# Patient Record
Sex: Male | Born: 1935 | Race: White | Hispanic: No | Marital: Married | Smoking: Never smoker
Health system: Southern US, Community
[De-identification: ages and names within clinical notes are randomized; demographics above are authoritative.]

## PROBLEM LIST (undated history)

## (undated) DIAGNOSIS — R001 Bradycardia, unspecified: Secondary | ICD-10-CM

## (undated) DIAGNOSIS — I2510 Atherosclerotic heart disease of native coronary artery without angina pectoris: Secondary | ICD-10-CM

## (undated) DIAGNOSIS — F329 Major depressive disorder, single episode, unspecified: Secondary | ICD-10-CM

## (undated) DIAGNOSIS — R7303 Prediabetes: Secondary | ICD-10-CM

## (undated) DIAGNOSIS — I739 Peripheral vascular disease, unspecified: Secondary | ICD-10-CM

## (undated) DIAGNOSIS — M1990 Unspecified osteoarthritis, unspecified site: Secondary | ICD-10-CM

## (undated) DIAGNOSIS — K219 Gastro-esophageal reflux disease without esophagitis: Secondary | ICD-10-CM

## (undated) DIAGNOSIS — N400 Enlarged prostate without lower urinary tract symptoms: Secondary | ICD-10-CM

## (undated) DIAGNOSIS — E785 Hyperlipidemia, unspecified: Secondary | ICD-10-CM

## (undated) DIAGNOSIS — I779 Disorder of arteries and arterioles, unspecified: Secondary | ICD-10-CM

## (undated) DIAGNOSIS — I209 Angina pectoris, unspecified: Secondary | ICD-10-CM

## (undated) DIAGNOSIS — F32A Depression, unspecified: Secondary | ICD-10-CM

## (undated) DIAGNOSIS — IMO0001 Reserved for inherently not codable concepts without codable children: Secondary | ICD-10-CM

## (undated) DIAGNOSIS — I10 Essential (primary) hypertension: Secondary | ICD-10-CM

## (undated) DIAGNOSIS — H269 Unspecified cataract: Secondary | ICD-10-CM

## (undated) DIAGNOSIS — F320 Major depressive disorder, single episode, mild: Secondary | ICD-10-CM

## (undated) HISTORY — PX: TONSILLECTOMY AND ADENOIDECTOMY: SUR1326

## (undated) HISTORY — DX: Disorder of arteries and arterioles, unspecified: I77.9

## (undated) HISTORY — DX: Benign prostatic hyperplasia without lower urinary tract symptoms: N40.0

## (undated) HISTORY — DX: Hyperlipidemia, unspecified: E78.5

## (undated) HISTORY — PX: BACK SURGERY: SHX140

## (undated) HISTORY — PX: EYE SURGERY: SHX253

## (undated) HISTORY — DX: Atherosclerotic heart disease of native coronary artery without angina pectoris: I25.10

## (undated) HISTORY — DX: Major depressive disorder, single episode, unspecified: F32.9

## (undated) HISTORY — PX: CAROTID STENT: SHX1301

## (undated) HISTORY — DX: Bradycardia, unspecified: R00.1

## (undated) HISTORY — DX: Peripheral vascular disease, unspecified: I73.9

## (undated) HISTORY — PX: CORONARY ANGIOPLASTY: SHX604

## (undated) HISTORY — PX: CORONARY STENT PLACEMENT: SHX1402

## (undated) HISTORY — DX: Major depressive disorder, single episode, mild: F32.0

## (undated) HISTORY — DX: Unspecified osteoarthritis, unspecified site: M19.90

## (undated) HISTORY — PX: SHOULDER ARTHROSCOPY: SHX128

---
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
## 1959-12-09 HISTORY — PX: PILONIDAL CYST EXCISION: SHX744

## 1971-12-09 HISTORY — PX: LUMBAR LAMINECTOMY: SHX95

## 1983-12-09 HISTORY — PX: INGUINAL HERNIA REPAIR: SUR1180

## 1987-12-09 HISTORY — PX: CERVICAL DISCECTOMY: SHX98

## 1993-12-08 HISTORY — PX: TRANSURETHRAL RESECTION OF PROSTATE: SHX73

## 1998-05-15 ENCOUNTER — Ambulatory Visit (HOSPITAL_COMMUNITY): Admission: RE | Admit: 1998-05-15 | Discharge: 1998-05-15 | Payer: Self-pay | Admitting: Gastroenterology

## 1998-05-17 ENCOUNTER — Ambulatory Visit (HOSPITAL_COMMUNITY): Admission: RE | Admit: 1998-05-17 | Discharge: 1998-05-17 | Payer: Self-pay | Admitting: Gastroenterology

## 1999-09-23 ENCOUNTER — Encounter: Admission: RE | Admit: 1999-09-23 | Discharge: 1999-09-23 | Payer: Self-pay | Admitting: Gastroenterology

## 1999-12-08 ENCOUNTER — Ambulatory Visit (HOSPITAL_COMMUNITY): Admission: RE | Admit: 1999-12-08 | Discharge: 1999-12-08 | Payer: Self-pay | Admitting: *Deleted

## 2000-12-08 HISTORY — PX: HERNIA REPAIR: SHX51

## 2000-12-08 HISTORY — PX: CHOLECYSTECTOMY: SHX55

## 2000-12-29 ENCOUNTER — Encounter: Payer: Self-pay | Admitting: Surgery

## 2000-12-29 ENCOUNTER — Encounter: Admission: RE | Admit: 2000-12-29 | Discharge: 2000-12-29 | Payer: Self-pay | Admitting: Surgery

## 2001-02-10 ENCOUNTER — Encounter (INDEPENDENT_AMBULATORY_CARE_PROVIDER_SITE_OTHER): Payer: Self-pay | Admitting: Specialist

## 2001-02-10 ENCOUNTER — Encounter: Payer: Self-pay | Admitting: Surgery

## 2001-02-10 ENCOUNTER — Observation Stay (HOSPITAL_COMMUNITY): Admission: RE | Admit: 2001-02-10 | Discharge: 2001-02-11 | Payer: Self-pay | Admitting: Surgery

## 2001-05-21 ENCOUNTER — Encounter (INDEPENDENT_AMBULATORY_CARE_PROVIDER_SITE_OTHER): Payer: Self-pay | Admitting: Specialist

## 2001-05-21 ENCOUNTER — Other Ambulatory Visit: Admission: RE | Admit: 2001-05-21 | Discharge: 2001-05-21 | Payer: Self-pay | Admitting: Urology

## 2002-08-26 ENCOUNTER — Inpatient Hospital Stay (HOSPITAL_COMMUNITY): Admission: EM | Admit: 2002-08-26 | Discharge: 2002-08-30 | Payer: Self-pay | Admitting: Cardiology

## 2002-08-27 ENCOUNTER — Encounter: Payer: Self-pay | Admitting: Cardiology

## 2002-09-12 ENCOUNTER — Encounter (HOSPITAL_COMMUNITY): Admission: RE | Admit: 2002-09-12 | Discharge: 2002-12-11 | Payer: Self-pay | Admitting: Cardiology

## 2005-09-30 ENCOUNTER — Inpatient Hospital Stay (HOSPITAL_COMMUNITY): Admission: AD | Admit: 2005-09-30 | Discharge: 2005-10-02 | Payer: Self-pay | Admitting: Cardiology

## 2005-10-01 ENCOUNTER — Encounter: Payer: Self-pay | Admitting: Cardiology

## 2006-07-06 ENCOUNTER — Emergency Department (HOSPITAL_COMMUNITY): Admission: EM | Admit: 2006-07-06 | Discharge: 2006-07-06 | Payer: Self-pay | Admitting: *Deleted

## 2007-02-12 ENCOUNTER — Encounter: Admission: RE | Admit: 2007-02-12 | Discharge: 2007-02-12 | Payer: Self-pay | Admitting: Neurosurgery

## 2007-08-25 ENCOUNTER — Encounter: Admission: RE | Admit: 2007-08-25 | Discharge: 2007-08-25 | Payer: Self-pay | Admitting: Cardiology

## 2007-12-16 ENCOUNTER — Encounter: Admission: RE | Admit: 2007-12-16 | Discharge: 2008-01-13 | Payer: Self-pay | Admitting: Neurology

## 2009-02-06 ENCOUNTER — Ambulatory Visit (HOSPITAL_COMMUNITY): Admission: RE | Admit: 2009-02-06 | Discharge: 2009-02-07 | Payer: Self-pay | Admitting: Cardiology

## 2009-02-06 HISTORY — PX: CARDIAC CATHETERIZATION: SHX172

## 2009-08-19 ENCOUNTER — Encounter: Admission: RE | Admit: 2009-08-19 | Discharge: 2009-08-19 | Payer: Self-pay | Admitting: Orthopedic Surgery

## 2010-10-25 ENCOUNTER — Ambulatory Visit: Payer: Self-pay | Admitting: Cardiology

## 2010-10-30 ENCOUNTER — Ambulatory Visit: Payer: Self-pay | Admitting: Cardiology

## 2010-11-12 ENCOUNTER — Telehealth (INDEPENDENT_AMBULATORY_CARE_PROVIDER_SITE_OTHER): Payer: Self-pay | Admitting: *Deleted

## 2010-11-13 ENCOUNTER — Ambulatory Visit: Payer: Self-pay

## 2010-11-13 ENCOUNTER — Encounter: Payer: Self-pay | Admitting: Cardiology

## 2010-11-13 ENCOUNTER — Encounter (HOSPITAL_COMMUNITY)
Admission: RE | Admit: 2010-11-13 | Discharge: 2011-01-07 | Payer: Self-pay | Source: Home / Self Care | Attending: Cardiology | Admitting: Cardiology

## 2010-11-13 ENCOUNTER — Encounter: Payer: Self-pay | Admitting: *Deleted

## 2011-01-07 NOTE — Assessment & Plan Note (Signed)
Summary: Cardiology Nuclear Testing  Nuclear Med Background Indications for Stress Test: Evaluation for Ischemia, Stent Patency   History: Echo, Heart Catheterization, Myocardial Perfusion Study, Stents  History Comments: '03 Stent-OM; '10 ZOX:[id number] stent, n/o CAD  Symptoms: Chest Pain, Chest Pain with Exertion    Nuclear Pre-Procedure Caffeine/Decaff Intake: None NPO After: 6:00 PM Lungs: clear IV 0.9% NS with Angio Cath: 20g     IV Site: L Antecubital IV Started by: Irean Hong, RN Chest Size (in) 48     Height (in): 76 Weight (lb): 246 BMI: 30.05 Tech Comments: Held toprol 24 hrs.  Nuclear Med Study 1 or 2 day study:  1 day     Stress Test Type:  Stress Reading MD:  [name], MD     Referring MD:  T.Brackbill Resting Radionuclide:  Technetium 85m Tetrofosmin     Resting Radionuclide Dose:  11.0 mCi  Stress Radionuclide:  Technetium 3m Tetrofosmin     Stress Radionuclide Dose:  33.0 mCi   Stress Protocol Exercise Time (min):  5:30 min     Max HR:  150 bpm     Predicted Max HR:  148 bpm  Max Systolic BP: 220 mm Hg     Percent Max HR:  101.35 %     METS: 7.0 Rate Pressure Product:  95621    Stress Test Technologist:  Milana Na, EMT-P     Nuclear Technologist:  Domenic Polite, CNMT  Rest Procedure  Myocardial perfusion imaging was performed at rest 45 minutes following the intravenous administration of Technetium 13m Tetrofosmin.  Stress Procedure  The patient exercised for 5:30. The patient stopped due to fatigue and chest pain.  There were no significant ST-T wave changes.  Technetium 77m Tetrofosmin was injected at peak exercise and myocardial perfusion imaging was performed after a brief delay.  QPS Raw Data Images:  Normal; no motion artifact; normal heart/lung ratio. Stress Images:  Normal homogeneous uptake in all areas of the myocardium. Rest Images:  Normal homogeneous uptake in all areas of the myocardium. Subtraction (SDS):   No evidence of ischemia. Transient Ischemic Dilatation:  1.03  (Normal <1.22)  Lung/Heart Ratio:  .31  (Normal <0.45)  Quantitative Gated Spect Images QGS EDV:  119 ml QGS ESV:  50 ml QGS EF:  58 % QGS cine images:  No wall motion abnormalities  Findings Normal nuclear study Clinically Abnormal (chest pain, ST abnormality, hypotension)      Overall Impression  Exercise Capacity: Fair exercise capacity. BP Response: Hypertensive blood pressure response. Clinical Symptoms: Mild chest pain/dyspnea. ECG Impression: No significant ST segment change suggestive of ischemia. Overall Impression: Normal stress nuclear study. Overall Impression Comments: Hypertensive response to exercise.  Appended Document: Cardiology Nuclear Testing COPY TO [name]

## 2011-01-07 NOTE — Progress Notes (Signed)
Summary: Nuclear pre procedure  Phone Note Outgoing Call Call back at Home Phone [phone]   Call placed by: Rea College, CMA,  November 12, 2010 3:41 PM Call placed to: Patient Summary of Call: Reviewed information on [employer] Information Sheet (see scanned document for further details).  Spoke with patient.      Nuclear Med Background Indications for Stress Test: Evaluation for Ischemia, Stent Patency   History: Echo, Heart Catheterization, Myocardial Perfusion Study, Stents  History Comments: '03 Stent-OM; '10 [id number] stent, n/o CAD  Symptoms: Chest Pain, Chest Pain with Exertion

## 2011-03-06 ENCOUNTER — Ambulatory Visit: Payer: Self-pay | Admitting: Cardiology

## 2011-03-13 ENCOUNTER — Other Ambulatory Visit: Payer: Self-pay | Admitting: *Deleted

## 2011-03-13 DIAGNOSIS — E7800 Pure hypercholesterolemia, unspecified: Secondary | ICD-10-CM

## 2011-03-13 DIAGNOSIS — Z79899 Other long term (current) drug therapy: Secondary | ICD-10-CM

## 2011-03-17 ENCOUNTER — Other Ambulatory Visit (INDEPENDENT_AMBULATORY_CARE_PROVIDER_SITE_OTHER): Payer: Medicare Other | Admitting: *Deleted

## 2011-03-17 DIAGNOSIS — E7800 Pure hypercholesterolemia, unspecified: Secondary | ICD-10-CM

## 2011-03-17 DIAGNOSIS — Z79899 Other long term (current) drug therapy: Secondary | ICD-10-CM

## 2011-03-17 LAB — BASIC METABOLIC PANEL
BUN: 19 mg/dL (ref 6–23)
CO2: 25 mEq/L (ref 19–32)
Chloride: 105 mEq/L (ref 96–112)
Creatinine, Ser: 0.8 mg/dL (ref 0.4–1.5)
GFR: 99.39 mL/min (ref >60.00)
Glucose, Bld: 101 mg/dL — ABNORMAL HIGH (ref 70–99)
Potassium: 4.1 mEq/L (ref 3.5–5.1)
Sodium: 135 mEq/L (ref 135–145)

## 2011-03-17 LAB — LIPID PANEL
Cholesterol: 118 mg/dL (ref 0–200)
HDL: 38.7 mg/dL — ABNORMAL LOW (ref >39.00)
LDL Cholesterol: 67 mg/dL (ref 0–99)
VLDL: 12.2 mg/dL (ref 0.0–40.0)

## 2011-03-17 LAB — HEPATIC FUNCTION PANEL
ALT: 17 U/L (ref 0–53)
AST: 18 U/L (ref 0–37)
Albumin: 3.7 g/dL (ref 3.5–5.2)
Alkaline Phosphatase: 52 U/L (ref 39–117)
Bilirubin, Direct: 0.1 mg/dL (ref 0.0–0.3)
Total Bilirubin: 0.7 mg/dL (ref 0.3–1.2)
Total Protein: 6.1 g/dL (ref 6.0–8.3)

## 2011-03-19 ENCOUNTER — Encounter: Payer: Self-pay | Admitting: *Deleted

## 2011-03-19 ENCOUNTER — Encounter: Payer: Self-pay | Admitting: Cardiology

## 2011-03-19 ENCOUNTER — Ambulatory Visit (INDEPENDENT_AMBULATORY_CARE_PROVIDER_SITE_OTHER): Payer: Medicare Other | Admitting: Cardiology

## 2011-03-19 DIAGNOSIS — R5383 Other fatigue: Secondary | ICD-10-CM

## 2011-03-19 DIAGNOSIS — E785 Hyperlipidemia, unspecified: Secondary | ICD-10-CM

## 2011-03-19 DIAGNOSIS — I259 Chronic ischemic heart disease, unspecified: Secondary | ICD-10-CM

## 2011-03-19 DIAGNOSIS — N400 Enlarged prostate without lower urinary tract symptoms: Secondary | ICD-10-CM | POA: Insufficient documentation

## 2011-03-19 DIAGNOSIS — R5381 Other malaise: Secondary | ICD-10-CM | POA: Insufficient documentation

## 2011-03-19 HISTORY — DX: Other malaise: R53.81

## 2011-03-19 HISTORY — DX: Benign prostatic hyperplasia without lower urinary tract symptoms: N40.0

## 2011-03-19 HISTORY — DX: Chronic ischemic heart disease, unspecified: I25.9

## 2011-03-19 NOTE — Assessment & Plan Note (Signed)
The patient remains on low dose Lipitor 10 mg daily.  His not having any myalgias or side effects from the Lipitor.  Blood work today is pending

## 2011-03-19 NOTE — Assessment & Plan Note (Signed)
The patient has known coronary artery disease.  He underwent stenting of his first obtuse marginal vessel in September 2003.  In March 2010 he had an abnormal stress Cardiolite and a repeat cardiac catheter showed long-term patency of the prior stent and no new blockage.  Left ventricular function was normal.  The patient states he's having no recurrent chest pain.  He's not having any increased dyspnea or any palpitations dizziness or syncope.

## 2011-03-19 NOTE — Assessment & Plan Note (Signed)
The patient complains of being tired all the time.  He thinks he feels worse since we switched him from metoprolol succinate to metoprolol tartrate.  He has also been under more stress dealing with family issues including helping settle the estate of his mother who died at age 73 over the winter.  In regard to his metoprolol we will have him start to take it at bedtime rather than in the morning and see if this makes a difference.  If not we may need to switch back to metoprolol succinate.  He will also work harder on weight loss and aerobic exercise

## 2011-03-19 NOTE — Progress Notes (Signed)
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  The patient does have known ischemic heart disease and he had stenting of his first obtuse marginal vessel in September 2003.  He had a repeat cardiac catheterization in March 2010 after an abnormal stress Cardiolite but the cardiac catheterization showed long-term patency of the prior stent and nonobstructive atherosclerotic disease elsewhere and he had normal left ventricular function.  The patient has had no recent chest discomfort his last nuclear stress test was 11/13/10 and showed fair exercise capacity, hypertensive blood pressure response, no ischemia and ejection fraction normal at 58%.  Current Outpatient Prescriptions  Medication Sig Dispense Refill  . aspirin 81 MG tablet Take 81 mg by mouth daily.        [name] atorvastatin (LIPITOR) 10 MG tablet Take 10 mg by mouth daily.        . clopidogrel (PLAVIX) 75 MG tablet Take 75 mg by mouth daily. takes 3-4 times/week       . diazepam (VALIUM) 5 MG tablet Take 5 mg by mouth every 12 (twelve) hours as needed.        . finasteride (PROSCAR) 5 MG tablet Take 5 mg by mouth daily.        . metoprolol succinate (TOPROL-XL) 25 MG 24 hr tablet Take 25 mg by mouth daily. Taking toprol tartrate 25mg .  One half daily      . omeprazole (PRILOSEC) 20 MG capsule Take 20 mg by mouth daily.        . ramipril (ALTACE) 2.5 MG capsule Take 2.5 mg by mouth daily.        [name] zolpidem (AMBIEN) 5 MG tablet Take 5 mg by mouth at bedtime as needed.          Allergies  Allergen Reactions  . Methocarbamol     REACTION: Reaction not known  . Orphenadrine Citrate     REACTION: Reaction not known    Patient Active Problem List  Diagnoses  . Ischemic heart disease  . Dyslipidemia  . BPH (benign prostatic hyperplasia)  . Malaise and fatigue    History  Smoking status  . Never Smoker   Smokeless tobacco  . Never Used    History  Alcohol Use  . Yes    No family history on  file.  Review of Systems: The patient denies any heat or cold intolerance.  No weight gain or weight loss.  The patient denies headaches or blurry vision.  There is no cough or sputum production.  The patient denies dizziness.  There is no hematuria or hematochezia.  The patient denies any muscle aches or arthritis.  The patient denies any rash.  The patient denies frequent falling or instability.  There is no history of depression or anxiety.  All other systems were reviewed and are negative.   Physical Exam: Filed Vitals:   03/19/11 0918  BP: 110/70  Pulse: 64  Weight is 256, up 9 pounds.  The general appearance is that of large well-developed well-nourished gentleman in no distress.Pupils equal and reactive.   Extraocular Movements are full.  There is no scleral icterus.  The mouth and pharynx are normal.  The neck is supple.  The carotids reveal no bruits.  The jugular venous pressure is normal.  The thyroid is not enlarged.  There is no lymphadenopathy.The chest is clear to percussion and auscultation. There are no rales or rhonchi. Expansion of the chest is symmetrical.The precordium is quiet.  The  first heart sound is normal.  The second heart sound is physiologically split.  There is no murmur gallop rub or click.  There is no abnormal lift or heave.The abdomen is soft and nontender. Bowel sounds are normal. The liver and spleen are not enlarged. There Are no abdominal masses. There are no bruits.The pedal pulses are good.  There is no phlebitis or edema.  There is no cyanosis or clubbing.Strength is normal and symmetrical in all extremities.  There is no lateralizing weakness.  There are no sensory deficits.    Assessment / Plan: Continue same medication except switch the timing of his metoprolol tartrate 2 bedtime.  Recheck in 6 months for followup office visit lipid panel and chemistries.

## 2011-04-22 NOTE — H&P (Signed)
Jared Clark, Jared Clark NO.:  [id number]   MEDICAL RECORD NO.:  [id number]           PATIENT TYPE:   LOCATION:                                 FACILITY:   PHYSICIAN:  [name], [name]       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  He is status post  stenting of the first obtuse marginal vessel in September 2003 with a  2.5 x 13-mm Cypher stent.  He has done very well since then.  Recently,  he has been experiencing symptoms of chest pain localized to the left  mid axillary line.  Interestingly, his symptoms were predominantly at  rest, but he has noticed that as well with some walking.  He has had no  shortness of breath, diaphoresis, nausea, or vomiting.  To further  evaluate his symptoms, he underwent a stress Cardiolite study on  February 02, 2009.  He exercised on the Bruce protocol to 6 minutes with  an adequate heart rate and blood pressure response.  He had no chest  pain and there was no definite ischemic ECG changes.  Subsequent  Cardiolite images, however, demonstrated reversible inferior wall defect  felt to be consistent with ischemia and had a normal ejection fraction  of 57%.  Given these findings, it was recommended he undergo cardiac  catheterization and he is admitted for this process.   PAST MEDICAL HISTORY:  1. Coronary artery disease as noted above.  2. He has a history of hyperlipidemia.  3. He is status post T and A, lumbar laminectomy, and removal of      pilonidal cyst in 1961.  4. Previous hernia repair.   He is allergic to NORFLEX and ROBAXIN.   CURRENT MEDICATIONS:  1. Proscar 5 mg at bedtime.  2. Aspirin 81 mg per day.  3. Plavix 75 mg daily.  4. Lipitor 10 mg daily.  5. Prilosec over-the-counter daily.  6. Toprol-XL 12.5 mg daily.  7.  Ramipril 2.5 mg daily.  8. Ambien p.r.n.   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  He  is married.  He has one son.  He denies tobacco or alcohol use.   FAMILY HISTORY:  His father died at age 38 with lung cancer.  There is a  family history of stroke.  There is no family history of coronary artery  disease.   REVIEW OF SYSTEMS:  He denies any claudication.  He has had no  palpitations, edema, orthopnea, or PND.  He has had problems with  vertigo in the past, but none recently.  He has had prior history of  gynecomastia.  All other systems are reviewed and are negative.   PHYSICAL EXAMINATION:  GENERAL:  The patient is a pleasant white male in  no apparent distress.  VITAL SIGNS:  Weight is 259, blood pressure is 152/88, pulse is 60  and  regular, and respirations were normal.  HEENT:  He is normocephalic and atraumatic.  His pupils are equal,  round, and reactive.  Sclerae clear.  Oropharynx is clear.  NECK:  Without JVD, adenopathy, thyromegaly, or bruits.  LUNGS:  Clear to auscultation and percussion.  CARDIAC:  Regular rate and rhythm without gallop, murmur, rub, or click.  ABDOMEN:  Soft and nontender without hepatosplenomegaly.  Bowel sounds  are positive.  EXTREMITIES:  Femoral and pedal pulses are 2+ and symmetric.  He has no  edema or cyanosis.  SKIN:  Warm and dry.  NEUROLOGIC:  He is alert and oriented x4.  Cranial nerves II through XII  are intact.  He has no motor deficits.   LABORATORY DATA:  His ECG at rest shows normal sinus rhythm with a  normal ECG.  His chest x-ray shows no active disease.   IMPRESSION:  1. Chest pain with stress Cardiolite study suggesting inferior wall      ischemia.  2. Remote stenting of the first obtuse marginal vessel with a drug-      eluting stent.  3. Hyperlipidemia.  4. Benign prostatic hypertrophy.  5. Hypertension.   PLAN:  Proceed with diagnostic cardiac catheterization with potential   intervention if indicated.           ______________________________  [name], [name]     PMJ/MEDQ  D:  02/05/2009  T:  02/06/2009  Job:  [id number]   cc:   [name], [name]  [name], [name]

## 2011-04-22 NOTE — Cardiovascular Report (Signed)
Jared Clark, Jared Clark NO.:  [id number]   MEDICAL RECORD NO.:  [id number]          PATIENT TYPE:  OIB   LOCATION:  3714                         FACILITY:  [facility]   PHYSICIAN:  [name], [name]  DATE OF BIRTH:  03-26-1936   DATE OF PROCEDURE:  02/06/2009  DATE OF DISCHARGE:                            CARDIAC CATHETERIZATION   INDICATIONS FOR PROCEDURE:  The patient is a 75 year old white male with  history of coronary artery disease status post stenting of the first  obtuse marginal vessel in September 2003.  He has had recent atypical  chest pain.  His stress Cardiolite study was suggestive for inferior  wall ischemia.   PROCEDURE:  Left heart catheterization, coronary and left ventricular  angiography.   EQUIPMENT USED:  A 6-French 4 cm right and left Judkins catheter, 6-  French pigtail catheter, and 6-French arterial sheath.   MEDICATIONS:  Versed 2 mg IV and local anesthesia 1% Xylocaine.   CONTRAST:  Omnipaque 115 mL.   HEMODYNAMIC DATA:  The aortic pressure was 118/60 with a mean of 84  mmHg, left ventricular pressure was 117 with an EDP of 13 mmHg.   ANGIOGRAPHIC DATA:  The left coronary artery arises and distributes  normally.  The left main coronary artery is normal.   Left anterior descending artery has minor irregularities less than 10%.  There are two diagonal vessels which arise high on the LAD prior to the  first septal perforator.  The first of these diagonal branches has  approximately 30% irregularities.   Left circumflex coronary is a codominant vessel.  The first marginal  vessel of the previous stent site is widely patent.  There is no  significant disease noted in this vessel.  The remainder of the  circumflex has only minor irregularities less than 10%.   The right coronary artery is a codominant vessel supplying only a PDA.  There are sequential 20% narrowings in the midvessel.  In the distal  vessel, there is a focal  eccentric 40-50% stenosis.   Left ventricular angiography was performed in the RAO view.  This  demonstrates normal left ventricular size and contractility with normal  systolic function.  Ejection fraction is estimated at 60%.  There was no  mitral regurgitation or prolapse.   Abdominal aortography was performed.  This demonstrates normal aortic  caliber with no evidence of aneurysmal dilatation.  The iliac arteries  appear normal.  The renal arteries appear normal.   IMPRESSION:  1. Nonobstructive atherosclerotic coronary artery disease.  2. Continued long-term patency of the prior stent in the first obtuse      marginal vessel.  3. Normal left ventricular function.   PLAN:  Would recommend continued medical therapy.           ______________________________  [name], [name]     PMJ/MEDQ  D:  02/06/2009  T:  02/07/2009  Job:  [id number]   cc:   [name], [name]  [name], [name]

## 2011-04-25 NOTE — Procedures (Signed)
EEG NUMBER:  [id number]   HISTORY OF PRESENT ILLNESS:  This is a 75 year old patient with episode of  loss of consciousness.  The patient is being evaluated for possible  seizures.  This is a routine EEG.  No skull defects are noted.   MEDICATIONS:  1.  Ambien.  2.  Tylenol.  3.  Folic acid.  4.  Toprol.  5.  Diazepam.   EEG CLASSIFICATION:  Normal awake and drowsy.   DESCRIPTION OF RECORDING WITH BACKGROUND RHYTHM:  This recording consisted  of a fairly well modulated medium amplitude alpha rhythm of 8-10 Hertz that  is reactive to eye opening and closure.  As the record progresses, the  patient seems to drift in and out of the drowsy state throughout the  recording, never clearly entering stage II sleep at any time.  During  periods of drowsiness, there is a 7 Hertz theta frequency slowing seen.  Photic stimulation is performed resulting in a bilateral and symmetrical  flow driving response.  Hyperventilation was not performed.  At no time  during the recording did there appear to be evidence of spike or spike wave  discharges or evidence of focal slowing.  EKG monitor sows no evidence of  cardiac rhythm abnormalities with heart rate of 78.   IMPRESSION:  This is a normal EEG recording in the awake and drowsy state.  No evidence of ictal or inner ictal discharges were seen.      Marlan Palau, [name]  Electronically Signed     ONG:EXBM  D:  10/01/2005 11:22:52  T:  10/01/2005 84:13:24  Job #:  [id number]

## 2011-04-25 NOTE — Discharge Summary (Signed)
NAME:  Jared Clark, Jared Clark                        ACCOUNT NO.:  [id number]   MEDICAL RECORD NO.:  [id number]                   PATIENT TYPE:  INP   LOCATION:  6525                                 FACILITY:  [facility]   PHYSICIAN:  [name], MD                DATE OF BIRTH:  [birth year]   DATE OF ADMISSION:  08/26/2002  DATE OF DISCHARGE:  08/30/2002                                 DISCHARGE SUMMARY   FINAL DIAGNOSES:  1. Chest pain.  2. Coronary atherosclerosis of native coronary vessel.  3. Essential hypertension.   OPERATION:  Cardiac catheterization with single vessel percutaneous  transluminal coronary angioplasty and insertion of a drug eluding coronary  artery stent by Dr. Georga Hacking on 08/29/02.   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 Lacretia Nicks.  Viann Fish because of chest pain.  The patient has a history of  hypertension, obesity, and hiatal hernia reflux, and benign prostatic  hypertrophy.  He has had the onset of left chest pain during the summer.  He  had a 2-D Cardiolite earlier this week which showed no definite reversible  ischemia, but did have an unexplainably low ejection fraction of 25% with a  question of a fixed inferior wall defect.  He did have chest pain during the  peak of exercise on the treadmill associated with the stress Cardiolite,  although there were no electrocardiographic changes to correspond with the  chest pain.  Since the stress test, the patient has continued to have  intermittent left chest pain as well as fatigue.  The pain lasts anywhere  from 10 to 30 seconds at a time, and occasionally has occurred with  exertion.  He had further pain this evening and called and spoke with Dr.  Donnie Aho who advised him to come in to be admitted for possible unstable  angina.   PHYSICAL EXAMINATION:  VITAL SIGNS:  Blood pressure 145/80.  GENERAL:  This was an obese Caucasian gentleman in no acute distress.  LUNGS:  Clear.  HEART:  No S3, no bruits.  ABDOMEN:  Soft, nontender.  Pulses were good.   LABORATORY DATA:  His electrocardiogram showed no acute changes.   HOSPITAL COURSE:  The patient was admitted, placed on telemetry, placed on  Lovenox, aspirin, and beta blockers.  The patient had serial enzymes drawn  to rule out a myocardial infarction.  By the following day, he showed  evidence of a change in his electrocardiogram with the EKG on 08/27/02  showing Q-waves in 2, 3, and aVF, and a loss of Q-waves in 1 and aVL.  This  appeared to be a variable degree of interventricular conduction disturbance.  The patient remained stable over the weekend, and it was felt that he should  undergo diagnostic cardiac catheterization.  This was accomplished on  08/29/02 by Dr. Georga Hacking.  This showed an ejection fraction of 50 to  55% and no wall motion abnormalities.  He was found to have calcium with  mild irregularities in the left anterior descending, a 40% diagonal lesion,  a 95% first obtuse marginal of the circumflex lesion which was codominant,  and a 30 to 40% mid right coronary artery lesion.  It was felt that the  culprit lesion was the obtuse marginal #1, and this was successfully  predilated, and then a drug eluding stent was placed.  The stenosis was  reduced from 95% to 0.  The patient tolerated the procedure well.  Later  that evening, however, he did complain of an uncomfortable feeling in his  left chest, requested a nitroglycerin which was given, and after  approximately 20 minutes the patient noted relief of pain.  By 08/30/02, the  patient was stable enough to be discharged home.  Lipitor was added to his  regimen because of his pulmonary artery disease.   LABORATORY DATA:  Hemoglobin 14, hematocrit 40.9.  Clotting studies are  normal.  CMET was normal.  BUN 19, blood sugar 105.  CK-MB's were negative   x3.  Troponin-I's were negative x2.   The patient was discharged home on 08/30/02.   DISCHARGE MEDICATIONS:  1. Lopressor 50 mg 1/2 tablet b.i.d.  2. Plavix 75 mg q.d.  3. Ecotrin 81 mg q.d.  4. Lipitor 10 mg q.d. in the p.m.  5. Prilosec over-the-counter one q.d.  6. Foltx 2.5 mg one q.d.  7. Proscar 5 mg q.d.  8. Nitrostat 150 mg sublingually p.r.n.   ACTIVITY:  The patient is to walk as tolerated.  He is to avoid squatting or  straining for the next several days to protect the groin from a hematoma.   DIET:  He will be on a low cholesterol diet.   FOLLOWUP:  He will return in two weeks for an office visit, and he will call  for an appointment.   CONDITION ON DISCHARGE:  Improved.                                                [name], MD    TB/MEDQ  D:  09/14/2002  T:  09/18/2002  Job:  [id number]

## 2011-04-25 NOTE — Cardiovascular Report (Signed)
NAME:  Jared Clark, Jared Clark                        ACCOUNT NO.:  [id number]   MEDICAL RECORD NO.:  [id number]                   PATIENT TYPE:  INP   LOCATION:  6525                                 FACILITY:  [facility]   PHYSICIAN:  W. Ashley Royalty., [name]         DATE OF BIRTH:  June 09, 1936   DATE OF PROCEDURE:  08/29/2002  DATE OF DISCHARGE:                              CARDIAC CATHETERIZATION   HISTORY:  The patient is a 75 year old male with hypertension who has a  diagnosis of possible unstable angina.   DESCRIPTION OF PROCEDURE:  The patient was brought to the cardiac  catheterization laboratory and was prepped and draped in the usual manner.  After Xylocaine anesthesia, a 6 French sheath was placed in the right  femoral artery percutaneously.  Angiograms were done using 6 French  catheters and a 25 cc ventriculogram was performed.  A distal aortic  injection was performed. Arrangements were made to do angioplasty. The  angioplasty equipment used was a 6 [place] guiding catheter, HTF-J guide wire  which crossed the lesion easily.  Angiomax was given with double bolus in an  infusion. IV nitroglycerin was begun.  The guide wire crossed the lesion  easily and the lesion was pre-dilated with a 2.5 x 10 mm Raptorail balloon.  Following this, a 13 mm x 2.5 mm Cypher drug-eluting stent was deployed to  14 atmospheres.  The lesion was then post-dilated with the same 2.5 x 10 mm  Raptorail. Intracoronary nitroglycerin was given at the conclusion of the  procedure and post-dilatation angiograms were performed. He tolerated the  procedure well.   HEMODYNAMIC DATA:  Aorta post-contrast 105/70, LV post-contrast 105/5.   ANGIOGRAPHIC DATA:   LEFT VENTRICULOGRAM:  Left ventriculogram performed in the 30-degree RAO  projection.  The aortic valve was normal. The mitral valve was normal. The  left ventricle appears normal in size.  The estimated ejection fraction is  50-55%.  Distal aortic  injection shows both renal arteries to be patent.  There was minimal atherosclerotic disease.   CORONARY ARTERIES:  Arise and distribute normally.  The system is  codominant.  There is calcification noted in both coronary arteries of a  mild to moderate degree.   Left main coronary artery:  The left main coronary artery appears normal.   Left anterior descending:  Left anterior descending is patent.  There is  mild to moderate proximal irregularity noted. The diagonal branch has 30-40%  stenosis in its proximal portion.   The circumflex coronary artery:  Large codominant vessel, supplies a first  marginal artery and then a series of three posterolateral branches. The  first marginal artery has an eccentric 95% stenosis involving the proximal  portion of the vessel.   The right coronary artery:  Codominant vessel ending in a posterior  descending artery.  There is a 30-40% mid to distal stenosis which is  slightly eccentric. Post-dilatation angiograms of the circumflex coronary  artery revealed 0% residual stenosis and preservation of all side branches.   IMPRESSION:  1. Successful stenting of the circumflex marginal artery with stenosis going     from 95% to 0%.  2.     Mild to moderate atherosclerotic irregularity involving the left anterior     descending and the right coronary artery.  3. Normal left ventricular function, normal left ventricular end-diastolic     pressure.                                                    Darden Palmer., [name]    WST/MEDQ  D:  08/29/2002  T:  08/30/2002  Job:  [id number]   cc:   Thomas A. Patty Sermons, [name]

## 2011-04-25 NOTE — H&P (Signed)
NAME:  Jared Clark, Jared Clark                        ACCOUNT NO.:  [id number]   MEDICAL RECORD NO.:  [id number]                   PATIENT TYPE:  INP   LOCATION:  2002                                 FACILITY:  [facility]   PHYSICIAN:  Darden Palmer., [name]         DATE OF BIRTH:  1936-04-23   DATE OF ADMISSION:  08/26/2002  DATE OF DISCHARGE:                                HISTORY & PHYSICAL   REASON FOR ADMISSION:  Chest discomfort.   HISTORY OF PRESENT ILLNESS:  The patient is a 75 year old male admitted for  evaluation of chest pain and to rule out a myocardial infarction.  The  patient has a history of obesity, also has hypertension.  He had some  atypical chest pain over the years and also had a headache several years ago  at which time his diagnosis of hypertension was noted.  He had significant  fatigue on Ziac. He was later switched to Hyzaar.  He had an episode in  09/2001 when he, while driving to visit his elderly mother in [place],  drank a fair bit of caffeinated beverages and had some palpitations and was  found to have frequent PACs on Holter monitoring.  He, this summer, had an  episode of severe precordial chest pain radiating up into his right upper  arm and lasted around five minutes and awakened him from sleep.  He  developed nagging left-sided chest discomfort in August which happened when  he was playing in a golf tournament.  It began to occur more frequently,  began to occur at rest and was described as left-sided pressure.  It would  tend to last for variable periods of time and occasionally be associated  with activity such as climbing stairs but not always consistently.  He was  treated by Dr. Kinnie Scales with Nexium for what was presumed to be reflux which  caused somewhat of a cough previously.  He saw [name] on 08/15/2002 and  had a two-day Cardiolite which was just completed today.  His exercise  tolerance was only fair.  He wound up having an  ejection fraction of only  25%.  On review of the images, he appeared to have an inferior wall defect  which may have been attenuation or could have been a previous infarction.   [name] talked with him this afternoon, and he recommended the patient  take Plavix and have a catheterization arranged for later next week.  The  patient called this evening with questions about whether or not he should  take Plavix after reading the package inserts and also commented that he was  continuing to have some chest discomfort.  He would have it with some stress  or anxiety as well as with some walking. It also would occur at rest which  would come and go spontaneously.  Because he has continued to have chest  discomfort with the abnormal Cardiolite scan, he is admitted  at this time  for further evaluation of chest pain to rule out MI.   PAST MEDICAL HISTORY:  Benign prostatic hypertrophy.  He has had previous  prostate biopsies.  He has a history of PACs, prior history of hypertension.  He has mild hyperlipidemia with a slightly low HDL cholesterol.  He has a  history of hiatal hernia and also has some osteoarthritis.   PAST SURGICAL HISTORY:  1. Tonsillectomy.  2. Lumbar laminectomy in 1970s.  3. Pilonidal cyst in 1961.  4. Hernia repair.  5. Cholecystectomy.  6. Bilateral shoulder arthroscopy.  7. Repair of umbilical hernia.   ALLERGIES:  NORFLEX and ROBAXIN.    CURRENT MEDICATIONS:  1. Proscar 5 mg q.h.s.  2. Baby aspirin daily.  3. Hyzaar 50/12.5 daily.  4. Nexium40 mg daily.  5. Aleve q.h.s.  6. Folic acid 1 mg daily for an elevated homocystine level.   FAMILY HISTORY:  His elderly mother is still living, had a light heart  attack in her 46s.  Father is deceased.  He has two twin brother who are in  fair health.  He has one son who works as a Sport and exercise psychologist.  His family  history is positive for hypertension and diabetes.   SOCIAL HISTORY:  He is married, had one son who  works in the Health visitor.  He is a nonsmoker, drinks alcohol only socially.  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 He had retired approximately three years ago but has recently  come out of retirement and is doing locum tennens type work.   REVIEW OF SYSTEMS:  He has been obese for many years. He has really been  unable to lose any weight.  He gets only a mild amount of exercise.  Does  have occasional symptoms of indigestion.  He does have some terminal  dribbling and has had a previous TURP.  Other than as noted above, the  remainder of the Review of Systems is unremarkable.   PHYSICAL EXAMINATION:  GENERAL:  He is an obese, pleasant male who is  talkative and in no acute distress.  VITAL SIGNS:  Blood pressure currently 145/80, pulse 70.  SKIN:  Warm and dry.  HEENT:  EOMI.  PERRLA.  Conjunctivae and sclerae clear.  Pharynx negative.  NECK:  Supple without masses, JVD, thyromegaly, or bruits.  LUNGS:  Clear to auscultation and percussion.  CARDIAC:  Normal S1 and S2.  No S3.  ABDOMEN: Soft, nontender.  No bruits.  EXTREMITIES:  Femoral pulses 2+, peripheral pulses 2+. No edema noted.   LABORATORY DATA:  A 12-lead electrocardiogram shows horizontal axis but no  acute abnormality.   Laboratory data is all pending at the time of dictation.   IMPRESSION:  1. Chest discomfort with features suggestive of unstable angina pectoris. He     had an abnormal Cardiolite scan both clinically and showing a reduced     ejection fraction of 25%.  2. Hypertension heart disease.  3. Elevated homocystine level previously.  4. History of hiatal hernia with reflux.    RECOMMENDATIONS:  Admit to telemetry unit.  Begin Lovenox, beta blockers,  and aspirin.  Do followup EKG and serial enzymes. We will try to stabilize  him over the weekend, and he may need to have a cardiac catheterization done  this admission.  Darden Palmer.,  [name]   WST/MEDQ  D:  08/27/2002  T:  08/27/2002  Job:  [id number]   cc:   Thomas A. Patty Sermons, [name]

## 2011-04-25 NOTE — Discharge Summary (Signed)
Jared Clark, Jared Clark              ACCOUNT NO.:  [id number]   MEDICAL RECORD NO.:  [id number]          PATIENT TYPE:  INP   LOCATION:  2004                         FACILITY:  [facility]   PHYSICIAN:  Jared Clark, [name] DATE OF BIRTH:  November 29, 1936   DATE OF ADMISSION:  09/30/2005  DATE OF DISCHARGE:  10/02/2005                                 DISCHARGE SUMMARY   DISCHARGE DIAGNOSES:  1.  Syncope, uncertain etiology, probably vasovagal.  2.  Laceration of chin secondary to syncope.  3.  Coronary artery disease with prior coronary artery stent.  4.  Hypertensive cardiovascular disease.  5.  Hypercholesterolemia.  6.  Asymptomatic right internal carotid artery stenosis, 60% to 80%.   OPERATIONS PERFORMED:  1.  CT scan of the head.  2.  Two-dimensional echocardiogram.  3.  Carotid Dopplers.   HISTORY:  Jared Clark is a 74 year old Caucasian gentleman with a history of  known coronary artery disease, hypertension and hyperlipidemia.  He  presented to [facility] on September 30, 2005 after having  episode of syncope at home.  He had been standing in the kitchen for a while  talking with a friend, who then departed.  Shortly after the friend's  departure, the patient began to feel weak, nauseated and diaphoretic.  He  went to the bathroom and thought he was going to have a bowel movement and  thought he might vomit.  He felt lightheaded.  He went back to the living  room to try to lie down on the sofa and had syncope while trying to get to  the sofa.  Unfortunately, he fell against the hearth of the fireplace and  suffered a laceration of his chin.  He also has an abrasion of his right  leg.  He did not have any chest discomfort.  There was no loss of bowel or  bladder function to suggest a seizure.  He was brought to the emergency  room, where his blood pressure was 112/70.   His past history reveals that he had a CYPHER stent to the obtuse marginal  in September 2003.   His last Cardiolite was March 2006 and he had an  ejection fraction of 57% and no ischemia.   PREVIOUS HISTORY:  Included hypertension, hypercholesterolemia, obesity,  BPH, premature atrial contractions, hiatal hernia and osteoarthritis.   PAST SURGERIES:  Includes tonsillectomy, lumbar laminectomy, pilonidal cyst,  hernia repair, cholecystectomy, bilateral shoulder arthroscopy.   HOME MEDICATIONS:  1.  Proscar 5 mg daily.  2.  Aspirin 81 mg daily.  3.  Plavix 75 mg daily.  4.  Lipitor 10 mg daily.  5.  Prilosec 20 mg daily.  6.  Folic acid 1 mg daily.  7.  Toprol-XL 25 mg daily.   PHYSICAL EXAMINATION:  VITAL SIGNS:  Physical exam on admission showed a  blood pressure 123/70, pulse is 80 and normal sinus rhythm.  LUNGS:  Clear.  HEART:  Reveals no gallop.  The carotids revealed no audible bruit.  EXTREMITIES:  Reveal abrasion of the right leg.   LABORATORY WORK:  Initial lab work includes hemoglobin  16, hematocrit 48,  white count 8000; electrolytes normal; BUN 19, creatinine 1.0, blood sugar  133.  CK-MB and troponin-I were negative.   His electrocardiogram shows no ischemic changes.   HOSPITAL COURSE:  The patient was admitted to Telemetry.  He was observed on  Telemetry for 48 hours.  The patient had no arrhythmias on telemetry  observation.  The patient ambulated in the hall without difficulty prior to  discharge.  We checked orthostatic blood pressure checks which did not show  any significant orthostatic drop.  A CT scan of the head was negative.  An  echocardiogram showed asymmetric septal hypertrophy and mild LVH, but no  evidence of dynamic outflow obstruction and he does have diastolic  dysfunction with impaired relaxation.  Carotid Dopplers showed moderate  focal calcific plaque at the origin of the bifurcation of the right internal  carotid artery with 60% to 80% internal carotid artery stenosis at the lower  end of the scale and the left side was clear.   Vertebral artery flow was  antegrade bilaterally.  The patient complained of some pain at the base of  the right thumb and we got an x-ray that which did show a mild ventral  subluxation of the first proximal phalanx relative to the first metacarpal,  but there was no evidence of fracture.   An electroencephalogram was done 1 day prior to discharge and the results  are pending at the time of dictation.   By October 02, 2005, the patient was felt stable enough to be discharged  home.  We will have him come into the office this afternoon for placement of  an event monitor to look further for transient arrhythmia as a cause of his  syncope.   DISCHARGE MEDICATIONS:  1.  Proscar 5 mg one daily.  2.  Baby aspirin 81 mg daily.  3.  Plavix 75 mg daily.  4.  Lipitor 10 mg daily.  5.  Prilosec 20 mg daily.  6.  Folic acid 1 mg daily.  7.  Toprol-XL 25 mg daily.  8.  Valium 5 mg daily p.r.n. for muscle spasm.   WOUND CARE:  He will have the sutures removed from his chin in about a week  at [facility] Emergency [facility].   FOLLOWUP:  He will see Jared Clark in about 1 week.  He will return to the  office this afternoon for placement of an event monitor.   DIET:  He will be on a low-cholesterol diet.   ACTIVITY:  He may start driving in 5 days.  He will gradually increase his  regular activity.   CONDITION ON DISCHARGE:  Improved.           ______________________________  Jared Clark, [name]     TB/MEDQ  D:  10/02/2005  T:  10/02/2005  Job:  [id number]

## 2011-04-25 NOTE — Op Note (Signed)
Jared Clark. [facility]  Patient:    Jared Clark, Jared Clark                     MRN: [id number] Proc. Date: 02/10/01 Adm. Date:  [id number] Attending:  Andre Lefort CC:         Thomas A. Patty Sermons, [name]   Operative Report  DATE OF BIRTH:  June 23, 1936  CCS# [id number]  PREOPERATIVE DIAGNOSIS:  Chronic cholecystitis and cholelithiasis, umbilical hernia.  POSTOPERATIVE DIAGNOSIS:  Chronic cholecystitis, cholelithiasis, umbilical hernia, very small early right inguinal hernia.  OPERATION PERFORMED:  Laparoscopic cholecystectomy with intraoperative cholangiogram and repair of umbilical hernia.  SURGEON:  Sandria Bales. Ezzard Standing, [name]  ASSISTANT:  Zigmund Daniel, [name]  ANESTHESIA:  General endotracheal.  ESTIMATED BLOOD LOSS:  Minimal.  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  He comes for attempted laparoscopic cholecystectomy and umbilical hernia repair.  DESCRIPTION OF PROCEDURE:  The patient was placed in a supine position and given a general endotracheal anesthetic.  He had an orogastric tube in place and was given 1 gm of Ancef at the initiation of the procedure.  He had PAS stockings in place and the abdomen was shaved, prepped with Betadine solution and sterilely draped.  An infraumbilical incision was made with sharp dissection carried down to the abdominal cavity.  A 0 degree laparoscope was inserted through a 12 mm Hasson trocar.  The Hasson trocar was secured with a 0 Vicryl suture.  Abdominal exploration revealed the right and left lobe of the liver were unremarkable. Anterior wall of the stomach was unremarkable.  The patient thought that he felt a mass in his right abdominal wall, but at least laparoscopically, there is no evidence of intra-abdominal mass.  There is no evidence of abdominal wall defect on the  right side or the upper abdomen.  Going to the pelvis, the patient does have a very early small right inguinal hernia.  Again, this is fairly tiny.  I see nothing on the left side.  He has no other mass or lesions.  Three additional trocars were placed.  A 10 mm Ethicon trocar in the subxiphoid location, a 5 mm Ethicon trocar in the right midsubcostal location and a 5 mm trocar on the right lateral subcostal location.  The gallbladder was then grasped.  There were noted to be adhesions more to the liver than the gallbladder up in the right upper quadrant.  These were all taken down with hook Bovie coagulation.  The gallbladder was then grasped and rotated cephalad.  The cystic duct was then identified.  The patient had a very tiny cystic duct which was no more than about 3 or 4 mm.  I did shoot an intraoperative cholangiogram.  The intraoperative cholangiogram was obtained using a cutoff taut catheter which was inserted through a 14 gauge Jelco into the side of the cystic duct and secured with an endoclip.  Intraoperative cholangiogram showed a small cystic duct which was long, crossed into a small common bile duct and emptied without evidence of obstruction, mass or dilatation.  This was felt to be a normal intraoperative cholangiogram.  The cystic duct was then triply endoclipped and divided.  The cystic artery was then identified, triply endoclipped and divided.  The gallbladder was then bluntly and sharply dissected from the gallbladder bed using primarily hook Bovie coagulation. There  was one additional bleeder which required a clip.  The remainder of the gallbladder actually removed without a lot of difficulty.  Prior to complete division of the gallbladder from the gallbladder bed, I revisualized the gallbladder in the triangle of Calot.  There was no bleeding and no bile leak. The abdomen was then irrigated with about 1.5L of saline.  The gallbladder was then placed in an  Endocatch bag and delivered through the umbilicus.  I then expanded his umbilical incision to make a kind of smiley face incision and cut down to identify his  umbilical hernia.  He had about a 1.5 cm defect in his fascia.  The skin was freed from the defect and freshened up.  I then closed the umbilical hernia using four interrupted #1 Novofil sutures in interrupted fashion.  I then tacked the skin back down, placed 3-0 Vicryl subcuticular sutures and closed the skin at each site with a 5-0 Vicryl suture, painted with tincture of benzoin, Steri-Strips and sterilely dressed.  The patient tolerated the procedure well.  The wounds were sterilely dressed. He was transferred to the recovery room in good condition.  The sponge and needle counts were correct at the end of the case.  The gallbladder was sent off to pathology. DD:  02/10/01 TD:  02/10/01 Job: [id number]

## 2011-04-25 NOTE — H&P (Signed)
NAMENICOLES, SEDLACEK              ACCOUNT NO.:  [id number]   MEDICAL RECORD NO.:  [id number]          PATIENT TYPE:  INP   LOCATION:  2004                         FACILITY:  [facility]   PHYSICIAN:  Colleen Can. Deborah Chalk, M.D.DATE OF BIRTH:  1935/12/31   DATE OF ADMISSION:  09/30/2005  DATE OF DISCHARGE:                                HISTORY & PHYSICAL   CHIEF COMPLAINT:  Passed out.   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  He has basically been doing well, but has in the past  noted occasional elevations of his heart rate.  He presents to the emergency  department today after an episode of syncope.  He notes that he was standing  in the kitchen at approximately 10 a.m. this morning when he felt bad.  He  felt like he needed to have a bowel movement, so he went to the bathroom.  There, he noted his heart rate to be about 100.  He did feel nauseated and  very dizzy.  He was able to stand up and then go into the family room, where  he tried to lie down on the sofa; however, instead he passed out.  He does  remember hearing a loud noise.  He had no loss of bowel or bladder function.  He had no tongue biting.  This was not witnessed.  His wife was in the house  but did not hear or see the incident.  The duration was felt to have been  possibly just a few seconds to 1 minute.  He notes that when he came to his  pulse was quite thready.  He was sweaty and clammy.  His wife noted that he  was pale when she found him.  She called the office and was referred to the  emergency room for further evaluation.   PAST MEDICAL HISTORY:  1.  Known ischemic heart disease.  He has had previous catheterization with      angioplasty and stent placement in September of 2003.  At that time, he      was noted to have a normal left main, mild to moderate proximal      irregularities of  the LAD, 30% to 40% stenosis in the proximal diagonal,      95% marginal stenosis, and a 30% to 40% distal stenosis in the right      coronary.  He subsequently underwent stenting with a 2.5 x 13 mm Cypher      drug-eluting stent.  He had a Cardiolite study in March of 2006 which      showed no evidence of ischemia with an ejection fraction of 57%.  2.  Hypertension.  3.  Hyperlipidemia.  4.  Obesity.  5.  BPH.  6.  History of PAC's.  7.  Hiatal hernia.  8.  Osteoarthritis.   SURGICAL HISTORY:  1.  Tonsillectomy.  2.  Lumbar laminectomy.  3.  Pilonidal cyst.  4.  Hernia repair.  5.  Cholecystectomy.  6.  Bilateral shoulder arthroscopies.   ALLERGIES:  1.  NORFLEX.  2.  ROBAXIN.   CURRENT MEDICATIONS:  1.  Proscar 5 mg a day.  2.  Baby aspirin daily.  3.  Plavix 75 mg a day.  4.  Lipitor 10 mg a day.  5.  Prilosec 20 mg a day.  6.  Folic acid daily.  7.  Toprol-XL 25 mg daily.  8.  Valium on a p.r.n. basis.  9.  Hydrochlorothiazide on a p.r.n. basis.   FAMILY HISTORY:  His mother had a light heart attack in her early 64s.  His  family history is positive for hypertension and diabetes.   SOCIAL HISTORY:  He is married.  He has no smoking.  He does have social  alcohol use.  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   REVIEW OF SYSTEMS:  Basically as noted above, and is otherwise unremarkable.   PHYSICAL EXAMINATION:  GENERAL:  He is currently in no acute distress.  VITAL SIGNS:  Blood pressure is 123/70, heart rate is in the 80s,  respirations 18.  He is afebrile.  SKIN:  Warm and dry.  Color is unremarkable.  He has multiple abrasions of  the right leg, as well as one under the left lip which required suturing.  LUNGS:  Clear.  HEART:  Regular rhythm.  ABDOMEN:  Soft, nontender, with positive bowel sounds.  EXTREMITIES:  Without edema.  NEUROLOGIC:  No gross focal deficits.   PERTINENT LABORATORIES:   His CK and troponin markers were negative x1.  EKG  showed no acute changes.  CBC was normal.  Chemistry were normal, except for  a glucose of 133.   OVERALL IMPRESSION:  1.  Syncope with resultant lacerations/drop attack.  This spell was      progressive in nature.  2.  Known ischemic heart disease.  He has had previous stent placement in      2003.  3.  Hypertension.  4.  History of PAC's.  5.  Hyperlipidemia.   PLAN:  He will be admitted to telemetry at [facility].  Labs will  be checked.  He will need to have CT scanning of the head, 2-D  echocardiogram, and carotid Dopplers.  His home medicines will be continued.      Sharlee Blew, N.P.      Colleen Can. Deborah Chalk, [name]  Electronically Signed    LC/MEDQ  D:  10/01/2005  T:  10/01/2005  Job:  [id number]   cc:   [name], [name]  Fax: [phone]

## 2011-08-22 ENCOUNTER — Encounter: Payer: Self-pay | Admitting: Cardiology

## 2011-08-22 ENCOUNTER — Ambulatory Visit (INDEPENDENT_AMBULATORY_CARE_PROVIDER_SITE_OTHER): Payer: Medicare Other | Admitting: Cardiology

## 2011-08-22 VITALS — BP 152/86 | HR 64 | Ht 76.5 in | Wt 261.0 lb

## 2011-08-22 DIAGNOSIS — I2510 Atherosclerotic heart disease of native coronary artery without angina pectoris: Secondary | ICD-10-CM

## 2011-08-22 DIAGNOSIS — I119 Hypertensive heart disease without heart failure: Secondary | ICD-10-CM

## 2011-08-22 DIAGNOSIS — R51 Headache: Secondary | ICD-10-CM

## 2011-08-22 DIAGNOSIS — R519 Headache, unspecified: Secondary | ICD-10-CM

## 2011-08-22 DIAGNOSIS — I259 Chronic ischemic heart disease, unspecified: Secondary | ICD-10-CM

## 2011-08-22 DIAGNOSIS — E785 Hyperlipidemia, unspecified: Secondary | ICD-10-CM

## 2011-08-22 HISTORY — DX: Hypertensive heart disease without heart failure: I11.9

## 2011-08-22 NOTE — Assessment & Plan Note (Signed)
The patient has not had any recurrent chest pain to suggest angina pectoris he does have known ischemic heart disease and had stenting of his first obtuse marginal vessel in September 2003.  He had a repeat cardiac catheterization in March 2010 after an abnormal stress Cardiolite but the catheter showed long-term patency of the prior stent.  His last nuclear stress test was 11/13/10 and showed fair exercise capacity, hypertensive blood pressure response and no evidence of ischemia and his ejection fraction was 58%.

## 2011-08-22 NOTE — Progress Notes (Signed)
Jared Clark Date of Birth:  October 15, 1936 [facility] / [facility] [address]   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  He has been concerned about his blood pressure for the past several days.  He was told at his orthopedist office it was running high.  At about the same time he began noticing some bifrontal dull headache.  He came in today to be checked about his blood pressure and to be sure that we didn't need to adjust his medication.  Since last visit he's not been as careful with his diet and his weight is up 5 pounds  Current Outpatient Prescriptions  Medication Sig Dispense Refill  . aspirin 81 MG tablet Take 81 mg by mouth daily.        [name] atorvastatin (LIPITOR) 10 MG tablet Take 10 mg by mouth daily.        . clopidogrel (PLAVIX) 75 MG tablet Take 75 mg by mouth daily. takes 3-4 times/week       . diazepam (VALIUM) 5 MG tablet Take 5 mg by mouth every 12 (twelve) hours as needed.        . finasteride (PROSCAR) 5 MG tablet Take 5 mg by mouth every other day.       . metoprolol succinate (TOPROL-XL) 25 MG 24 hr tablet Take 25 mg by mouth daily. Taking toprol tartrate 25mg .  One half daily      . omeprazole (PRILOSEC) 20 MG capsule Take 20 mg by mouth as needed.       . ramipril (ALTACE) 2.5 MG capsule Take 2.5 mg by mouth daily.        [name] zolpidem (AMBIEN) 5 MG tablet Take 5 mg by mouth at bedtime as needed.          Allergies  Allergen Reactions  . Methocarbamol     REACTION: Reaction not known  . Orphenadrine Citrate     REACTION: Reaction not known    Patient Active Problem List  Diagnoses  . Ischemic heart disease  . Dyslipidemia  . BPH (benign prostatic hyperplasia)  . Malaise and fatigue    History  Smoking status  . Never Smoker   Smokeless tobacco  . Never Used    History  Alcohol Use  . Yes    No family  history on file.  Review of Systems: The patient denies any heat or cold intolerance.  No weight gain or weight loss.  The patient denies headaches or blurry vision.  There is no cough or sputum production.  The patient denies dizziness.  There is no hematuria or hematochezia.  The patient denies any muscle aches or arthritis.  The patient denies any rash.  The patient denies frequent falling or instability.  There is no history of depression or anxiety.  All other systems were reviewed and are negative.   Physical Exam: Filed Vitals:   08/22/11 1011  BP: 152/86  Pulse: 64   The general appearance reveals a large gentleman in no acute distress.Pupils equal and reactive.   Extraocular Movements are full.  There is no scleral icterus.  The mouth and pharynx are normal.  The neck is supple.  The carotids reveal no bruits.  The jugular venous pressure is normal.  The thyroid is not enlarged.  There is no  lymphadenopathy.  The chest is clear to percussion and auscultation. There are no rales or rhonchi. Expansion of the chest is symmetrical.  The precordium is quiet.  The first heart sound is normal.  The second heart sound is physiologically split.  There is no murmur gallop rub or click.  There is no abnormal lift or heave.  The abdomen is soft and nontender. Bowel sounds are normal. The liver and spleen are not enlarged. There Are no abdominal masses. There are no bruits.  The pedal pulses are good.  There is no phlebitis or edema.  There is no cyanosis or clubbing.  Strength is normal and symmetrical in all extremities.  There is no lateralizing weakness.  There are no sensory deficits.     Assessment / Plan: Recheck at regular visit and he'll get fasting lab work ahead of time

## 2011-08-22 NOTE — Assessment & Plan Note (Signed)
The patient's blood pressure today is running high and this could be the cause of his headaches.  We will increase his metoprolol tartrate 2 a formal 25 mg tablet daily.  Also avoid excessive salt.  We will recheck him at his regular appointment in October.  He needs to work harder on weight loss to help blood pressure also

## 2011-08-22 NOTE — Assessment & Plan Note (Addendum)
Patient has a history of dyslipidemia.He is on Lipitor 10 mg daily.  He is not having any side effects from the Lipitor.  He is due to have his blood work done in early October prior to his regularly scheduled office visit and

## 2011-09-01 ENCOUNTER — Ambulatory Visit: Payer: PRIVATE HEALTH INSURANCE | Admitting: Cardiology

## 2011-09-15 ENCOUNTER — Ambulatory Visit (INDEPENDENT_AMBULATORY_CARE_PROVIDER_SITE_OTHER): Payer: Medicare Other | Admitting: *Deleted

## 2011-09-15 DIAGNOSIS — I119 Hypertensive heart disease without heart failure: Secondary | ICD-10-CM

## 2011-09-15 DIAGNOSIS — E785 Hyperlipidemia, unspecified: Secondary | ICD-10-CM

## 2011-09-15 LAB — BASIC METABOLIC PANEL
BUN: 18 mg/dL (ref 6–23)
CO2: 24 mEq/L (ref 19–32)
Calcium: 8.9 mg/dL (ref 8.4–10.5)
Chloride: 107 mEq/L (ref 96–112)
Creatinine, Ser: 0.9 mg/dL (ref 0.4–1.5)
Glucose, Bld: 110 mg/dL — ABNORMAL HIGH (ref 70–99)
Sodium: 139 mEq/L (ref 135–145)

## 2011-09-15 LAB — LIPID PANEL
Cholesterol: 137 mg/dL (ref 0–200)
HDL: 47.3 mg/dL (ref >39.00)
LDL Cholesterol: 74 mg/dL (ref 0–99)
Total CHOL/HDL Ratio: 3
Triglycerides: 77 mg/dL (ref 0.0–149.0)
VLDL: 15.4 mg/dL (ref 0.0–40.0)

## 2011-09-17 ENCOUNTER — Ambulatory Visit (INDEPENDENT_AMBULATORY_CARE_PROVIDER_SITE_OTHER): Payer: Medicare Other | Admitting: Cardiology

## 2011-09-17 ENCOUNTER — Encounter: Payer: Self-pay | Admitting: Cardiology

## 2011-09-17 VITALS — BP 147/79 | HR 59 | Ht 77.0 in | Wt 260.0 lb

## 2011-09-17 DIAGNOSIS — I119 Hypertensive heart disease without heart failure: Secondary | ICD-10-CM

## 2011-09-17 DIAGNOSIS — E7800 Pure hypercholesterolemia, unspecified: Secondary | ICD-10-CM

## 2011-09-17 DIAGNOSIS — E785 Hyperlipidemia, unspecified: Secondary | ICD-10-CM

## 2011-09-17 DIAGNOSIS — I259 Chronic ischemic heart disease, unspecified: Secondary | ICD-10-CM

## 2011-09-17 NOTE — Progress Notes (Signed)
Jared Clark Date of Birth:  1936/06/29 [facility] / [facility] [address]   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  He has a history of known ischemic heart disease.  In September of 2003.  He underwent stenting of his first obtuse marginal vessel.  He had a repeat cardiac catheterization in March 2010 after an abnormal stress Cardiolite.  The cardiac catheterization showed long term patency of the prior stent and nonobstructive atherosclerotic disease elsewhere and he had normal left ventricular function.  His last nuclear stress test was 11/13/10 and showed no ischemia fair exercise capacity hypertensive blood pressure response and his ejection fraction was 58%.  Current Outpatient Prescriptions  Medication Sig Dispense Refill  . aspirin 81 MG tablet Take 81 mg by mouth daily.        [name] atorvastatin (LIPITOR) 10 MG tablet Take 10 mg by mouth daily.        . clopidogrel (PLAVIX) 75 MG tablet Take 75 mg by mouth daily. takes 3-4 times/week       . diazepam (VALIUM) 5 MG tablet Take 5 mg by mouth every 12 (twelve) hours as needed.        . finasteride (PROSCAR) 5 MG tablet Take 5 mg by mouth daily.       . metoprolol succinate (TOPROL-XL) 25 MG 24 hr tablet Take 25 mg by mouth daily. Taking toprol tartrate 25mg .  One half daily      . ramipril (ALTACE) 2.5 MG capsule Take 2.5 mg by mouth daily.        . valACYclovir (VALTREX) 1000 MG tablet Take 1,000 mg by mouth as needed.        . zolpidem (AMBIEN) 5 MG tablet Take 5 mg by mouth at bedtime as needed.          Allergies  Allergen Reactions  . Methocarbamol     REACTION: Reaction not known  . Orphenadrine Citrate     REACTION: Reaction not known    Patient Active Problem List  Diagnoses  . Ischemic heart disease  . Dyslipidemia  . BPH (benign prostatic hyperplasia)  .  Malaise and fatigue  . Benign hypertensive heart disease without heart failure    History  Smoking status  . Never Smoker   Smokeless tobacco  . Never Used    History  Alcohol Use  . Yes    Family History  Problem Relation Age of Onset  . Cancer Father     Lung Cancer    Review of Systems: The patient denies any heat or cold intolerance.  No weight gain or weight loss.  The patient denies headaches or blurry vision.  There is no cough or sputum production.  The patient denies dizziness.  There is no hematuria or hematochezia.  The patient denies any muscle aches or arthritis.  The patient denies any rash.  The patient denies frequent falling or instability.  There is no history of depression or anxiety.  All other systems were reviewed and are negative.   Physical Exam: Filed Vitals:   09/17/11 1437  BP: 147/79  Pulse: 59   General appearance reveals a well-developed, well-nourished, large gentleman, in no distress.  Pupils equal and reactive.   Extraocular Movements are full.  There is no scleral icterus.  The mouth and pharynx  are normal.  The neck is supple.  The carotids reveal no bruits.  The jugular venous pressure is normal.  The thyroid is not enlarged.  There is no lymphadenopathy.  The chest is clear to percussion and auscultation. There are no rales or rhonchi. Expansion of the chest is symmetrical.  The precordium is quiet.  The first heart sound is normal.  The second heart sound is physiologically split.  There is no murmur gallop rub or click.  There is no abnormal lift or heave.  The abdomen is soft and nontender. Bowel sounds are normal. The liver and spleen are not enlarged. There Are no abdominal masses. There are no bruits.  The pedal pulses are good.  There is no phlebitis or edema.  There is no cyanosis or clubbing. The skin is warm and dry.  There is no rash. I rechecked his blood pressure at the end of the visit and it was down to  136/80   Assessment / Plan:  Continue same medication.  Will work harder on diet and weight loss.  Recheck in 6 months for followup office visit and fasting lab work.  He is also working on getting a primary care provider for his noncardiac issues

## 2011-09-17 NOTE — Assessment & Plan Note (Signed)
The patient has a history of high blood pressure.  This is related in part to his exogenous obesity.  On his last office visit we increased his beta blocker to a fall 25 mg metoprolol succinate each day.  He checks his blood pressure at home and it has been satisfactory

## 2011-09-17 NOTE — Assessment & Plan Note (Signed)
The patient has a history of dyslipidemia.  We reviewed his recent lab work, which is satisfactory.  The patient is to continue same medication.  He is not having any side effects from his Lipitor.

## 2011-09-17 NOTE — Assessment & Plan Note (Signed)
The patient has not been experiencing any exertional chest pain or angina pectoris recurrence

## 2011-09-17 NOTE — Patient Instructions (Signed)
continue same dose of medications  Your physician wants you to follow-up in: 6 months You will receive a reminder letter in the mail two months in advance. If you don't receive a letter, please call our office to schedule the follow-up appointment.  

## 2011-10-07 ENCOUNTER — Telehealth: Payer: Self-pay | Admitting: Cardiology

## 2011-10-07 NOTE — Telephone Encounter (Signed)
Pt Signed ROI,Pt Picked Up Records Today  10/07/11/km

## 2011-10-10 ENCOUNTER — Telehealth: Payer: Self-pay | Admitting: Cardiology

## 2011-10-10 NOTE — Telephone Encounter (Signed)
Faxed over as requested.

## 2011-10-10 NOTE — Telephone Encounter (Signed)
His last stress test was in December 2011 and was normal.  He is okay for orthopedic surgery.

## 2011-10-10 NOTE — Telephone Encounter (Signed)
Ok? Chart on your cart

## 2011-10-10 NOTE — Telephone Encounter (Signed)
Pt to have knee orthroscopy, needs surgical clearence sent to [place] orth[facility] dr Charlyne Quale fax [phone]

## 2011-10-20 ENCOUNTER — Ambulatory Visit (INDEPENDENT_AMBULATORY_CARE_PROVIDER_SITE_OTHER): Payer: Medicare Other | Admitting: *Deleted

## 2011-10-20 DIAGNOSIS — I6529 Occlusion and stenosis of unspecified carotid artery: Secondary | ICD-10-CM

## 2011-10-29 NOTE — Procedures (Unsigned)
CAROTID DUPLEX EXAM  INDICATION:  Carotid disease  HISTORY: Diabetes:  No Cardiac:  Yes Hypertension:  Yes Smoking:  No Previous Surgery:  No CV History:  Asymptomatic Amaurosis Fugax No, Paresthesias No, Hemiparesis No                                      RIGHT             LEFT Brachial systolic pressure:         133               138 Brachial Doppler waveforms:         Normal            Normal Vertebral direction of flow:        Antegrade         Antegrade DUPLEX VELOCITIES (cm/sec) CCA peak systolic                   83                70 ECA peak systolic                   64                79 ICA peak systolic                   211               57 ICA end diastolic                   67                19 PLAQUE MORPHOLOGY:                  Calcific          Heterogeneous PLAQUE AMOUNT:                      Moderate          Minimal PLAQUE LOCATION:                    ICA, bifurcation  Bifurcation  IMPRESSION: 1. Right internal carotid artery velocity suggests 60%-79% stenosis     (low end of range). 2. Left internal carotid artery velocity suggests 1%-39% stenosis. 3. Antegrade vertebral arteries bilaterally.  ADDITIONAL REFERRING PHYSICIAN:  Dr. Timothy Lasso  ___________________________________________ Di Kindle. Edilia Bo, [name]  EM/MEDQ  D:  10/20/2011  T:  10/20/2011  Job:  [id number]

## 2011-11-03 ENCOUNTER — Encounter (HOSPITAL_BASED_OUTPATIENT_CLINIC_OR_DEPARTMENT_OTHER): Payer: Self-pay | Admitting: *Deleted

## 2011-11-03 NOTE — Progress Notes (Addendum)
NPO AFTER MN. PT TO ARRIVE AT 0900. NEEDS ISTAT. LAST OFFICE NOTE, EKG, STRESS TEST, AND ECHO TO BE FAXED FROM DR BRACKBILL. WILL TAKE PRILOSEC, LIPITOR, AND TOPREL AM OF SURG. W/ SIP OF WATER.  EKG NOT CURRENT FROM OFFICE , WILL NEED ON ARRIVAL. LAST NOTE IN EPIC 09-17-11 AND REQUESTED STRESS TEST

## 2011-11-04 ENCOUNTER — Encounter (HOSPITAL_BASED_OUTPATIENT_CLINIC_OR_DEPARTMENT_OTHER): Payer: Self-pay | Admitting: *Deleted

## 2011-11-04 NOTE — H&P (Signed)
CC- Jared Clark is a 75 y.o. male who presents with right knee pain.  HPI- . Knee Pain: Patient presents with a knee injury involving the  right knee. Onset of the symptoms was several months ago. Inciting event: twisting injury while walking. Current symptoms include giving out, pain located medially, stiffness and swelling. Pain is aggravated by going up and down stairs, kneeling, squatting and walking.  Patient has had no prior knee problems. Evaluation to date: MRI: abnormal medial meniscal tear. Treatment to date: avoidance of offending activity, corticosteroid injection which was somewhat effective, ice and OTC analgesics which are not very effective.  Past Medical History  Diagnosis Date  . Sinus bradycardia   . Dyslipidemia     History of dyslipidemia  . Mild depression   . BPH (benign prostatic hypertrophy)     S/P TURP 1995  . Hypertension   . Reflux OCCASIONAL    TAKES PRILOSEC  . Osteoarthritis     RIGHT ANKLE, BILATERAL WRIST, BACK  . Borderline diabetes     DIET CONTROLLED  . Cataract of right eye   . Coronary artery disease CARDIOLOGIST- DR BRACKBILL    LAST VISIT 6 WKS AGO--  LAST VISIT 09-17-11 NOTE IN EPIC , REQUESTED  STRESS TEST(11-13-10)    Past Surgical History  Procedure Date  . Lumbar laminectomy 1973    L4-S1  . Shoulder arthroscopy     Bilateral  . Coronary angioplasty 2003- FIRST OBTUSE MARGIANL VESSEL     X1 CYPHER STENT  . Cardiac catheterization 02-06-09    NO INTERVENTION  . Tonsillectomy and adenoidectomy CHILD  . Cholecystectomy 2002    W/ UMBILICAL HERNIA REPAIR  . Hernia repair 2002    Umbilical  . Inguinal hernia repair 1985  . Transurethral resection of prostate 1995  . Cervical discectomy 1989    C4 - 5  . Pilonidal cyst excision 1961    Prior to Admission medications   Medication Sig Start Date End Date Taking? Authorizing Provider  naproxen (NAPROSYN) 250 MG tablet Take 250 mg by mouth 2 (two) times daily with a meal.      Yes Historical Provider, MD  aspirin 81 MG tablet Take 81 mg by mouth daily.     Historical Provider, MD  atorvastatin (LIPITOR) 10 MG tablet Take 10 mg by mouth every morning.     Historical Provider, MD  clopidogrel (PLAVIX) 75 MG tablet Take 75 mg by mouth daily. takes 3-4 times/week    Historical Provider, MD  diazepam (VALIUM) 5 MG tablet Take 5 mg by mouth every 12 (twelve) hours as needed.     Historical Provider, MD  finasteride (PROSCAR) 5 MG tablet Take 5 mg by mouth daily.     Historical Provider, MD  metoprolol succinate (TOPROL-XL) 25 MG 24 hr tablet Take 25 mg by mouth every morning.     Historical Provider, MD  ramipril (ALTACE) 2.5 MG capsule Take 2.5 mg by mouth daily.     Historical Provider, MD  valACYclovir (VALTREX) 1000 MG tablet Take 1,000 mg by mouth as needed.     Historical Provider, MD  zolpidem (AMBIEN) 5 MG tablet Take 5 mg by mouth at bedtime as needed.     Historical Provider, MD   KNEE EXAM- RIGHT antalgic gait, soft tissue tenderness over medial joint line, reduced range of motion, positive McMurray sign, negative Lachman sign, normal ipsilateral hip exam  Physical Examination: General appearance - alert, well appearing, and in no distress Mental status -  alert, oriented to person, place, and time Chest - clear to auscultation, no wheezes, rales or rhonchi, symmetric air entry Cardiac- RRR; no murmurs Abd- Soft non tender   Asessment/Plan--- Right knee medial meniscal tear- - Plan right knee arthroscopy with meniscal debridement. Procedure risks and potential comps discussed with patient who elects to proceed. Goals are decreased pain and increased function with a high likelihood of achieving both

## 2011-11-05 ENCOUNTER — Encounter (HOSPITAL_BASED_OUTPATIENT_CLINIC_OR_DEPARTMENT_OTHER): Payer: Self-pay | Admitting: Anesthesiology

## 2011-11-05 ENCOUNTER — Encounter (HOSPITAL_BASED_OUTPATIENT_CLINIC_OR_DEPARTMENT_OTHER)
Admission: RE | Disposition: A | Discharge status: Home / Self Care | Payer: Self-pay | Source: Ambulatory Visit | Attending: Orthopedic Surgery

## 2011-11-05 ENCOUNTER — Ambulatory Visit (HOSPITAL_BASED_OUTPATIENT_CLINIC_OR_DEPARTMENT_OTHER): Payer: Medicare Other | Admitting: Anesthesiology

## 2011-11-05 ENCOUNTER — Ambulatory Visit (HOSPITAL_BASED_OUTPATIENT_CLINIC_OR_DEPARTMENT_OTHER)
Admission: RE | Admit: 2011-11-05 | Discharge: 2011-11-05 | Disposition: A | Discharge status: Home / Self Care | Payer: Medicare Other | Source: Ambulatory Visit | Attending: Orthopedic Surgery | Admitting: Orthopedic Surgery

## 2011-11-05 ENCOUNTER — Encounter (HOSPITAL_BASED_OUTPATIENT_CLINIC_OR_DEPARTMENT_OTHER): Payer: Self-pay | Admitting: Orthopedic Surgery

## 2011-11-05 ENCOUNTER — Encounter (HOSPITAL_BASED_OUTPATIENT_CLINIC_OR_DEPARTMENT_OTHER): Payer: Self-pay | Admitting: *Deleted

## 2011-11-05 DIAGNOSIS — Y9301 Activity, walking, marching and hiking: Secondary | ICD-10-CM | POA: Insufficient documentation

## 2011-11-05 DIAGNOSIS — I2510 Atherosclerotic heart disease of native coronary artery without angina pectoris: Secondary | ICD-10-CM | POA: Insufficient documentation

## 2011-11-05 DIAGNOSIS — I10 Essential (primary) hypertension: Secondary | ICD-10-CM | POA: Insufficient documentation

## 2011-11-05 DIAGNOSIS — Z79899 Other long term (current) drug therapy: Secondary | ICD-10-CM | POA: Insufficient documentation

## 2011-11-05 DIAGNOSIS — M2390 Unspecified internal derangement of unspecified knee: Secondary | ICD-10-CM | POA: Insufficient documentation

## 2011-11-05 DIAGNOSIS — IMO0002 Reserved for concepts with insufficient information to code with codable children: Secondary | ICD-10-CM | POA: Insufficient documentation

## 2011-11-05 DIAGNOSIS — K219 Gastro-esophageal reflux disease without esophagitis: Secondary | ICD-10-CM | POA: Insufficient documentation

## 2011-11-05 DIAGNOSIS — E785 Hyperlipidemia, unspecified: Secondary | ICD-10-CM | POA: Insufficient documentation

## 2011-11-05 DIAGNOSIS — M159 Polyosteoarthritis, unspecified: Secondary | ICD-10-CM | POA: Insufficient documentation

## 2011-11-05 DIAGNOSIS — S83249A Other tear of medial meniscus, current injury, unspecified knee, initial encounter: Secondary | ICD-10-CM

## 2011-11-05 DIAGNOSIS — X500XXA Overexertion from strenuous movement or load, initial encounter: Secondary | ICD-10-CM | POA: Insufficient documentation

## 2011-11-05 HISTORY — DX: Prediabetes: R73.03

## 2011-11-05 HISTORY — DX: Essential (primary) hypertension: I10

## 2011-11-05 HISTORY — DX: Unspecified cataract: H26.9

## 2011-11-05 HISTORY — PX: KNEE ARTHROSCOPY: SHX127

## 2011-11-05 HISTORY — DX: Reserved for inherently not codable concepts without codable children: IMO0001

## 2011-11-05 HISTORY — DX: Gastro-esophageal reflux disease without esophagitis: K21.9

## 2011-11-05 LAB — POCT I-STAT 4, (NA,K, GLUC, HGB,HCT): HCT: 43 % (ref 39.0–52.0)

## 2011-11-05 SURGERY — ARTHROSCOPY, KNEE
Anesthesia: General | Site: Knee | Laterality: Right | Wound class: Clean

## 2011-11-05 MED ORDER — DEXAMETHASONE SODIUM PHOSPHATE 4 MG/ML IJ SOLN
INTRAMUSCULAR | Status: DC | PRN
  Administered 2011-11-05: 8 mg via INTRAVENOUS

## 2011-11-05 MED ORDER — BUPIVACAINE-EPINEPHRINE 0.25% -1:200000 IJ SOLN
INTRAMUSCULAR | Status: DC | PRN
  Administered 2011-11-05: 20 mL

## 2011-11-05 MED ORDER — PROPOFOL 10 MG/ML IV EMUL
INTRAVENOUS | Status: DC | PRN
  Administered 2011-11-05: 50 mg via INTRAVENOUS
  Administered 2011-11-05: 200 mg via INTRAVENOUS

## 2011-11-05 MED ORDER — CEFAZOLIN SODIUM-DEXTROSE 2-3 GM-% IV SOLR
2.0000 g | INTRAVENOUS | Status: AC
  Administered 2011-11-05: 2 g via INTRAVENOUS

## 2011-11-05 MED ORDER — LACTATED RINGERS IV SOLN
INTRAVENOUS | Status: DC
  Administered 2011-11-05 (×2): via INTRAVENOUS
  Administered 2011-11-05: 100 mL/h via INTRAVENOUS

## 2011-11-05 MED ORDER — OXYCODONE-ACETAMINOPHEN 5-325 MG PO TABS: 1.0000 | ORAL_TABLET | ORAL | Status: AC | PRN

## 2011-11-05 MED ORDER — SODIUM CHLORIDE 0.9 % IR SOLN
Status: DC | PRN
  Administered 2011-11-05: 3

## 2011-11-05 MED ORDER — FENTANYL CITRATE 0.05 MG/ML IJ SOLN
INTRAMUSCULAR | Status: DC | PRN
  Administered 2011-11-05: 25 ug via INTRAVENOUS
  Administered 2011-11-05: 50 ug via INTRAVENOUS
  Administered 2011-11-05 (×2): 25 ug via INTRAVENOUS
  Administered 2011-11-05: 50 ug via INTRAVENOUS
  Administered 2011-11-05: 25 ug via INTRAVENOUS

## 2011-11-05 MED ORDER — MIDAZOLAM HCL 5 MG/5ML IJ SOLN
INTRAMUSCULAR | Status: DC | PRN
  Administered 2011-11-05: 1 mg via INTRAVENOUS

## 2011-11-05 MED ORDER — ONDANSETRON HCL 4 MG/2ML IJ SOLN
INTRAMUSCULAR | Status: DC | PRN
  Administered 2011-11-05: 4 mg via INTRAVENOUS

## 2011-11-05 MED ORDER — CHLORHEXIDINE GLUCONATE 4 % EX LIQD: 60.0000 mL | Freq: Once | CUTANEOUS | Status: DC

## 2011-11-05 MED ORDER — LIDOCAINE HCL (CARDIAC) 20 MG/ML IV SOLN
INTRAVENOUS | Status: DC | PRN
  Administered 2011-11-05: 50 mg via INTRAVENOUS

## 2011-11-05 SURGICAL SUPPLY — 28 items
BANDAGE ELASTIC 6 VELCRO ST LF (GAUZE/BANDAGES/DRESSINGS) ×2 IMPLANT
BLADE 4.2CUDA (BLADE) ×2 IMPLANT
CANISTER SUCT LVC 12 LTR MEDI- (MISCELLANEOUS) ×2 IMPLANT
CLOTH BEACON ORANGE TIMEOUT ST (SAFETY) ×2 IMPLANT
DRAPE ARTHROSCOPY W/POUCH 114 (DRAPES) ×2 IMPLANT
DRSG EMULSION OIL 3X3 NADH (GAUZE/BANDAGES/DRESSINGS) ×2 IMPLANT
DURAPREP 26ML APPLICATOR (WOUND CARE) ×2 IMPLANT
GLOVE BIO SURGEON STRL SZ8 (GLOVE) ×2 IMPLANT
GLOVE BIOGEL PI IND STRL 6.5 (GLOVE) ×1 IMPLANT
GLOVE BIOGEL PI INDICATOR 6.5 (GLOVE) ×1
GLOVE ECLIPSE 6.5 STRL STRAW (GLOVE) ×2 IMPLANT
GLOVE INDICATOR 8.0 STRL GRN (GLOVE) ×2 IMPLANT
GOWN PREVENTION PLUS LG XLONG (DISPOSABLE) ×4 IMPLANT
IV NS IRRIG 3000ML ARTHROMATIC (IV SOLUTION) ×6 IMPLANT
KNEE WRAP E Z 3 GEL PACK (MISCELLANEOUS) ×2 IMPLANT
PACK ARTHROSCOPY DSU (CUSTOM PROCEDURE TRAY) ×2 IMPLANT
PACK BASIN DAY SURGERY FS (CUSTOM PROCEDURE TRAY) ×2 IMPLANT
PADDING CAST ABS 4INX4YD NS (CAST SUPPLIES) ×1
PADDING CAST ABS COTTON 4X4 ST (CAST SUPPLIES) ×1 IMPLANT
PADDING CAST COTTON 6X4 STRL (CAST SUPPLIES) ×2 IMPLANT
PADDING WEBRIL 6 STERILE (GAUZE/BANDAGES/DRESSINGS) ×2 IMPLANT
SET ARTHROSCOPY TUBING (MISCELLANEOUS) ×1
SET ARTHROSCOPY TUBING LN (MISCELLANEOUS) ×1 IMPLANT
SPONGE GAUZE 4X4 12PLY (GAUZE/BANDAGES/DRESSINGS) ×2 IMPLANT
SUT ETHILON 4 0 PS 2 18 (SUTURE) ×2 IMPLANT
TOWEL OR 17X24 6PK STRL BLUE (TOWEL DISPOSABLE) ×2 IMPLANT
WAND 90 DEG TURBOVAC W/CORD (SURGICAL WAND) ×2 IMPLANT
WATER STERILE IRR 500ML POUR (IV SOLUTION) ×2 IMPLANT

## 2011-11-05 NOTE — Anesthesia Preprocedure Evaluation (Addendum)
Anesthesia Evaluation  Patient identified by MRN, date of birth, ID band Patient awake    Reviewed: Allergy & Precautions, H&P , NPO status , Patient's Chart, lab work & pertinent test results  Airway Mallampati: II TM Distance: >3 FB Neck ROM: Full    Dental No notable dental hx.    Pulmonary neg pulmonary ROS,  clear to auscultation  Pulmonary exam normal       Cardiovascular hypertension, + CAD and neg cardio ROS Regular Normal    Neuro/Psych PSYCHIATRIC DISORDERS Negative Neurological ROS  Negative Psych ROS   GI/Hepatic negative GI ROS, Neg liver ROS, GERD-  ,  Endo/Other  Negative Endocrine ROS  Renal/GU negative Renal ROS  Genitourinary negative   Musculoskeletal negative musculoskeletal ROS (+)   Abdominal   Peds negative pediatric ROS (+)  Hematology negative hematology ROS (+)   Anesthesia Other Findings Some left jaw pain, but opens well  Reproductive/Obstetrics negative OB ROS                           Anesthesia Physical Anesthesia Plan  ASA: III  Anesthesia Plan: General   Post-op Pain Management:    Induction: Intravenous  Airway Management Planned: LMA  Additional Equipment:   Intra-op Plan:   Post-operative Plan: Extubation in OR  Informed Consent: I have reviewed the patients History and Physical, chart, labs and discussed the procedure including the risks, benefits and alternatives for the proposed anesthesia with the patient or authorized representative who has indicated his/her understanding and acceptance.   Dental advisory given  Plan Discussed with: CRNA  Anesthesia Plan Comments: (Discussed gen vs. Mac/block. Plan general. No recent chest pain. Negative nuclear stress test 12/11)       Anesthesia Quick Evaluation

## 2011-11-05 NOTE — Transfer of Care (Signed)
Immediate Anesthesia Transfer of Care Note  Patient: Jared Clark  Procedure(s) Performed:  ARTHROSCOPY KNEE - RIGHT ARTHROSCOPY KNEE WITH DEBRIDEMENT, Chondroplasty  Patient Location: Patient transported to PACU with oxygen via face mask at 6 Liters / Min  Anesthesia Type: General  Level of Consciousness: awake and alert   Airway & Oxygen Therapy: Patient Spontanous Breathing and Patient connected to face mask oxygen Post-op Assessment: Report given to PACU RN and Post -op Vital signs reviewed and stable  Post vital signs: Reviewed and stable  Complications: No apparent anesthesia complications

## 2011-11-05 NOTE — Interval H&P Note (Signed)
History and Physical Interval Note:  11/05/2011 11:19 AM  Jared Clark  has presented today for surgery, with the diagnosis of RIGHT KNEE MEDIAL MENISCAL TEAR  The various methods of treatment have been discussed with the patient and family. After consideration of risks, benefits and other options for treatment, the patient has consented to  Procedure(s): ARTHROSCOPY KNEE as a surgical intervention .  The patients' history has been reviewed, patient examined, no change in status, stable for surgery.  I have reviewed the patients' chart and labs.  Questions were answered to the patient's satisfaction.     Loanne Drilling

## 2011-11-05 NOTE — Anesthesia Postprocedure Evaluation (Deleted)
  Anesthesia Post-op Note  Patient: Jared Clark  Procedure(s) Performed:  ARTHROSCOPY KNEE - RIGHT ARTHROSCOPY KNEE WITH DEBRIDEMENT, Chondroplasty  Patient Location: PACU  Anesthesia Type: General  Level of Consciousness: awake and alert   Airway and Oxygen Therapy: Patient Spontanous Breathing  Post-op Pain: mild  Post-op Assessment: Post-op Vital signs reviewed, Patient's Cardiovascular Status Stable, Respiratory Function Stable, Patent Airway and No signs of Nausea or vomiting  Post-op Vital Signs: stable  Complications: No apparent anesthesia complications  

## 2011-11-05 NOTE — Op Note (Signed)
Preoperative diagnosis-  Right knee medial meniscal tear  Postoperative diagnosis Right- knee medial meniscal tear   Plus Right medial chondral defect  Procedure- Right knee arthroscopy with medial  Meniscal debridement and chondroplasty  Surgeon- Gus Rankin. Zhavia Cunanan, MD  Anesthesia-General  EBL-  minimal Complications- None  Condition- PACU - hemodynamically stable.  Brief clinical note- -Jared Clark is a 75 y.o.  male with a several month history of right knee pain and mechanical symptoms. Exam and history suggested medial meniscal tear confirmed by MRI. He presents now for arthroscopy and debridement  Procedure in detail -       After successful administration of General anesthetic, a tourmiquet is placed high on the Right  thigh and the Right lower extremity is prepped and draped in the usual sterile fashion. Time out is performed by the surgical team. Standard superomedial and inferolateral portal sites are marked and incisions made with an 11 blade. The inflow cannula is passed through the superomedial portal and camera through the inferolateral portal and inflow is initiated. Arthroscopic visualization proceeds.      The undersurface of the patella and trochlea are visualized and there is evidence of bone on bone changes in the patellofemoral joint. The medial and lateral gutters are visualized and there are  no loose bodies. Flexion and valgus force is applied to the knee and the medial compartment is entered. A spinal needle is passed into the joint through the site marked for the inferomedial portal. A small incision is made and the dilator passed into the joint. The findings for the medial compartment are unstable tear in the posterior horn of the medial meniscus and unstable chondral defect on the medial femoral condyle approximately 2 x 1 cm in size.The tear is debrided to a stable base with baskets and a shaver and sealed off with the Arthrocare. The shaver is used to debride  the unstable cartilage to a stable cartilaginous base with stable edges. It is probed and found to be stable.    The intercondylar notch is visualized and the ACL appears normal. The lateral compartment is entered and the findings are no evidence of tears or chondral defects.     The joint is again inspected and there are no other tears, defects or loose bodies identified. The arthroscopic equipment is then removed from the inferior portals which are closed with interrupted 4-0 nylon. 20 ml of .25% Marcaine with epinephrine are injected through the inflow cannula and the cannula is then removed and the portal closed with nylon. The incisions are cleaned and dried and a bulky sterile dressing is applied. The patient is then awakened and transported to recovery in stable condition.   11/05/2011, 12:03 PM

## 2011-11-05 NOTE — Anesthesia Procedure Notes (Signed)
Procedure Name: LMA Insertion Date/Time: 11/05/2011 11:30 AM Performed by: Lorrin Jackson Pre-anesthesia Checklist: Patient identified, Emergency Drugs available, Suction available and Patient being monitored Patient Re-evaluated:Patient Re-evaluated prior to inductionOxygen Delivery Method: Circle System Utilized Preoxygenation: Pre-oxygenation with 100% oxygen Intubation Type: IV induction Ventilation: Mask ventilation without difficulty LMA: LMA with gastric port inserted LMA Size: 4.0 Number of attempts: 1 Placement Confirmation: positive ETCO2 Tube secured with: Tape Dental Injury: Teeth and Oropharynx as per pre-operative assessment

## 2011-11-05 NOTE — Progress Notes (Signed)
No EKG necessary per Dr Council Mechanic

## 2011-11-05 NOTE — Anesthesia Postprocedure Evaluation (Signed)
  Anesthesia Post-op Note  Patient: Jared Clark  Procedure(s) Performed:  ARTHROSCOPY KNEE - RIGHT ARTHROSCOPY KNEE WITH DEBRIDEMENT, Chondroplasty  Patient Location: PACU  Anesthesia Type: General  Level of Consciousness: awake and alert   Airway and Oxygen Therapy: Patient Spontanous Breathing  Post-op Pain: mild  Post-op Assessment: Post-op Vital signs reviewed, Patient's Cardiovascular Status Stable, Respiratory Function Stable, Patent Airway and No signs of Nausea or vomiting  Post-op Vital Signs: stable  Complications: No apparent anesthesia complications

## 2011-11-10 ENCOUNTER — Encounter (HOSPITAL_BASED_OUTPATIENT_CLINIC_OR_DEPARTMENT_OTHER): Payer: Self-pay | Admitting: Orthopedic Surgery

## 2012-01-09 DIAGNOSIS — M23305 Other meniscus derangements, unspecified medial meniscus, unspecified knee: Secondary | ICD-10-CM | POA: Diagnosis not present

## 2012-01-28 DIAGNOSIS — H5210 Myopia, unspecified eye: Secondary | ICD-10-CM | POA: Diagnosis not present

## 2012-01-28 DIAGNOSIS — H2510 Age-related nuclear cataract, unspecified eye: Secondary | ICD-10-CM | POA: Diagnosis not present

## 2012-02-02 ENCOUNTER — Telehealth: Payer: Self-pay | Admitting: Cardiology

## 2012-02-02 NOTE — Telephone Encounter (Signed)
Pt calling re appt due with brackbill in April, however per dr Patty Sermons he found a pcp who he is now seeing him in April, so pt's questioning if he should move brackbills out 6 months and keep them 6 months apart???

## 2012-02-02 NOTE — Telephone Encounter (Signed)
Advised patient and put in recall appointment

## 2012-02-02 NOTE — Telephone Encounter (Signed)
Will forward to  [name] to see how often he wants to see Dr Perlie Gold

## 2012-02-02 NOTE — Telephone Encounter (Signed)
Yes keep them 6 mo apart.

## 2012-02-05 DIAGNOSIS — L03319 Cellulitis of trunk, unspecified: Secondary | ICD-10-CM | POA: Diagnosis not present

## 2012-02-05 DIAGNOSIS — I10 Essential (primary) hypertension: Secondary | ICD-10-CM | POA: Diagnosis not present

## 2012-03-12 ENCOUNTER — Encounter: Payer: Self-pay | Admitting: Cardiology

## 2012-03-12 DIAGNOSIS — R7301 Impaired fasting glucose: Secondary | ICD-10-CM | POA: Diagnosis not present

## 2012-03-12 DIAGNOSIS — I10 Essential (primary) hypertension: Secondary | ICD-10-CM | POA: Diagnosis not present

## 2012-03-12 DIAGNOSIS — E785 Hyperlipidemia, unspecified: Secondary | ICD-10-CM | POA: Diagnosis not present

## 2012-03-23 ENCOUNTER — Encounter: Payer: Self-pay | Admitting: Cardiology

## 2012-03-23 DIAGNOSIS — Z1212 Encounter for screening for malignant neoplasm of rectum: Secondary | ICD-10-CM | POA: Diagnosis not present

## 2012-03-23 LAB — IFOBT (OCCULT BLOOD): IFOBT: NEGATIVE

## 2012-03-26 DIAGNOSIS — R5381 Other malaise: Secondary | ICD-10-CM | POA: Diagnosis not present

## 2012-03-26 DIAGNOSIS — E785 Hyperlipidemia, unspecified: Secondary | ICD-10-CM | POA: Diagnosis not present

## 2012-03-26 DIAGNOSIS — F329 Major depressive disorder, single episode, unspecified: Secondary | ICD-10-CM | POA: Diagnosis not present

## 2012-03-26 DIAGNOSIS — Z0000 Encounter for general adult medical examination without abnormal findings: Secondary | ICD-10-CM | POA: Diagnosis not present

## 2012-03-26 DIAGNOSIS — Z23 Encounter for immunization: Secondary | ICD-10-CM | POA: Diagnosis not present

## 2012-03-26 DIAGNOSIS — I10 Essential (primary) hypertension: Secondary | ICD-10-CM | POA: Diagnosis not present

## 2012-03-26 DIAGNOSIS — R5383 Other fatigue: Secondary | ICD-10-CM | POA: Diagnosis not present

## 2012-03-30 DIAGNOSIS — R51 Headache: Secondary | ICD-10-CM | POA: Diagnosis not present

## 2012-04-23 DIAGNOSIS — L821 Other seborrheic keratosis: Secondary | ICD-10-CM | POA: Diagnosis not present

## 2012-04-23 DIAGNOSIS — D239 Other benign neoplasm of skin, unspecified: Secondary | ICD-10-CM | POA: Diagnosis not present

## 2012-04-28 DIAGNOSIS — I6529 Occlusion and stenosis of unspecified carotid artery: Secondary | ICD-10-CM | POA: Diagnosis not present

## 2012-04-28 DIAGNOSIS — I259 Chronic ischemic heart disease, unspecified: Secondary | ICD-10-CM | POA: Diagnosis not present

## 2012-04-28 DIAGNOSIS — I10 Essential (primary) hypertension: Secondary | ICD-10-CM | POA: Diagnosis not present

## 2012-04-28 DIAGNOSIS — Z23 Encounter for immunization: Secondary | ICD-10-CM | POA: Diagnosis not present

## 2012-05-12 ENCOUNTER — Ambulatory Visit (INDEPENDENT_AMBULATORY_CARE_PROVIDER_SITE_OTHER): Payer: Medicare Other | Admitting: *Deleted

## 2012-05-12 DIAGNOSIS — I6529 Occlusion and stenosis of unspecified carotid artery: Secondary | ICD-10-CM

## 2012-05-17 NOTE — Procedures (Unsigned)
CAROTID DUPLEX EXAM  INDICATION:  Carotid artery stenosis.  HISTORY: Diabetes:  No Cardiac:  Yes Hypertension:  Yes Smoking:  No Previous Surgery:  No CV History:  Currently asymptomatic Amaurosis Fugax No, Paresthesias No, Hemiparesis No                                      RIGHT             LEFT Brachial systolic pressure:         122               120 Brachial Doppler waveforms:         Normal            Normal Vertebral direction of flow:        Antegrade         Antegrade DUPLEX VELOCITIES (cm/sec) CCA peak systolic                   82                75 ECA peak systolic                   76                84 ICA peak systolic                   135               48 ICA end diastolic                   42                17 PLAQUE MORPHOLOGY:                  Heterogeneous     Heterogeneous PLAQUE AMOUNT:                      Moderate          Minimal PLAQUE LOCATION:                    ICA               Distal CCA/bifurcation  IMPRESSION: 1. Doppler velocity suggests 40 to 59% stenosis of the right proximal     internal carotid artery. 2. No hemodynamically significant stenosis noted in the left internal     carotid artery. 3. Doppler velocities of the right internal carotid artery appear less     than previously recorded when compared to the previous exam on     10/20/2011, with the left ICA velocities remaining stable.  ___________________________________________ Fransisco Hertz, MD  CH/MEDQ  D:  05/12/2012  T:  05/12/2012  Job:  [id number]

## 2012-07-07 DIAGNOSIS — M722 Plantar fascial fibromatosis: Secondary | ICD-10-CM | POA: Diagnosis not present

## 2012-07-14 DIAGNOSIS — M79609 Pain in unspecified limb: Secondary | ICD-10-CM | POA: Diagnosis not present

## 2012-07-20 DIAGNOSIS — M722 Plantar fascial fibromatosis: Secondary | ICD-10-CM | POA: Diagnosis not present

## 2012-07-22 DIAGNOSIS — M722 Plantar fascial fibromatosis: Secondary | ICD-10-CM | POA: Diagnosis not present

## 2012-07-27 DIAGNOSIS — M722 Plantar fascial fibromatosis: Secondary | ICD-10-CM | POA: Diagnosis not present

## 2012-07-29 DIAGNOSIS — M722 Plantar fascial fibromatosis: Secondary | ICD-10-CM | POA: Diagnosis not present

## 2012-08-03 DIAGNOSIS — M722 Plantar fascial fibromatosis: Secondary | ICD-10-CM | POA: Diagnosis not present

## 2012-08-05 DIAGNOSIS — M722 Plantar fascial fibromatosis: Secondary | ICD-10-CM | POA: Diagnosis not present

## 2012-08-10 DIAGNOSIS — M722 Plantar fascial fibromatosis: Secondary | ICD-10-CM | POA: Diagnosis not present

## 2012-08-12 DIAGNOSIS — M722 Plantar fascial fibromatosis: Secondary | ICD-10-CM | POA: Diagnosis not present

## 2012-08-13 ENCOUNTER — Encounter: Payer: Self-pay | Admitting: Cardiology

## 2012-08-13 ENCOUNTER — Ambulatory Visit (INDEPENDENT_AMBULATORY_CARE_PROVIDER_SITE_OTHER): Payer: Medicare Other | Admitting: Cardiology

## 2012-08-13 VITALS — BP 128/82 | HR 92 | Ht 77.0 in | Wt 253.0 lb

## 2012-08-13 DIAGNOSIS — I119 Hypertensive heart disease without heart failure: Secondary | ICD-10-CM

## 2012-08-13 DIAGNOSIS — I259 Chronic ischemic heart disease, unspecified: Secondary | ICD-10-CM | POA: Diagnosis not present

## 2012-08-13 DIAGNOSIS — E785 Hyperlipidemia, unspecified: Secondary | ICD-10-CM | POA: Diagnosis not present

## 2012-08-13 MED ORDER — METOPROLOL TARTRATE 25 MG PO TABS: ORAL_TABLET | ORAL | Status: DC

## 2012-08-13 NOTE — Progress Notes (Signed)
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 He has a history of known ischemic heart disease. In September of 2003. He underwent stenting of his first obtuse marginal vessel. He had a repeat cardiac catheterization in March 2010 after an abnormal stress Cardiolite. The cardiac catheterization showed long term patency of the prior stent and nonobstructive atherosclerotic disease elsewhere and he had normal left ventricular function. His last nuclear stress test was 11/13/10 and showed no ischemia and fair exercise capacity with a hypertensive blood pressure response and his ejection fraction was 58%. The patient comes in now because he has been experiencing some intermittent sided chest discomfort for the past week or 2.  The episodes have been occurring at rest rather than with exertion.  It is described as a dull ache.  At times there is radiation to the upper jaw bilaterally.  2 days ago he had an episode which lasted about 15 minutes and radiated down the inside of his left arm.  Of note is the fact that the patient has not recently been on any beta blocker.  He previously had been on metoprolol and subsequently that was switched to Bystolic by his primary care provider.  Subsequently it was stopped altogether.  The patient has noted that his resting heart rate has been higher recently.  Current Outpatient Prescriptions  Medication Sig Dispense Refill  . alum & mag hydroxide-simeth (MAALOX/MYLANTA) 200-200-20 MG/5ML suspension Take by mouth as needed.        [name] amLODipine (NORVASC) 5 MG tablet Daily.      [name] aspirin 81 MG tablet Take 81 mg by mouth daily.       [name] atorvastatin (LIPITOR) 10 MG tablet Take 10 mg by mouth every morning.       . clopidogrel (PLAVIX) 75 MG tablet  Take 75 mg by mouth daily. takes 3-4 times/week      . diazepam (VALIUM) 5 MG tablet Take 5 mg by mouth every 12 (twelve) hours as needed.       . finasteride (PROSCAR) 5 MG tablet Take 5 mg by mouth daily.       . naproxen (NAPROSYN) 250 MG tablet Take 250 mg by mouth 2 (two) times daily with a meal.        . omeprazole (PRILOSEC) 20 MG capsule Take 20 mg by mouth daily.        . ramipril (ALTACE) 2.5 MG capsule Take 2.5 mg by mouth daily.       . valACYclovir (VALTREX) 1000 MG tablet Take 1,000 mg by mouth as needed.       . zolpidem (AMBIEN) 5 MG tablet Take 5 mg by mouth at bedtime as needed.       . metoprolol tartrate (LOPRESSOR) 25 MG tablet 1/2 tablet daily  90 tablet  3    Allergies  Allergen Reactions  . Methocarbamol Swelling  . Orphenadrine Citrate Swelling    Patient Active Problem List  Diagnosis  . Ischemic heart disease  . Dyslipidemia  . BPH (benign prostatic hyperplasia)  . Malaise and fatigue  . Benign hypertensive heart disease without heart failure    History  Smoking status  . Never Smoker   Smokeless tobacco  . Never Used    History  Alcohol Use  . Yes    RARE    Family History  Problem Relation Age of Onset  . Cancer Father     Lung Cancer    Review of Systems: Constitutional: no fever chills diaphoresis or fatigue or change in weight.  Head and neck: no hearing loss, no epistaxis, no photophobia or visual disturbance. Respiratory: No cough, shortness of breath or wheezing. Cardiovascular: No chest pain peripheral edema, palpitations. Gastrointestinal: No abdominal distention, no abdominal pain, no change in bowel habits hematochezia or melena. Genitourinary: No dysuria, no frequency, no urgency, no nocturia. Musculoskeletal:No arthralgias, no back pain, no gait disturbance or myalgias. Neurological: No dizziness, no headaches, no numbness, no seizures, no syncope, no weakness, no tremors. Hematologic: No lymphadenopathy, no easy  bruising. Psychiatric: No confusion, no hallucinations, no sleep disturbance.    Physical Exam: Filed Vitals:   08/13/12 1048  BP: 128/82  Pulse: 92   the general appearance reveals a well-developed well-nourished large gentleman in no distress.The head and neck exam reveals pupils equal and reactive.  Extraocular movements are full.  There is no scleral icterus.  The mouth and pharynx are normal.  The neck is supple.  The carotids reveal no bruits.  The jugular venous pressure is normal.  The  thyroid is not enlarged.  There is no lymphadenopathy.  The chest is clear to percussion and auscultation.  There are no rales or rhonchi.  Expansion of the chest is symmetrical.  The precordium is quiet.  The first heart sound is normal.  The second heart sound is physiologically split.  There is no murmur gallop rub or click.  There is no abnormal lift or heave.  The abdomen is soft and nontender.  The bowel sounds are normal.  The liver and spleen are not enlarged.  There are no abdominal masses.  There are no abdominal bruits.  Extremities reveal good pedal pulses.  There is no phlebitis or edema.  There is no cyanosis or clubbing.  Strength is normal and symmetrical in all extremities.  There is no lateralizing weakness.  There are no sensory deficits.  The skin is warm and dry.  There is no rash.  EKG today shows normal sinus rhythm with a rate of 92 at rest and downsloping ST segments in leads 1 and aVL   Assessment / Plan: The patient will return soon for a treadmill Myoview stress test.

## 2012-08-13 NOTE — Assessment & Plan Note (Signed)
The symptoms of recurrent chest tightness are concerning.  We discussed various diagnostic options.  We have elected to proceed with a treadmill Myoview. The patient wants to go back on beta blocker and we will choose low-dose metoprolol tartrate 25 mg taking one half tablet daily which as he recalls is what he tolerated test in the past.  He will not take his beta blocker on the morning of the treadmill stress test.

## 2012-08-13 NOTE — Assessment & Plan Note (Signed)
Patient has a history of dyslipidemia and is on low-dose Lipitor.  Not having any myalgias from Lipitor

## 2012-08-13 NOTE — Patient Instructions (Addendum)
Your physician recommends that you schedule a follow-up appointment in: 1 month  Start Metoprolol Tartrate 25 mg 1/2 tablet daily  Your physician has requested that you have en exercise stress myoview. For further information please visit [email or web address] Please follow instruction sheet, as given.

## 2012-08-13 NOTE — Assessment & Plan Note (Signed)
Blood pressure has been remaining stable on his current regimen of ramipril and amlodipine.  We are now adding low-dose metoprolol tartrate 4 cardiac protection and rate control.

## 2012-08-18 ENCOUNTER — Ambulatory Visit (HOSPITAL_COMMUNITY): Payer: Medicare Other | Attending: Cardiology | Admitting: Radiology

## 2012-08-18 VITALS — BP 124/70 | Ht 76.0 in | Wt 250.0 lb

## 2012-08-18 DIAGNOSIS — R0609 Other forms of dyspnea: Secondary | ICD-10-CM | POA: Diagnosis not present

## 2012-08-18 DIAGNOSIS — R5381 Other malaise: Secondary | ICD-10-CM | POA: Diagnosis not present

## 2012-08-18 DIAGNOSIS — I10 Essential (primary) hypertension: Secondary | ICD-10-CM | POA: Diagnosis not present

## 2012-08-18 DIAGNOSIS — R079 Chest pain, unspecified: Secondary | ICD-10-CM | POA: Insufficient documentation

## 2012-08-18 DIAGNOSIS — I259 Chronic ischemic heart disease, unspecified: Secondary | ICD-10-CM

## 2012-08-18 DIAGNOSIS — R0989 Other specified symptoms and signs involving the circulatory and respiratory systems: Secondary | ICD-10-CM | POA: Insufficient documentation

## 2012-08-18 MED ORDER — TECHNETIUM TC 99M SESTAMIBI GENERIC - CARDIOLITE
11.0000 | Freq: Once | INTRAVENOUS | Status: AC | PRN
  Administered 2012-08-18: 11 via INTRAVENOUS

## 2012-08-18 MED ORDER — TECHNETIUM TC 99M SESTAMIBI GENERIC - CARDIOLITE
33.0000 | Freq: Once | INTRAVENOUS | Status: AC | PRN
  Administered 2012-08-18: 33 via INTRAVENOUS

## 2012-08-18 NOTE — Progress Notes (Signed)
[facility] SITE 3 NUCLEAR MED [address] [place] [address] [phone]  Cardiology Nuclear Med Study  Jared Clark is a 76 y.o. male     MRN : [id number]     DOB: Apr 06, 1936  Procedure Date: 08/18/2012  Nuclear Med Background Indication for Stress Test:  Evaluation for Ischemia and Stent Patency History:  2003 Stents: 1st OM, 2006, ECHO: EF: 55-65% 02/2009 Heart Cath: patent stent EF: 60% N/O DZ elsewhere 11/2010 MPS: EF: 58% (-) ischemia  Cardiac Risk Factors: Carotid Disease, Hypertension and Lipids  Symptoms:  Chest Pain, DOE, Fatigue and SOB   Nuclear Pre-Procedure Caffeine/Decaff Intake:  None NPO After: 8:30am   Lungs:  clear O2 Sat: 96% on room air. IV 0.9% NS with Angio Cath:  20g  IV Site: R Hand  IV Started by:  Bonnita Levan, RN  Chest Size (in):  50 Cup Size: n/a  Height: 6\' 4"  (1.93 m)  Weight:  250 lb (113.399 kg)  BMI:  Body mass index is 30.43 kg/(m^2). Tech Comments:  Patient held Lopressor x 24 hrs    Nuclear Med Study 1 or 2 day study: 1 day  Stress Test Type:  Stress  Reading MD: Willa Rough, MD  Order Authorizing Provider:  [name], MD  Resting Radionuclide: Technetium 5m Sestamibi  Resting Radionuclide Dose:  11.0 mCi   Stress Radionuclide:  Technetium 33m Sestamibi  Stress Radionuclide Dose: 33.0 mCi           Stress Protocol Rest HR: 67 Stress HR: 142  Rest BP: 124/70 Stress BP: 208/86  Exercise Time (min): 4:00 METS: 5.80   Predicted Max HR: 146 bpm % Max HR: 97.26 bpm Rate Pressure Product: 21308   Dose of Adenosine (mg):  n/a Dose of Lexiscan: n/a mg  Dose of Atropine (mg): n/a Dose of Dobutamine: n/a mcg/kg/min (at max HR)  Stress Test Technologist: Milana Na, EMT-P  Nuclear Technologist:  Domenic Polite, CNMT     Rest Procedure:  Myocardial perfusion imaging was performed at rest 45 minutes following the intravenous administration of Technetium 37m Sestamibi. Rest ECG: NSR - Normal  EKG  Stress Procedure:  The patient performed treadmill exercise using a Bruce  Protocol for 4:00 minutes. The patient stopped due to sob, chest discomfort and denied any chest pain.  There were non specific ST-T wave changes.  Technetium 33m Sestamibi was injected at peak exercise and myocardial perfusion imaging was performed after a brief delay. Stress ECG: No significant change from baseline ECG  QPS Raw Data Images:  Normal; no motion artifact; normal heart/lung ratio. Stress Images:  There is a small area of moderate decreased uptake at the base of the inferolateral wall. Rest Images:  The rest image looks the same as the stress image. Subtraction (SDS):  No evidence of ischemia. Transient Ischemic Dilatation (Normal <1.22):  1.04 Lung/Heart Ratio (Normal <0.45):  0.34  Quantitative Gated Spect Images QGS EDV:  93 ml QGS ESV:  40 ml  Impression Exercise Capacity:  limited exercise capacity. Patient did develop marked systolic hypertension shortly after exercise was started. There was some shortness of breath. BP Response:  Hypertensive blood pressure response. Clinical Symptoms:  shortness of breath ECG Impression:  No significant ST segment change suggestive of ischemia. Comparison with Prior Nuclear Study: No images to compare  Overall Impression:  The patient began exercise and had significant increase in blood pressure. There were no diagnostic EKG changes. The nuclear images reveal evidence of a  small scar at the base of the inferolateral wall. There is an associated wall motion abnormality. There is no significant ischemia.  LV Ejection Fraction: 57%.  LV Wall Motion:  Hypokinesis at the base of the inferior wall.  Willa Rough, MD

## 2012-08-20 ENCOUNTER — Telehealth: Payer: Self-pay | Admitting: Cardiology

## 2012-08-20 NOTE — Telephone Encounter (Signed)
Pt would like test results from stress test taken on Wednesday

## 2012-08-20 NOTE — Telephone Encounter (Signed)
Pt has another question, pls call [phone]

## 2012-08-20 NOTE — Telephone Encounter (Signed)
Patient states he is feeling better.  Did advise Lawson Fiscal NP reviewed stress test but would prefer for  [name] to make recommendations. Will forward to  [name] for review.  Patient did state he was exhausted after treadmill, only last 4 minutes

## 2012-08-20 NOTE — Telephone Encounter (Signed)
Discussed further with patient, will just wait for  [name] to review

## 2012-08-27 DIAGNOSIS — Z23 Encounter for immunization: Secondary | ICD-10-CM | POA: Diagnosis not present

## 2012-08-31 ENCOUNTER — Ambulatory Visit: Payer: Self-pay | Admitting: *Deleted

## 2012-08-31 ENCOUNTER — Telehealth: Payer: Self-pay | Admitting: *Deleted

## 2012-08-31 NOTE — Telephone Encounter (Signed)
Message copied by Burnell Blanks on Tue Aug 31, 2012 11:54 AM ------      Message from: [name]      Created: Sun Aug 29, 2012  3:22 PM       Please report.  The stress test shows no ischemia. Stent still patent. Old small inferolateral scar with normal EF as before.  Hypertensive response to exercise limited exercise capacity. To help BP he should increase lopressor to a whole 25 mg tab each am.  Lose more weight and walk regularly for exercise.

## 2012-08-31 NOTE — Telephone Encounter (Signed)
Advised patient

## 2012-09-03 ENCOUNTER — Encounter: Payer: Self-pay | Admitting: Cardiology

## 2012-09-09 ENCOUNTER — Encounter: Payer: Self-pay | Admitting: Cardiology

## 2012-09-09 ENCOUNTER — Ambulatory Visit (INDEPENDENT_AMBULATORY_CARE_PROVIDER_SITE_OTHER): Payer: Medicare Other | Admitting: Cardiology

## 2012-09-09 ENCOUNTER — Telehealth: Payer: Self-pay | Admitting: Cardiology

## 2012-09-09 VITALS — BP 130/83 | HR 64 | Ht 76.5 in | Wt 245.0 lb

## 2012-09-09 DIAGNOSIS — I259 Chronic ischemic heart disease, unspecified: Secondary | ICD-10-CM | POA: Diagnosis not present

## 2012-09-09 DIAGNOSIS — R079 Chest pain, unspecified: Secondary | ICD-10-CM | POA: Diagnosis not present

## 2012-09-09 DIAGNOSIS — I119 Hypertensive heart disease without heart failure: Secondary | ICD-10-CM

## 2012-09-09 DIAGNOSIS — E785 Hyperlipidemia, unspecified: Secondary | ICD-10-CM | POA: Diagnosis not present

## 2012-09-09 MED ORDER — METOPROLOL SUCCINATE ER 25 MG PO TB24: 25.0000 mg | ORAL_TABLET | Freq: Every day | ORAL | Status: DC

## 2012-09-09 MED ORDER — NITROGLYCERIN 0.4 MG SL SUBL: 0.4000 mg | SUBLINGUAL_TABLET | SUBLINGUAL | Status: DC | PRN

## 2012-09-09 NOTE — Assessment & Plan Note (Signed)
The patient has a past history of essential hypertension.  Blood pressure has improved since he has lost some weight.  He did have a hypertensive response to treadmill exercise.  To help with this we are stopping Lopressor and switching to generic Toprol XL 25 mg one daily which may be smoother and have less side effects than the metoprolol tartrate.

## 2012-09-09 NOTE — Telephone Encounter (Signed)
Spoke with pt, questions regarding printed at appt today

## 2012-09-09 NOTE — Telephone Encounter (Signed)
Follow-up:    Patient called wanting to speak about his medications again.  Please call back.

## 2012-09-09 NOTE — Telephone Encounter (Signed)
plz return call to patient at [phone] regarding questions about his RX

## 2012-09-09 NOTE — Assessment & Plan Note (Signed)
The patient has a history of dyslipidemia and is on Lipitor.  He is not having any myalgias or side effects

## 2012-09-09 NOTE — Progress Notes (Signed)
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 He has a history of known ischemic heart disease. In September of 2003. He underwent stenting of his first obtuse marginal vessel. He had a repeat cardiac catheterization in March 2010 after an abnormal stress Cardiolite. The cardiac catheterization showed long term patency of the prior stent and nonobstructive atherosclerotic disease elsewhere and he had normal left ventricular function.  He had a nuclear stress test on 11/13/10 and showed no ischemia and fair exercise capacity with a hypertensive blood pressure response and his ejection fraction was 58%.  The patient was seen as a work in in September because he has been experiencing some intermittent sided chest discomfort for the past week or 2. The episodes have been occurring at rest rather than with exertion. It is described as a dull ache. At times there is radiation to the upper jaw bilaterally. 2 days ago he had an episode which lasted about 15 minutes and radiated down the inside of his left arm. Of note is the fact that the patient has not recently been on any beta blocker. He previously had been on metoprolol and subsequently that was switched to Bystolic by his primary care provider. Subsequently it was stopped altogether. The patient has noted that his resting heart rate has been higher recently.  The patient underwent a treadmill Myoview stress test on 08/18/12.  He had a hypertensive response to exercise.  The stress test showed no ischemia.  There was a possible small scar in the basal inferolateral wall on both the stress and rest images.  His overall ejection fraction was normal at 57%. Since then the patient has been doing well.  He is making a serious effort to lose  weight.  His weight is down another 8 pounds and he is using the new Atkins diet.   Current Outpatient Prescriptions  Medication Sig Dispense Refill  . alum & mag hydroxide-simeth (MAALOX/MYLANTA) 200-200-20 MG/5ML suspension Take by mouth as needed.        [name] amLODipine (NORVASC) 5 MG tablet Daily.      [name] aspirin 81 MG tablet Take 81 mg by mouth daily.       [name] atorvastatin (LIPITOR) 10 MG tablet Take 10 mg by mouth every morning.       . clopidogrel (PLAVIX) 75 MG tablet Take 75 mg by mouth daily. takes 3-4 times/week      . diazepam (VALIUM) 5 MG tablet Take 5 mg by mouth every 12 (twelve) hours as needed.       . finasteride (PROSCAR) 5 MG tablet Take 5 mg by mouth daily.       . naproxen (NAPROSYN) 250 MG tablet Take 250 mg by mouth 2 (two) times daily with a meal.        . omeprazole (PRILOSEC) 20 MG capsule Take 20 mg by mouth daily.        . ramipril (ALTACE) 2.5 MG capsule Take 2.5 mg by mouth daily.       . valACYclovir (VALTREX) 1000 MG tablet Take 1,000 mg by mouth as needed.       . zolpidem (AMBIEN) 5 MG tablet Take 5 mg by mouth at bedtime as needed.       . metoprolol succinate (  TOPROL XL) 25 MG 24 hr tablet Take 1 tablet (25 mg total) by mouth daily.  90 tablet  3  . nitroGLYCERIN (NITROSTAT) 0.4 MG SL tablet Place 1 tablet (0.4 mg total) under the tongue every 5 (five) minutes as needed for chest pain.  25 tablet  6    Allergies  Allergen Reactions  . Methocarbamol Swelling  . Orphenadrine Citrate Swelling    Patient Active Problem List  Diagnosis  . Ischemic heart disease  . Dyslipidemia  . BPH (benign prostatic hyperplasia)  . Malaise and fatigue  . Benign hypertensive heart disease without heart failure    History  Smoking status  . Never Smoker   Smokeless tobacco  . Never Used    History  Alcohol Use  . Yes    RARE    Family History  Problem Relation Age of Onset  . Cancer Father     Lung Cancer    Review of Systems: Constitutional: no  fever chills diaphoresis or fatigue or change in weight.  Head and neck: no hearing loss, no epistaxis, no photophobia or visual disturbance. Respiratory: No cough, shortness of breath or wheezing. Cardiovascular: No chest pain peripheral edema, palpitations. Gastrointestinal: No abdominal distention, no abdominal pain, no change in bowel habits hematochezia or melena. Genitourinary: No dysuria, no frequency, no urgency, no nocturia. Musculoskeletal:No arthralgias, no back pain, no gait disturbance or myalgias. Neurological: No dizziness, no headaches, no numbness, no seizures, no syncope, no weakness, no tremors. Hematologic: No lymphadenopathy, no easy bruising. Psychiatric: No confusion, no hallucinations, no sleep disturbance.    Physical Exam: Filed Vitals:   09/09/12 1135  BP: 130/83  Pulse: 64   the general appearance reveals a well-developed well-nourished large gentleman in no distress.The head and neck exam reveals pupils equal and reactive.  Extraocular movements are full.  There is no scleral icterus.  The mouth and pharynx are normal.  The neck is supple.  The carotids reveal no bruits.  The jugular venous pressure is normal.  The  thyroid is not enlarged.  There is no lymphadenopathy.  The chest is clear to percussion and auscultation.  There are no rales or rhonchi.  Expansion of the chest is symmetrical.  The precordium is quiet.  The first heart sound is normal.  The second heart sound is physiologically split.  There is no murmur gallop rub or click.  There is no abnormal lift or heave.  The abdomen is soft and nontender.  The bowel sounds are normal.  The liver and spleen are not enlarged.  There are no abdominal masses.  There are no abdominal bruits.  Extremities reveal good pedal pulses.  There is no phlebitis or edema.  There is no cyanosis or clubbing.  Strength is normal and symmetrical in all extremities.  There is no lateralizing weakness.  There are no sensory deficits.   The skin is warm and dry.  There is no rash.     Assessment / Plan:  Continue same medication.  Recheck in mid November for followup office visit at his request to see how he is doing on the Toprol.  He will then be having an appointment with his regular care physician in about 6 months.  He is aiming for an eventual weight of 225-230 pounds

## 2012-09-09 NOTE — Assessment & Plan Note (Signed)
His energy level has improved since he has lost weight and he has been able to play golf

## 2012-09-09 NOTE — Assessment & Plan Note (Signed)
The patient is having occasional atypical chest pain.  It does not appear to be related to exertion.  He is wondering whether some of a previous chest pains could have been related to reflux and he intends to call his gastroenterologist Dr. Kinnie Scales for an evaluation.

## 2012-09-09 NOTE — Telephone Encounter (Signed)
Spoke with pt, questions answered.

## 2012-09-09 NOTE — Patient Instructions (Addendum)
Stop Lopressor   Start Generic Toprol XL 25 mg 1 every day   NTG prescription sent to [facility]   Your physician recommends that you schedule a follow-up appointment in: 1 to 2 months

## 2012-09-29 DIAGNOSIS — J0100 Acute maxillary sinusitis, unspecified: Secondary | ICD-10-CM | POA: Diagnosis not present

## 2012-10-13 ENCOUNTER — Ambulatory Visit: Payer: Medicare Other | Admitting: Cardiology

## 2012-10-26 ENCOUNTER — Encounter: Payer: Self-pay | Admitting: Cardiology

## 2012-10-26 ENCOUNTER — Ambulatory Visit (INDEPENDENT_AMBULATORY_CARE_PROVIDER_SITE_OTHER): Payer: Medicare Other | Admitting: Cardiology

## 2012-10-26 VITALS — BP 138/70 | HR 60 | Ht 76.5 in | Wt 251.0 lb

## 2012-10-26 DIAGNOSIS — E785 Hyperlipidemia, unspecified: Secondary | ICD-10-CM | POA: Diagnosis not present

## 2012-10-26 DIAGNOSIS — G4700 Insomnia, unspecified: Secondary | ICD-10-CM | POA: Diagnosis not present

## 2012-10-26 DIAGNOSIS — I119 Hypertensive heart disease without heart failure: Secondary | ICD-10-CM | POA: Diagnosis not present

## 2012-10-26 DIAGNOSIS — I259 Chronic ischemic heart disease, unspecified: Secondary | ICD-10-CM | POA: Diagnosis not present

## 2012-10-26 MED ORDER — ZOLPIDEM TARTRATE 5 MG PO TABS: 5.0000 mg | ORAL_TABLET | Freq: Every evening | ORAL | Status: DC | PRN

## 2012-10-26 MED ORDER — DIAZEPAM 5 MG PO TABS: 5.0000 mg | ORAL_TABLET | Freq: Two times a day (BID) | ORAL | Status: DC | PRN

## 2012-10-26 NOTE — Assessment & Plan Note (Signed)
Blood pressure readings were brought from home which showed that his blood pressure at home has been satisfactory

## 2012-10-26 NOTE — Assessment & Plan Note (Signed)
The patient is on Lipitor 10 mg daily.  He's not having any side effects.  Dr. Clelia Croft has been following his lipids

## 2012-10-26 NOTE — Progress Notes (Signed)
Jared Clark Date of Birth:  02-Jul-1936 [facility] 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 He has a history of known ischemic heart disease. In September of 2003. He underwent stenting of his first obtuse marginal vessel. He had a repeat cardiac catheterization in March 2010 after an abnormal stress Cardiolite. The cardiac catheterization showed long term patency of the prior stent and nonobstructive atherosclerotic disease elsewhere and he had normal left ventricular function. He had a nuclear stress test on 11/13/10 and showed no ischemia and fair exercise capacity with a hypertensive blood pressure response and his ejection fraction was 58%.  The patient was seen as a work in in September because he has been experiencing some intermittent sided chest discomfort for the past week or 2. The episodes have been occurring at rest rather than with exertion. The patient underwent a treadmill Myoview stress test on 08/18/12. He had a hypertensive response to exercise. The stress test showed no ischemia. There was a possible small scar in the basal inferolateral wall on both the stress and rest images. His overall ejection fraction was normal at 57%.  Since then the patient has been doing well.  He has unfortunately gained back some weight since last visit.  Is not having any recurrent chest pains   Current Outpatient Prescriptions  Medication Sig Dispense Refill  . alum & mag hydroxide-simeth (MAALOX/MYLANTA) 200-200-20 MG/5ML suspension Take by mouth as needed.        [name] aspirin 81 MG tablet Take 81 mg by mouth daily.       [name] atorvastatin (LIPITOR) 10 MG tablet Take 10 mg by mouth every morning.       . clopidogrel (PLAVIX) 75 MG tablet Take 75 mg by mouth daily. takes 3-4 times/week      . diazepam (VALIUM) 5 MG tablet Take 1 tablet (5 mg total) by mouth  every 12 (twelve) hours as needed.  180 tablet  1  . finasteride (PROSCAR) 5 MG tablet Take 5 mg by mouth daily.       . metoprolol succinate (TOPROL XL) 25 MG 24 hr tablet Take 1 tablet (25 mg total) by mouth daily.  90 tablet  3  . naproxen (NAPROSYN) 250 MG tablet Take 250 mg by mouth 2 (two) times daily with a meal.        . nitroGLYCERIN (NITROSTAT) 0.4 MG SL tablet Place 1 tablet (0.4 mg total) under the tongue every 5 (five) minutes as needed for chest pain.  25 tablet  6  . omeprazole (PRILOSEC) 20 MG capsule Take 20 mg by mouth daily.        . ramipril (ALTACE) 2.5 MG capsule Take 2.5 mg by mouth daily.       . valACYclovir (VALTREX) 1000 MG tablet Take 1,000 mg by mouth as needed.       . zolpidem (AMBIEN) 5 MG tablet Take 1 tablet (5 mg total) by mouth at bedtime as needed.  90 tablet  1  . [DISCONTINUED] zolpidem (AMBIEN) 5 MG tablet Take 5 mg by mouth at bedtime as needed.         Allergies  Allergen Reactions  . Methocarbamol Swelling  . Orphenadrine Citrate Swelling    Patient Active Problem List  Diagnosis  . Ischemic heart disease  . Dyslipidemia  . BPH (benign prostatic hyperplasia)  . Malaise and fatigue  .  Benign hypertensive heart disease without heart failure    History  Smoking status  . Never Smoker   Smokeless tobacco  . Never Used    History  Alcohol Use  . Yes    Comment: RARE    Family History  Problem Relation Age of Onset  . Cancer Father     Lung Cancer    Review of Systems: The patient denies any heat or cold intolerance.  No weight gain or weight loss.  The patient denies headaches or blurry vision.  There is no cough or sputum production.  The patient denies dizziness.  There is no hematuria or hematochezia.  The patient denies any muscle aches or arthritis.  The patient denies any rash.  The patient denies frequent falling or instability.  There is no history of depression or anxiety.  All other systems were reviewed and are  negative.   Physical Exam: Filed Vitals:   10/26/12 1021  BP: 138/70  Pulse: 60   the general appearance reveals a well-developed well-nourished gentleman in no distress.  His weight is up 6 pounds since last visit.The head and neck exam reveals pupils equal and reactive.  Extraocular movements are full.  There is no scleral icterus.  The mouth and pharynx are normal.  The neck is supple.  The carotids reveal no bruits.  The jugular venous pressure is normal.  The  thyroid is not enlarged.  There is no lymphadenopathy.  The chest is clear to percussion and auscultation.  There are no rales or rhonchi.  Expansion of the chest is symmetrical.  The precordium is quiet.  The first heart sound is normal.  The second heart sound is physiologically split.  There is no murmur gallop rub or click.  There is no abnormal lift or heave.  The abdomen is soft and nontender.  The bowel sounds are normal.  The liver and spleen are not enlarged.  There are no abdominal masses.  There are no abdominal bruits.  Extremities reveal good pedal pulses.  There is no phlebitis or edema.  There is no cyanosis or clubbing.  Strength is normal and symmetrical in all extremities.  There is no lateralizing weakness.  There are no sensory deficits.  The skin is warm and dry.  There is no rash.      Assessment / Plan: Continue same medication.  He will be seeing Dr. Clelia Croft in June and we will plan to see him next autumn

## 2012-10-26 NOTE — Patient Instructions (Addendum)
Your physician recommends that you continue on your current medications as directed. Please refer to the Current Medication list given to you today.  Your physician wants you to follow-up in: in April  You will receive a reminder letter in the mail two months in advance. If you don't receive a letter, please call our office to schedule the follow-up appointment.  

## 2012-10-26 NOTE — Assessment & Plan Note (Signed)
The patient has not been expressing any recurrent chest pain or angina. 

## 2012-10-30 ENCOUNTER — Encounter: Payer: Self-pay | Admitting: Cardiology

## 2012-11-03 ENCOUNTER — Encounter: Payer: Self-pay | Admitting: Cardiology

## 2012-11-09 ENCOUNTER — Encounter: Payer: Self-pay | Admitting: Cardiology

## 2012-11-22 DIAGNOSIS — B009 Herpesviral infection, unspecified: Secondary | ICD-10-CM | POA: Diagnosis not present

## 2012-11-22 DIAGNOSIS — N320 Bladder-neck obstruction: Secondary | ICD-10-CM | POA: Diagnosis not present

## 2012-11-22 DIAGNOSIS — N400 Enlarged prostate without lower urinary tract symptoms: Secondary | ICD-10-CM | POA: Diagnosis not present

## 2012-11-22 HISTORY — DX: Herpesviral infection, unspecified: B00.9

## 2013-01-06 ENCOUNTER — Emergency Department (HOSPITAL_COMMUNITY): Payer: No Typology Code available for payment source

## 2013-01-06 ENCOUNTER — Emergency Department (HOSPITAL_COMMUNITY)
Admission: EM | Admit: 2013-01-06 | Discharge: 2013-01-06 | Disposition: A | Discharge status: Home / Self Care | Payer: No Typology Code available for payment source | Attending: Emergency Medicine | Admitting: Emergency Medicine

## 2013-01-06 ENCOUNTER — Encounter (HOSPITAL_COMMUNITY): Payer: Self-pay

## 2013-01-06 DIAGNOSIS — Y9389 Activity, other specified: Secondary | ICD-10-CM | POA: Insufficient documentation

## 2013-01-06 DIAGNOSIS — M79609 Pain in unspecified limb: Secondary | ICD-10-CM | POA: Diagnosis not present

## 2013-01-06 DIAGNOSIS — I10 Essential (primary) hypertension: Secondary | ICD-10-CM | POA: Insufficient documentation

## 2013-01-06 DIAGNOSIS — S8010XA Contusion of unspecified lower leg, initial encounter: Secondary | ICD-10-CM | POA: Insufficient documentation

## 2013-01-06 DIAGNOSIS — Z9889 Other specified postprocedural states: Secondary | ICD-10-CM | POA: Insufficient documentation

## 2013-01-06 DIAGNOSIS — K219 Gastro-esophageal reflux disease without esophagitis: Secondary | ICD-10-CM | POA: Insufficient documentation

## 2013-01-06 DIAGNOSIS — IMO0002 Reserved for concepts with insufficient information to code with codable children: Secondary | ICD-10-CM | POA: Diagnosis not present

## 2013-01-06 DIAGNOSIS — Z8739 Personal history of other diseases of the musculoskeletal system and connective tissue: Secondary | ICD-10-CM | POA: Insufficient documentation

## 2013-01-06 DIAGNOSIS — Z79899 Other long term (current) drug therapy: Secondary | ICD-10-CM | POA: Insufficient documentation

## 2013-01-06 DIAGNOSIS — M25569 Pain in unspecified knee: Secondary | ICD-10-CM | POA: Diagnosis not present

## 2013-01-06 DIAGNOSIS — T07XXXA Unspecified multiple injuries, initial encounter: Secondary | ICD-10-CM

## 2013-01-06 DIAGNOSIS — Z8659 Personal history of other mental and behavioral disorders: Secondary | ICD-10-CM | POA: Insufficient documentation

## 2013-01-06 DIAGNOSIS — Z7902 Long term (current) use of antithrombotics/antiplatelets: Secondary | ICD-10-CM | POA: Insufficient documentation

## 2013-01-06 DIAGNOSIS — I2510 Atherosclerotic heart disease of native coronary artery without angina pectoris: Secondary | ICD-10-CM | POA: Insufficient documentation

## 2013-01-06 DIAGNOSIS — E785 Hyperlipidemia, unspecified: Secondary | ICD-10-CM | POA: Insufficient documentation

## 2013-01-06 DIAGNOSIS — Y92410 Unspecified street and highway as the place of occurrence of the external cause: Secondary | ICD-10-CM | POA: Insufficient documentation

## 2013-01-06 DIAGNOSIS — S99929A Unspecified injury of unspecified foot, initial encounter: Secondary | ICD-10-CM | POA: Diagnosis not present

## 2013-01-06 DIAGNOSIS — S8990XA Unspecified injury of unspecified lower leg, initial encounter: Secondary | ICD-10-CM | POA: Diagnosis not present

## 2013-01-06 DIAGNOSIS — Z8679 Personal history of other diseases of the circulatory system: Secondary | ICD-10-CM | POA: Insufficient documentation

## 2013-01-06 DIAGNOSIS — Z87448 Personal history of other diseases of urinary system: Secondary | ICD-10-CM | POA: Insufficient documentation

## 2013-01-06 DIAGNOSIS — Z9861 Coronary angioplasty status: Secondary | ICD-10-CM | POA: Insufficient documentation

## 2013-01-06 DIAGNOSIS — Z7982 Long term (current) use of aspirin: Secondary | ICD-10-CM | POA: Insufficient documentation

## 2013-01-06 NOTE — ED Notes (Signed)
Wound care to left arm abrasion
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
## 2013-01-06 NOTE — ED Notes (Signed)
After returning from x-ray pt reports soreness in rt rib area, PA notified

## 2013-01-10 NOTE — ED Provider Notes (Signed)
Medical screening examination/treatment/procedure(s) were performed by non-physician practitioner and as supervising physician I was immediately available for consultation/collaboration.  Raeford Razor, MD 01/10/13 [phone]

## 2013-01-14 DIAGNOSIS — S8010XA Contusion of unspecified lower leg, initial encounter: Secondary | ICD-10-CM | POA: Diagnosis not present

## 2013-01-22 ENCOUNTER — Other Ambulatory Visit: Payer: Self-pay

## 2013-02-02 DIAGNOSIS — H2510 Age-related nuclear cataract, unspecified eye: Secondary | ICD-10-CM | POA: Diagnosis not present

## 2013-02-02 DIAGNOSIS — H52209 Unspecified astigmatism, unspecified eye: Secondary | ICD-10-CM | POA: Diagnosis not present

## 2013-02-04 DIAGNOSIS — I10 Essential (primary) hypertension: Secondary | ICD-10-CM | POA: Diagnosis not present

## 2013-02-04 DIAGNOSIS — K219 Gastro-esophageal reflux disease without esophagitis: Secondary | ICD-10-CM | POA: Diagnosis not present

## 2013-02-04 DIAGNOSIS — R1013 Epigastric pain: Secondary | ICD-10-CM | POA: Diagnosis not present

## 2013-02-15 ENCOUNTER — Telehealth: Payer: Self-pay | Admitting: Cardiology

## 2013-02-15 ENCOUNTER — Other Ambulatory Visit: Payer: Self-pay | Admitting: Gastroenterology

## 2013-02-15 DIAGNOSIS — R1011 Right upper quadrant pain: Secondary | ICD-10-CM

## 2013-02-15 NOTE — Telephone Encounter (Signed)
Will forward to  Dr. Brackbill for review 

## 2013-02-15 NOTE — Telephone Encounter (Signed)
Pt having colonoscopy and needs to know when he needs to off plavix prior to procedure, pls fax clearance att: lacy

## 2013-02-16 NOTE — Telephone Encounter (Signed)
Stop plavix 7 days before colonoscopy. Continue baby ASA.  Cleared for colonoscopy from cardiac standpoint.

## 2013-02-17 ENCOUNTER — Ambulatory Visit
Admission: RE | Admit: 2013-02-17 | Discharge: 2013-02-17 | Disposition: A | Discharge status: Home / Self Care | Payer: Medicare Other | Source: Ambulatory Visit | Attending: Gastroenterology | Admitting: Gastroenterology

## 2013-02-17 DIAGNOSIS — K7689 Other specified diseases of liver: Secondary | ICD-10-CM | POA: Diagnosis not present

## 2013-02-17 DIAGNOSIS — R1011 Right upper quadrant pain: Secondary | ICD-10-CM

## 2013-02-17 NOTE — Telephone Encounter (Signed)
Spoke with Dr Jennye Boroughs office and will fax

## 2013-03-09 DIAGNOSIS — Z7901 Long term (current) use of anticoagulants: Secondary | ICD-10-CM | POA: Diagnosis not present

## 2013-03-09 DIAGNOSIS — Z5181 Encounter for therapeutic drug level monitoring: Secondary | ICD-10-CM | POA: Diagnosis not present

## 2013-03-10 DIAGNOSIS — K6389 Other specified diseases of intestine: Secondary | ICD-10-CM | POA: Diagnosis not present

## 2013-03-10 DIAGNOSIS — K5730 Diverticulosis of large intestine without perforation or abscess without bleeding: Secondary | ICD-10-CM | POA: Diagnosis not present

## 2013-03-10 DIAGNOSIS — R1011 Right upper quadrant pain: Secondary | ICD-10-CM | POA: Diagnosis not present

## 2013-03-10 DIAGNOSIS — R197 Diarrhea, unspecified: Secondary | ICD-10-CM | POA: Diagnosis not present

## 2013-03-13 ENCOUNTER — Encounter: Payer: Self-pay | Admitting: Cardiology

## 2013-03-14 ENCOUNTER — Telehealth: Payer: Self-pay | Admitting: *Deleted

## 2013-03-14 NOTE — Telephone Encounter (Signed)
Since patient does have a stent I would prefer if he does not have to come off of aspirin for that entire period.  It is okay if he comes off Plavix for that entire period.  He should check with Dr. Kinnie Scales if it would be permissible for him to leave off Plavix the whole time but leave off the aspirin for just a day or 2 before each procedure.

## 2013-03-14 NOTE — Telephone Encounter (Signed)
Will forward to  Jared. Patty Sermons for review      From: Jared Clark   Sent: 03/13/2013 1:14 PM   To: Jared Clark   Subject: Non-Urgent Medical Question       Had my colonoscopy and endoscopy on 4/3-all ok except for 3-4 large internal hemorrhoids. Jared Clark can "band" them one at a time,2 weeks apart in his o ffice. But I would need to be off ASA and Plavix the entire time. Removing them will probably help my lower colon problems !! Your feelings please. Jared Clark

## 2013-03-14 NOTE — Telephone Encounter (Signed)
Advised patient. Per patient wife thinks Dr Kinnie Scales said ok to continue Asprin

## 2013-04-21 ENCOUNTER — Encounter: Payer: Self-pay | Admitting: Cardiology

## 2013-04-25 ENCOUNTER — Encounter: Payer: Self-pay | Admitting: Cardiology

## 2013-04-27 ENCOUNTER — Telehealth: Payer: Self-pay | Admitting: *Deleted

## 2013-04-27 NOTE — Telephone Encounter (Signed)
Spoke with Dr Perlie Gold and gave him shingles vaccine and that's all that was in chart

## 2013-05-04 ENCOUNTER — Encounter: Payer: Self-pay | Admitting: Cardiology

## 2013-05-04 DIAGNOSIS — R7301 Impaired fasting glucose: Secondary | ICD-10-CM | POA: Diagnosis not present

## 2013-05-04 DIAGNOSIS — I259 Chronic ischemic heart disease, unspecified: Secondary | ICD-10-CM | POA: Diagnosis not present

## 2013-05-04 DIAGNOSIS — E785 Hyperlipidemia, unspecified: Secondary | ICD-10-CM | POA: Diagnosis not present

## 2013-05-04 DIAGNOSIS — Z125 Encounter for screening for malignant neoplasm of prostate: Secondary | ICD-10-CM | POA: Diagnosis not present

## 2013-05-04 DIAGNOSIS — I10 Essential (primary) hypertension: Secondary | ICD-10-CM | POA: Diagnosis not present

## 2013-05-11 DIAGNOSIS — Z0000 Encounter for general adult medical examination without abnormal findings: Secondary | ICD-10-CM | POA: Diagnosis not present

## 2013-05-11 DIAGNOSIS — F329 Major depressive disorder, single episode, unspecified: Secondary | ICD-10-CM | POA: Diagnosis not present

## 2013-05-11 DIAGNOSIS — R7301 Impaired fasting glucose: Secondary | ICD-10-CM | POA: Diagnosis not present

## 2013-05-11 DIAGNOSIS — I10 Essential (primary) hypertension: Secondary | ICD-10-CM | POA: Diagnosis not present

## 2013-05-11 DIAGNOSIS — Z1331 Encounter for screening for depression: Secondary | ICD-10-CM | POA: Diagnosis not present

## 2013-05-11 DIAGNOSIS — I6529 Occlusion and stenosis of unspecified carotid artery: Secondary | ICD-10-CM | POA: Diagnosis not present

## 2013-05-11 DIAGNOSIS — I259 Chronic ischemic heart disease, unspecified: Secondary | ICD-10-CM | POA: Diagnosis not present

## 2013-05-11 DIAGNOSIS — E669 Obesity, unspecified: Secondary | ICD-10-CM | POA: Diagnosis not present

## 2013-05-11 DIAGNOSIS — E785 Hyperlipidemia, unspecified: Secondary | ICD-10-CM | POA: Diagnosis not present

## 2013-05-12 DIAGNOSIS — Z1212 Encounter for screening for malignant neoplasm of rectum: Secondary | ICD-10-CM | POA: Diagnosis not present

## 2013-05-18 ENCOUNTER — Other Ambulatory Visit: Payer: Medicare Other

## 2013-05-25 ENCOUNTER — Other Ambulatory Visit (INDEPENDENT_AMBULATORY_CARE_PROVIDER_SITE_OTHER): Payer: Medicare Other | Admitting: *Deleted

## 2013-05-25 ENCOUNTER — Encounter: Payer: Self-pay | Admitting: Internal Medicine

## 2013-05-25 DIAGNOSIS — I6529 Occlusion and stenosis of unspecified carotid artery: Secondary | ICD-10-CM

## 2013-06-17 DIAGNOSIS — Z6831 Body mass index (BMI) 31.0-31.9, adult: Secondary | ICD-10-CM | POA: Diagnosis not present

## 2013-06-17 DIAGNOSIS — I10 Essential (primary) hypertension: Secondary | ICD-10-CM | POA: Diagnosis not present

## 2013-06-17 DIAGNOSIS — Z0289 Encounter for other administrative examinations: Secondary | ICD-10-CM | POA: Diagnosis not present

## 2013-07-13 ENCOUNTER — Other Ambulatory Visit: Payer: Self-pay

## 2013-08-09 ENCOUNTER — Encounter: Payer: Self-pay | Admitting: Cardiology

## 2013-08-15 ENCOUNTER — Telehealth: Payer: Self-pay | Admitting: Cardiology

## 2013-08-15 NOTE — Telephone Encounter (Signed)
Left message, ok to see in October or November call back for appointment

## 2013-08-15 NOTE — Telephone Encounter (Signed)
Sent a message through my chart and have not heard from you.

## 2013-09-20 DIAGNOSIS — R197 Diarrhea, unspecified: Secondary | ICD-10-CM | POA: Diagnosis not present

## 2013-10-04 ENCOUNTER — Ambulatory Visit (INDEPENDENT_AMBULATORY_CARE_PROVIDER_SITE_OTHER): Payer: Medicare Other | Admitting: Cardiology

## 2013-10-04 ENCOUNTER — Encounter: Payer: Self-pay | Admitting: Cardiology

## 2013-10-04 VITALS — BP 136/80 | HR 50 | Ht 76.0 in | Wt 256.0 lb

## 2013-10-04 DIAGNOSIS — I119 Hypertensive heart disease without heart failure: Secondary | ICD-10-CM

## 2013-10-04 DIAGNOSIS — E785 Hyperlipidemia, unspecified: Secondary | ICD-10-CM | POA: Diagnosis not present

## 2013-10-04 DIAGNOSIS — I259 Chronic ischemic heart disease, unspecified: Secondary | ICD-10-CM

## 2013-10-04 NOTE — Assessment & Plan Note (Signed)
Patient has not had any recurrent chest pain or angina.  There has been no recent use of sublingual nitroglycerin.

## 2013-10-04 NOTE — Assessment & Plan Note (Signed)
The patient brought in  recent lab work done by his PCP Dr. Clelia Croft
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
## 2013-10-04 NOTE — Patient Instructions (Signed)
Your physician recommends that you continue on your current medications as directed. Please refer to the Current Medication list given to you today.  Your physician wants you to follow-up in: 1 year ov/ekg You will receive a reminder letter in the mail two months in advance. If you don't receive a letter, please call our office to schedule the follow-up appointment.  

## 2013-10-04 NOTE — Assessment & Plan Note (Signed)
Blood pressure has been remaining stable on current therapy.  His weight is up 5 pounds since last visit and he and his wife are scheduled to start back on the medifast diet today.

## 2013-10-13 ENCOUNTER — Other Ambulatory Visit: Payer: Self-pay

## 2013-11-02 ENCOUNTER — Ambulatory Visit: Payer: Medicare Other | Admitting: Cardiology

## 2013-11-28 DIAGNOSIS — N320 Bladder-neck obstruction: Secondary | ICD-10-CM | POA: Diagnosis not present

## 2013-11-28 DIAGNOSIS — N400 Enlarged prostate without lower urinary tract symptoms: Secondary | ICD-10-CM | POA: Diagnosis not present

## 2014-01-18 ENCOUNTER — Telehealth: Payer: Self-pay | Admitting: Cardiology

## 2014-01-18 NOTE — Telephone Encounter (Signed)
New message    Pt is a retired Engineer, drilling and he want to come in and have an ekg only.  He states he has an occasional pain near his breast bone----no other symptoms.  He said he and Dr Mare Ferrari discussed this on his last ov--he is not due to see dr until oct.  He want to see if he has had any ekg changes.  Will it be ok to have an ekg only?

## 2014-01-18 NOTE — Telephone Encounter (Signed)
Spoke with patient and this has been going on for a month or so but happening more frequently. Scheduled ov for 2/18 when  Dr. Mare Ferrari is back in the office. Go to ED or Urgent care if worse

## 2014-01-25 ENCOUNTER — Ambulatory Visit (INDEPENDENT_AMBULATORY_CARE_PROVIDER_SITE_OTHER): Payer: Medicare Other | Admitting: Cardiology

## 2014-01-25 ENCOUNTER — Encounter: Payer: Self-pay | Admitting: Cardiology

## 2014-01-25 VITALS — BP 130/82 | HR 54 | Ht 76.0 in | Wt 264.0 lb

## 2014-01-25 DIAGNOSIS — I119 Hypertensive heart disease without heart failure: Secondary | ICD-10-CM | POA: Diagnosis not present

## 2014-01-25 DIAGNOSIS — R079 Chest pain, unspecified: Secondary | ICD-10-CM

## 2014-01-25 DIAGNOSIS — E785 Hyperlipidemia, unspecified: Secondary | ICD-10-CM | POA: Diagnosis not present

## 2014-01-25 DIAGNOSIS — I259 Chronic ischemic heart disease, unspecified: Secondary | ICD-10-CM | POA: Diagnosis not present

## 2014-01-25 NOTE — Assessment & Plan Note (Signed)
The patient complains of lack of energy.  He readily acknowledges that he has not been careful with his diet or with any type of exercise regimen.  His weight has increased 8 more pounds up to 264 today.  He is now living at [facility] which is a retirement center and he is less active than he was when he owned his own home.

## 2014-01-25 NOTE — Assessment & Plan Note (Signed)
Lipids are followed by his PCP Dr. Brigitte Pulse

## 2014-01-25 NOTE — Patient Instructions (Signed)
Your physician has requested that you have a lexiscan myoview. For further information please visit [email or web address]. Please follow instruction sheet, as given.  Your physician recommends that you continue on your current medications as directed. Please refer to the Current Medication list given to you today.  Your physician wants you to follow-up in: in October  You will receive a reminder letter in the mail two months in advance. If you don't receive a letter, please call our office to schedule the follow-up appointment.

## 2014-01-25 NOTE — Assessment & Plan Note (Signed)
The patient was doing well until about 8 days ago when he noticed some intermittent left-sided chest discomfort.  At times it felt like a muscular soreness.  It would occur during rest.  The patient has not been experiencing any significant exertional discomfort.  He is sedentary except for short walks with a dog.  He feels that the cold weather has brought the discomfort on more quickly.  About 5 days ago he took a sublingual nitroglycerin and was amazed at how the discomfort appeared to melt away quickly.  He also felt that at times during the past week he has noted some left arm radiation of the discomfort down the medial aspect of the left arm to the elbow.  He is concerned.  He notes that his wife has commented that she notices him frequently rubbing and clutching his left chest without being aware that he is doing it.

## 2014-01-25 NOTE — Progress Notes (Signed)
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  He comes in ahead of schedule because of concern over intermittent left sided chest soreness and pain. He has a history of known ischemic heart disease. In September of 2003 he underwent stenting of his first obtuse marginal vessel. He had a repeat cardiac catheterization in March 2010 after an abnormal stress Cardiolite. The cardiac catheterization showed long term patency of the prior stent and nonobstructive atherosclerotic disease elsewhere and he had normal left ventricular function. He had a nuclear stress test on 11/13/10 and showed no ischemia and fair exercise capacity with a hypertensive blood pressure response and his ejection fraction was 58%.  The patient was seen as a work in in September because he has been experiencing some intermittent sided chest discomfort for the past week or 2. The episodes have been occurring at rest rather than with exertion. The patient underwent a treadmill Myoview stress test on 08/18/12.  He had poor exercise tolerance and walked for only 4 minutes. He had a hypertensive response to exercise. The stress test showed no ischemia. There was a possible small scar in the basal inferolateral wall on both the stress and rest images. His overall ejection fraction was normal at 57%.     Current Outpatient Prescriptions  Medication Sig Dispense Refill  . alum & mag hydroxide-simeth (MAALOX/MYLANTA) 200-200-20 MG/5ML suspension Take 15 mLs by mouth as needed. Indigestion.      [name] aspirin 81 MG tablet Take 81 mg by mouth daily.       [name] atorvastatin (LIPITOR) 10 MG tablet Take 10 mg by mouth at bedtime.       . citalopram (CELEXA) 20 MG tablet       . clopidogrel (PLAVIX) 75 MG tablet Take 75 mg by mouth every other day.       . diazepam (VALIUM)  5 MG tablet Take 5 mg by mouth every 12 (twelve) hours as needed. Spasms and arthritis.      . finasteride (PROSCAR) 5 MG tablet Take 5 mg by mouth every other day.       . metoprolol succinate (TOPROL XL) 25 MG 24 hr tablet Take 1 tablet (25 mg total) by mouth daily.  90 tablet  3  . naproxen (NAPROSYN) 250 MG tablet Take 250 mg by mouth 2 (two) times daily as needed. Joint pain.      . nitroGLYCERIN (NITROSTAT) 0.4 MG SL tablet Place 1 tablet (0.4 mg total) under the tongue every 5 (five) minutes as needed for chest pain.  25 tablet  6  . ramipril (ALTACE) 2.5 MG capsule Take 2.5 mg by mouth daily.       . ranitidine (ZANTAC) 150 MG capsule Take 150 mg by mouth at bedtime as needed. Sleep.      . valACYclovir (VALTREX) 1000 MG tablet Take 1,000 mg by mouth as needed. Outbreak.      . zolpidem (AMBIEN) 5 MG tablet Take 5 mg by mouth at bedtime as needed. Sleep.       No current facility-administered medications for this visit.    Allergies  Allergen Reactions  . Methocarbamol Swelling  . Orphenadrine Citrate Swelling    Patient Active Problem List   Diagnosis Date Noted  . Ischemic heart disease 03/19/2011    Priority: High  .  Dyslipidemia 03/19/2011    Priority: Medium  . Benign hypertensive heart disease without heart failure 08/22/2011  . BPH (benign prostatic hyperplasia) 03/19/2011  . Malaise and fatigue 03/19/2011    History  Smoking status  . Never Smoker   Smokeless tobacco  . Never Used    History  Alcohol Use  . Yes    Comment: RARE    Family History  Problem Relation Age of Onset  . Cancer Father     Lung Cancer    Review of Systems: The patient denies any heat or cold intolerance.  No weight gain or weight loss.  The patient denies headaches or blurry vision.  There is no cough or sputum production.  The patient denies dizziness.  There is no hematuria or hematochezia.  The patient denies any muscle aches or arthritis.  The patient denies any rash.  The  patient denies frequent falling or instability.  There is no history of depression or anxiety.  All other systems were reviewed and are negative.   Physical Exam: Filed Vitals:   01/25/14 1020  BP: 130/82  Pulse: 54   the general appearance reveals a well-developed well-nourished gentleman in no distress.  His weight is up 6 pounds since last visit.The head and neck exam reveals pupils equal and reactive.  Extraocular movements are full.  There is no scleral icterus.  The mouth and pharynx are normal.  The neck is supple.  The carotids reveal no bruits.  The jugular venous pressure is normal.  The  thyroid is not enlarged.  There is no lymphadenopathy.  The chest is clear to percussion and auscultation.  There are no rales or rhonchi.  Expansion of the chest is symmetrical.  The precordium is quiet.  The first heart sound is normal.  The second heart sound is physiologically split.  There is no murmur gallop rub or click.  There is no abnormal lift or heave.  The abdomen is soft and nontender.  The bowel sounds are normal.  The liver and spleen are not enlarged.  There are no abdominal masses.  There are no abdominal bruits.  Extremities reveal good pedal pulses.  There is no phlebitis or edema.  There is no cyanosis or clubbing.  Strength is normal and symmetrical in all extremities.  There is no lateralizing weakness.  There are no sensory deficits.  The skin is warm and dry.  There is no rash.  EKG shows sinus bradycardia and is otherwise within normal limits.  Since 10/04/13, no significant change.    Assessment / Plan: The etiology of his left sided chest discomfort is not clear.  He states it is reminiscent of the pain that he had prior to his stent 12 years ago. We will have him return soon for a one day walking Lexiscan Myoview. Continue current medication.  He remains on dual antiplatelet therapy. Recheck at his usual time in October 2015 or sooner when necessary depending on results of  stress test.  Use sublingual nitroglycerin as necessary.

## 2014-02-07 ENCOUNTER — Ambulatory Visit (INDEPENDENT_AMBULATORY_CARE_PROVIDER_SITE_OTHER): Payer: Medicare Other | Admitting: Cardiology

## 2014-02-07 ENCOUNTER — Encounter: Payer: Self-pay | Admitting: Cardiology

## 2014-02-07 ENCOUNTER — Ambulatory Visit (HOSPITAL_COMMUNITY): Payer: Medicare Other | Attending: Cardiology | Admitting: Radiology

## 2014-02-07 ENCOUNTER — Encounter: Payer: Self-pay | Admitting: *Deleted

## 2014-02-07 VITALS — BP 139/77 | Ht 76.0 in | Wt 264.0 lb

## 2014-02-07 VITALS — BP 165/84 | HR 60 | Ht 76.5 in | Wt 264.0 lb

## 2014-02-07 DIAGNOSIS — I779 Disorder of arteries and arterioles, unspecified: Secondary | ICD-10-CM | POA: Diagnosis not present

## 2014-02-07 DIAGNOSIS — Z8249 Family history of ischemic heart disease and other diseases of the circulatory system: Secondary | ICD-10-CM | POA: Insufficient documentation

## 2014-02-07 DIAGNOSIS — R9439 Abnormal result of other cardiovascular function study: Secondary | ICD-10-CM | POA: Diagnosis not present

## 2014-02-07 DIAGNOSIS — E785 Hyperlipidemia, unspecified: Secondary | ICD-10-CM | POA: Diagnosis not present

## 2014-02-07 DIAGNOSIS — Z01810 Encounter for preprocedural cardiovascular examination: Secondary | ICD-10-CM | POA: Diagnosis not present

## 2014-02-07 DIAGNOSIS — R0602 Shortness of breath: Secondary | ICD-10-CM | POA: Diagnosis not present

## 2014-02-07 DIAGNOSIS — I259 Chronic ischemic heart disease, unspecified: Secondary | ICD-10-CM | POA: Diagnosis not present

## 2014-02-07 DIAGNOSIS — I10 Essential (primary) hypertension: Secondary | ICD-10-CM | POA: Diagnosis not present

## 2014-02-07 DIAGNOSIS — I2510 Atherosclerotic heart disease of native coronary artery without angina pectoris: Secondary | ICD-10-CM | POA: Diagnosis not present

## 2014-02-07 DIAGNOSIS — I119 Hypertensive heart disease without heart failure: Secondary | ICD-10-CM

## 2014-02-07 DIAGNOSIS — R0989 Other specified symptoms and signs involving the circulatory and respiratory systems: Secondary | ICD-10-CM | POA: Insufficient documentation

## 2014-02-07 DIAGNOSIS — R079 Chest pain, unspecified: Secondary | ICD-10-CM | POA: Diagnosis not present

## 2014-02-07 DIAGNOSIS — R0609 Other forms of dyspnea: Secondary | ICD-10-CM | POA: Diagnosis not present

## 2014-02-07 HISTORY — DX: Abnormal result of other cardiovascular function study: R94.39

## 2014-02-07 MED ORDER — TECHNETIUM TC 99M SESTAMIBI GENERIC - CARDIOLITE
11.0000 | Freq: Once | INTRAVENOUS | Status: AC | PRN
  Administered 2014-02-07: 11 via INTRAVENOUS

## 2014-02-07 MED ORDER — REGADENOSON 0.4 MG/5ML IV SOLN
0.4000 mg | Freq: Once | INTRAVENOUS | Status: AC
Start: 2014-02-07 — End: 2014-02-07
  Administered 2014-02-07: 0.4 mg via INTRAVENOUS

## 2014-02-07 MED ORDER — TECHNETIUM TC 99M SESTAMIBI GENERIC - CARDIOLITE
33.0000 | Freq: Once | INTRAVENOUS | Status: AC | PRN
  Administered 2014-02-07: 33 via INTRAVENOUS

## 2014-02-07 NOTE — Patient Instructions (Signed)
The current medical regimen is effective;  continue present plan and medications.  Your physician has requested that you have a cardiac catheterization. Cardiac catheterization is used to diagnose and/or treat various heart conditions. Doctors may recommend this procedure for a number of different reasons. The most common reason is to evaluate chest pain. Chest pain can be a symptom of coronary artery disease (CAD), and cardiac catheterization can show whether plaque is narrowing or blocking your heart's arteries. This procedure is also used to evaluate the valves, as well as measure the blood flow and oxygen levels in different parts of your heart. For further information please visit HugeFiesta.[email or web address]. Please follow instruction sheet, as given.  Further follow up with be after your cath.

## 2014-02-07 NOTE — Progress Notes (Signed)
[facility] [place], [place] [address] [phone]    Cardiology Nuclear Med Study  Jared Clark is a 78 y.o. male     MRN : [id number]     DOB: 1935/12/14  Procedure Date: 02/07/2014  Nuclear Med Background Indication for Stress Test:  Evaluation for Ischemia and Stent Patency History:  Patent stent,nonobstructive CAD;'06 Echo: EF=55-65%;'13 MPI: EF=57%,small scar basal inferlat wall; Cardiac Risk Factors: Carotid Disease, Family History - CAD, Hypertension and Lipids  Symptoms:  Chest Pain, DOE and SOB   Nuclear Pre-Procedure Caffeine/Decaff Intake:  None > 12 hrs NPO After: 7:00pm   Lungs:  clear O2 Sat: 97% on room air. IV 0.9% NS with Angio Cath:  22g  IV Site: R Hand x 1, tolerated well IV Started by:  Irven Baltimore, RN  Chest Size (in):  50 Cup Size: n/a  Height: 6\' 4"  (1.93 m)  Weight:  264 lb (119.75 kg)  BMI:  Body mass index is 32.15 kg/(m^2). Tech Comments:  Took Toprol this am    Nuclear Med Study 1 or 2 day study: 1 day  Stress Test Type:  Treadmill/Lexiscan  Reading MD: N/A  Order Authorizing Provider:  Darlin Coco, MD  Resting Radionuclide: Technetium 18m Sestamibi  Resting Radionuclide Dose: 11.0 mCi   Stress Radionuclide:  Technetium 23m Sestamibi  Stress Radionuclide Dose: 33.0 mCi           Stress Protocol Rest HR: 50 Stress HR: 93  Rest BP: 139/77 Stress BP: 178/78  Exercise Time (min): 2:00 METS: 1.6   Predicted Max HR: 145 bpm % Max HR: 64.14 bpm Rate Pressure Product: 16554   Dose of Adenosine (mg):  n/a Dose of Lexiscan: 0.4 mg  Dose of Atropine (mg): n/a Dose of Dobutamine: n/a mcg/kg/min (at max HR)  Stress Test Technologist: Perrin Maltese, EMT-P  Nuclear Technologist:  Charlton Amor, CNMT     Rest Procedure:  Myocardial perfusion imaging was performed at rest 45 minutes following the intravenous administration of Technetium 66m Sestamibi. Rest ECG: NSR with LAFB  Stress  Procedure:  The patient received IV Lexiscan 0.4 mg over 15-seconds with concurrent low level exercise and then Technetium 74m Sestamibi was injected at 30-seconds while the patient continued walking one more minute. This patient felt leg fatigue, a headache, fatigue, and sleepy with the Lexiscan injection. Quantitative spect images were obtained after a 45-minute delay. Patient seen by Dr. Percival Spanish. Stress ECG: No significant change from baseline ECG  QPS Raw Data Images:  Normal; no motion artifact; normal heart/lung ratio. Stress Images:  There is decreased uptake in the mid and apical anterolateral wall. Rest Images:  Normal homogeneous uptake in all areas of the myocardium. Subtraction (SDS):  These findings are consistent with ischemia. Transient Ischemic Dilatation (Normal <1.22):  1.05 Lung/Heart Ratio (Normal <0.45):  0.22  Quantitative Gated Spect Images QGS EDV:  133 ml QGS ESV:  56 ml  Impression Exercise Capacity:  Lexiscan with low level exercise. BP Response:  Normal blood pressure response. Clinical Symptoms:  fatigue ECG Impression:  No significant ST segment change suggestive of ischemia. Comparison with Prior Nuclear Study: No images to compare  Overall Impression:  High risk stress nuclear study with a moderate area of ischemia in the mid to apical anterlateral wall.  LV Ejection Fraction: 58%.  LV Wall Motion:  NL LV Function; NL Wall Motion   Signed: Fransico Him, MD 02/07/2014

## 2014-02-07 NOTE — Progress Notes (Signed)
HPI  The patient presents for evaluation of chest pain. He recently saw Dr. Mare Ferrari and was describing some increased chest discomfort. This had been going on for about 6 weeks. He's been having a discomfort in his left upper chest. It does happen with exertion or walking out in the cold. However, he has also had some discomfort at rest. It would take a sublingual nitroglycerin it is severe and it will go away a few minutes after this. He does think it is increased in intensity somewhat. It might be 5/10 at its peak. He does not describe jaw or arm discomfort. He has not had any associated symptoms such as nausea vomiting or diaphoresis. He has not had any new shortness of breath, PND or orthopnea.  He does have a history of stenting to an obtuse marginal in 2003. His last catheterization in 2010 demonstrated a patent stent. He has had a fixed inferior defect on previous stress perfusion studies. Dr. Mare Ferrari set him up for a stress perfusion study which she had done today. It demonstrates a new anterior wall defect is reversible. This is consistent with possible LAD ischemia. He was added to my schedule today to discuss this.   Allergies  Allergen Reactions  . Methocarbamol Swelling  . Orphenadrine Citrate Swelling    Current Outpatient Prescriptions  Medication Sig Dispense Refill  . alum & mag hydroxide-simeth (MAALOX/MYLANTA) 200-200-20 MG/5ML suspension Take 15 mLs by mouth as needed. Indigestion.      [name] aspirin 81 MG tablet Take 81 mg by mouth daily.       [name] atorvastatin (LIPITOR) 10 MG tablet Take 10 mg by mouth at bedtime.       . citalopram (CELEXA) 20 MG tablet Take 20 mg by mouth daily.       . clopidogrel (PLAVIX) 75 MG tablet Take 75 mg by mouth every other day.       . diazepam (VALIUM) 5 MG tablet Take 5 mg by mouth every 12 (twelve) hours as needed. Spasms and arthritis.      . finasteride (PROSCAR) 5 MG tablet Take 5 mg by mouth every other day.       . metoprolol  succinate (TOPROL XL) 25 MG 24 hr tablet Take 1 tablet (25 mg total) by mouth daily.  90 tablet  3  . naproxen (NAPROSYN) 250 MG tablet Take 250 mg by mouth 2 (two) times daily as needed. Joint pain.      . nitroGLYCERIN (NITROSTAT) 0.4 MG SL tablet Place 1 tablet (0.4 mg total) under the tongue every 5 (five) minutes as needed for chest pain.  25 tablet  6  . ramipril (ALTACE) 2.5 MG capsule Take 2.5 mg by mouth daily.       . ranitidine (ZANTAC) 150 MG capsule Take 150 mg by mouth at bedtime as needed. Sleep.      . valACYclovir (VALTREX) 1000 MG tablet Take 1,000 mg by mouth as needed. Outbreak.      . zolpidem (AMBIEN) 5 MG tablet Take 2.5 mg by mouth at bedtime as needed. Sleep.       No current facility-administered medications for this visit.    Past Medical History  Diagnosis Date  . Sinus bradycardia   . Dyslipidemia     History of dyslipidemia  . Mild depression   . BPH (benign prostatic hypertrophy)     S/P TURP 1995  . Hypertension   . Reflux OCCASIONAL    TAKES PRILOSEC  .  Osteoarthritis     RIGHT ANKLE, BILATERAL WRIST, BACK  . Borderline diabetes     DIET CONTROLLED  . Cataract of right eye   . Coronary artery disease CARDIOLOGIST- DR BRACKBILL    LAST VISIT 6 WKS AGO--  LAST VISIT 09-17-11 NOTE IN EPIC , REQUESTED  STRESS TEST(11-13-10)    Past Surgical History  Procedure Laterality Date  . Lumbar laminectomy  1973    L4-S1  . Shoulder arthroscopy      Bilateral  . Coronary angioplasty  2003- FIRST OBTUSE MARGIANL VESSEL     X1 CYPHER STENT  . Cardiac catheterization  02-06-09    NO INTERVENTION  . Tonsillectomy and adenoidectomy  CHILD  . Cholecystectomy  4481    W/ UMBILICAL HERNIA REPAIR  . Hernia repair  8563    Umbilical  . Inguinal hernia repair  1985  . Transurethral resection of prostate  1995  . Cervical discectomy  1989    C4 - 5  . Pilonidal cyst excision  1961  . Knee arthroscopy  11/05/2011    Procedure: ARTHROSCOPY KNEE;  Surgeon:  Gearlean Alf;  Location: [facility];  Service: Orthopedics;  Laterality: Right;  RIGHT ARTHROSCOPY KNEE WITH DEBRIDEMENT, Chondroplasty  . Carotid stent      ROS:  As stated in the HPI and negative for all other systems.  PHYSICAL EXAM BP 165/84  Pulse 60  Ht 6' 4.5" (1.943 m)  Wt 264 lb (119.75 kg)  BMI 31.72 kg/m2 GENERAL:  Well appearing HEENT:  Pupils equal round and reactive, fundi not visualized, oral mucosa unremarkable NECK:  No jugular venous distention, waveform within normal limits, carotid upstroke brisk and symmetric, no bruits, no thyromegaly LYMPHATICS:  No cervical, inguinal adenopathy LUNGS:  Clear to auscultation bilaterally BACK:  No CVA tenderness CHEST:  Unremarkable HEART:  PMI not displaced or sustained,S1 and S2 within normal limits, no S3, no S4, no clicks, no rubs, no murmurs ABD:  Flat, positive bowel sounds normal in frequency in pitch, no bruits, no rebound, no guarding, no midline pulsatile mass, no hepatomegaly, no splenomegaly EXT:  2 plus pulses throughout, no edema, no cyanosis no clubbing SKIN:  No rashes no nodules NEURO:  Cranial nerves II through XII grossly intact, motor grossly intact throughout PSYCH:  Cognitively intact, oriented to person place and time   ASSESSMENT AND PLAN  ABNORMAL STRESS PERFUSION STUDY:  The patient has symptoms of chest discomfort sometimes at rest. Given this and the perfusion results above cardiac catheterization is indicated. This has been arranged for this Friday with Dr. Martinique.  The patient understands that risks included but are not limited to stroke (1 in 1000), death (1 in 41), kidney failure [usually temporary] (1 in 500), bleeding (1 in 200), allergic reaction [possibly serious] (1 in 200).  The patient understands and agrees to proceed.   Of note since he has not been taking his Plavix every day he will take a loading 150 mg tonight and then start 75 mg daily.  HTN:  His blood pressure is  slightly elevated.  However, I reviewed other recent readings and this is not typical.  No change in meds is suggested at this time.

## 2014-02-08 ENCOUNTER — Other Ambulatory Visit: Payer: Self-pay | Admitting: Cardiology

## 2014-02-08 ENCOUNTER — Encounter (HOSPITAL_COMMUNITY): Payer: Self-pay | Admitting: Pharmacy Technician

## 2014-02-08 DIAGNOSIS — R9439 Abnormal result of other cardiovascular function study: Secondary | ICD-10-CM

## 2014-02-08 DIAGNOSIS — H5210 Myopia, unspecified eye: Secondary | ICD-10-CM | POA: Diagnosis not present

## 2014-02-08 DIAGNOSIS — H25049 Posterior subcapsular polar age-related cataract, unspecified eye: Secondary | ICD-10-CM | POA: Diagnosis not present

## 2014-02-08 DIAGNOSIS — H2510 Age-related nuclear cataract, unspecified eye: Secondary | ICD-10-CM | POA: Diagnosis not present

## 2014-02-08 LAB — CBC
HCT: 46.5 % (ref 39.0–52.0)
HEMOGLOBIN: 15.6 g/dL (ref 13.0–17.0)
MCHC: 33.7 g/dL (ref 30.0–36.0)
MCV: 92.4 fl (ref 78.0–100.0)
PLATELETS: 247 10*3/uL (ref 150.0–400.0)
RBC: 5.03 Mil/uL (ref 4.22–5.81)
RDW: 13.5 % (ref 11.5–14.6)
WBC: 7.3 10*3/uL (ref 4.5–10.5)

## 2014-02-08 LAB — BASIC METABOLIC PANEL
BUN: 14 mg/dL (ref 6–23)
CHLORIDE: 104 meq/L (ref 96–112)
CO2: 27 meq/L (ref 19–32)
Calcium: 9.6 mg/dL (ref 8.4–10.5)
Creatinine, Ser: 0.8 mg/dL (ref 0.4–1.5)
GFR: 94.55 mL/min (ref >60.00)
GLUCOSE: 88 mg/dL (ref 70–99)
POTASSIUM: 4.3 meq/L (ref 3.5–5.1)
SODIUM: 138 meq/L (ref 135–145)

## 2014-02-08 LAB — PROTIME-INR
INR: 1.1 ratio — AB (ref 0.8–1.0)
Prothrombin Time: 11.1 s (ref 10.2–12.4)

## 2014-02-10 ENCOUNTER — Encounter (HOSPITAL_COMMUNITY): Payer: Self-pay | Admitting: General Practice

## 2014-02-10 ENCOUNTER — Ambulatory Visit (HOSPITAL_COMMUNITY)
Admission: RE | Admit: 2014-02-10 | Discharge: 2014-02-11 | Disposition: A | Discharge status: Home / Self Care | Payer: Medicare Other | Source: Ambulatory Visit | Attending: Cardiology | Admitting: Cardiology

## 2014-02-10 ENCOUNTER — Encounter (HOSPITAL_COMMUNITY)
Admission: RE | Disposition: A | Discharge status: Home / Self Care | Payer: Medicare Other | Source: Ambulatory Visit | Attending: Cardiology

## 2014-02-10 ENCOUNTER — Other Ambulatory Visit: Payer: Self-pay

## 2014-02-10 DIAGNOSIS — Z7982 Long term (current) use of aspirin: Secondary | ICD-10-CM | POA: Diagnosis not present

## 2014-02-10 DIAGNOSIS — I498 Other specified cardiac arrhythmias: Secondary | ICD-10-CM | POA: Diagnosis not present

## 2014-02-10 DIAGNOSIS — R9439 Abnormal result of other cardiovascular function study: Secondary | ICD-10-CM

## 2014-02-10 DIAGNOSIS — M159 Polyosteoarthritis, unspecified: Secondary | ICD-10-CM | POA: Diagnosis not present

## 2014-02-10 DIAGNOSIS — I2510 Atherosclerotic heart disease of native coronary artery without angina pectoris: Secondary | ICD-10-CM | POA: Diagnosis not present

## 2014-02-10 DIAGNOSIS — F329 Major depressive disorder, single episode, unspecified: Secondary | ICD-10-CM | POA: Diagnosis not present

## 2014-02-10 DIAGNOSIS — E785 Hyperlipidemia, unspecified: Secondary | ICD-10-CM | POA: Insufficient documentation

## 2014-02-10 DIAGNOSIS — H269 Unspecified cataract: Secondary | ICD-10-CM | POA: Diagnosis not present

## 2014-02-10 DIAGNOSIS — N400 Enlarged prostate without lower urinary tract symptoms: Secondary | ICD-10-CM | POA: Insufficient documentation

## 2014-02-10 DIAGNOSIS — Z7902 Long term (current) use of antithrombotics/antiplatelets: Secondary | ICD-10-CM | POA: Diagnosis not present

## 2014-02-10 DIAGNOSIS — Z9861 Coronary angioplasty status: Secondary | ICD-10-CM | POA: Insufficient documentation

## 2014-02-10 DIAGNOSIS — I200 Unstable angina: Secondary | ICD-10-CM | POA: Diagnosis not present

## 2014-02-10 DIAGNOSIS — I119 Hypertensive heart disease without heart failure: Secondary | ICD-10-CM | POA: Insufficient documentation

## 2014-02-10 DIAGNOSIS — F3289 Other specified depressive episodes: Secondary | ICD-10-CM | POA: Diagnosis not present

## 2014-02-10 DIAGNOSIS — I209 Angina pectoris, unspecified: Secondary | ICD-10-CM

## 2014-02-10 DIAGNOSIS — Z955 Presence of coronary angioplasty implant and graft: Secondary | ICD-10-CM

## 2014-02-10 HISTORY — DX: Gastro-esophageal reflux disease without esophagitis: K21.9

## 2014-02-10 HISTORY — PX: LEFT HEART CATHETERIZATION WITH CORONARY ANGIOGRAM: SHX5451

## 2014-02-10 HISTORY — DX: Angina pectoris, unspecified: I20.9

## 2014-02-10 LAB — GLUCOSE, CAPILLARY
GLUCOSE-CAPILLARY: 135 mg/dL — AB (ref 70–99)
Glucose-Capillary: 106 mg/dL — ABNORMAL HIGH (ref 70–99)
Glucose-Capillary: 107 mg/dL — ABNORMAL HIGH (ref 70–99)
Glucose-Capillary: 86 mg/dL (ref 70–99)

## 2014-02-10 LAB — POCT ACTIVATED CLOTTING TIME: ACTIVATED CLOTTING TIME: 481 s

## 2014-02-10 SURGERY — LEFT HEART CATHETERIZATION WITH CORONARY ANGIOGRAM
Anesthesia: LOCAL

## 2014-02-10 MED ORDER — ASPIRIN 81 MG PO CHEW
81.0000 mg | CHEWABLE_TABLET | ORAL | Status: AC
  Administered 2014-02-10: 81 mg via ORAL
  Filled 2014-02-10: qty 1

## 2014-02-10 MED ORDER — SODIUM CHLORIDE 0.9 % IV SOLN
INTRAVENOUS | Status: DC
  Administered 2014-02-10: 07:00:00 via INTRAVENOUS

## 2014-02-10 MED ORDER — CITALOPRAM HYDROBROMIDE 20 MG PO TABS
20.0000 mg | ORAL_TABLET | Freq: Every day | ORAL | Status: DC
  Administered 2014-02-10: 20 mg via ORAL
  Filled 2014-02-10 (×2): qty 1

## 2014-02-10 MED ORDER — FAMOTIDINE 20 MG PO TABS
20.0000 mg | ORAL_TABLET | Freq: Every day | ORAL | Status: DC
  Filled 2014-02-10 (×2): qty 1

## 2014-02-10 MED ORDER — ATORVASTATIN CALCIUM 40 MG PO TABS
40.0000 mg | ORAL_TABLET | Freq: Once | ORAL | Status: AC
  Filled 2014-02-10: qty 1

## 2014-02-10 MED ORDER — SODIUM CHLORIDE 0.9 % IV SOLN: 250.0000 mL | INTRAVENOUS | Status: DC | PRN

## 2014-02-10 MED ORDER — ASPIRIN 81 MG PO CHEW
81.0000 mg | CHEWABLE_TABLET | Freq: Every day | ORAL | Status: DC
  Filled 2014-02-10: qty 1

## 2014-02-10 MED ORDER — VERAPAMIL HCL 2.5 MG/ML IV SOLN
INTRAVENOUS | Status: AC
  Filled 2014-02-10: qty 2

## 2014-02-10 MED ORDER — NITROGLYCERIN 0.4 MG SL SUBL: 0.4000 mg | SUBLINGUAL_TABLET | SUBLINGUAL | Status: DC | PRN

## 2014-02-10 MED ORDER — NITROGLYCERIN 0.2 MG/ML ON CALL CATH LAB
INTRAVENOUS | Status: AC
  Filled 2014-02-10: qty 1

## 2014-02-10 MED ORDER — HEPARIN (PORCINE) IN NACL 2-0.9 UNIT/ML-% IJ SOLN
INTRAMUSCULAR | Status: AC
  Filled 2014-02-10: qty 1500

## 2014-02-10 MED ORDER — DIAZEPAM 5 MG PO TABS: 5.0000 mg | ORAL_TABLET | Freq: Two times a day (BID) | ORAL | Status: DC | PRN

## 2014-02-10 MED ORDER — FINASTERIDE 5 MG PO TABS
5.0000 mg | ORAL_TABLET | ORAL | Status: DC
  Filled 2014-02-10: qty 1

## 2014-02-10 MED ORDER — CLOPIDOGREL BISULFATE 300 MG PO TABS
ORAL_TABLET | ORAL | Status: AC
  Filled 2014-02-10: qty 1

## 2014-02-10 MED ORDER — LIDOCAINE HCL (PF) 1 % IJ SOLN
INTRAMUSCULAR | Status: AC
  Filled 2014-02-10: qty 30

## 2014-02-10 MED ORDER — ALUM & MAG HYDROXIDE-SIMETH 200-200-20 MG/5ML PO SUSP: 15.0000 mL | Freq: Four times a day (QID) | ORAL | Status: DC | PRN

## 2014-02-10 MED ORDER — ZOLPIDEM TARTRATE 5 MG PO TABS
2.5000 mg | ORAL_TABLET | Freq: Every evening | ORAL | Status: DC | PRN
  Administered 2014-02-10: 2.5 mg via ORAL
  Filled 2014-02-10: qty 1

## 2014-02-10 MED ORDER — ATORVASTATIN CALCIUM 80 MG PO TABS
80.0000 mg | ORAL_TABLET | Freq: Every day | ORAL | Status: DC
  Administered 2014-02-10: 80 mg via ORAL
  Filled 2014-02-10 (×3): qty 1

## 2014-02-10 MED ORDER — METOPROLOL SUCCINATE ER 25 MG PO TB24
25.0000 mg | ORAL_TABLET | Freq: Every day | ORAL | Status: DC
  Filled 2014-02-10: qty 1

## 2014-02-10 MED ORDER — HEPARIN SODIUM (PORCINE) 1000 UNIT/ML IJ SOLN
INTRAMUSCULAR | Status: AC
  Filled 2014-02-10: qty 1

## 2014-02-10 MED ORDER — FENTANYL CITRATE 0.05 MG/ML IJ SOLN
INTRAMUSCULAR | Status: AC
  Filled 2014-02-10: qty 2

## 2014-02-10 MED ORDER — MIDAZOLAM HCL 2 MG/2ML IJ SOLN
INTRAMUSCULAR | Status: AC
  Filled 2014-02-10: qty 2

## 2014-02-10 MED ORDER — SODIUM CHLORIDE 0.9 % IV SOLN: 1.0000 mL/kg/h | INTRAVENOUS | Status: AC

## 2014-02-10 MED ORDER — BIVALIRUDIN 250 MG IV SOLR
INTRAVENOUS | Status: AC
  Filled 2014-02-10: qty 250

## 2014-02-10 MED ORDER — CLOPIDOGREL BISULFATE 75 MG PO TABS
75.0000 mg | ORAL_TABLET | Freq: Every day | ORAL | Status: DC
  Filled 2014-02-10: qty 1

## 2014-02-10 MED ORDER — SODIUM CHLORIDE 0.9 % IJ SOLN: 3.0000 mL | INTRAMUSCULAR | Status: DC | PRN

## 2014-02-10 MED ORDER — SODIUM CHLORIDE 0.9 % IJ SOLN: 3.0000 mL | Freq: Two times a day (BID) | INTRAMUSCULAR | Status: DC

## 2014-02-10 MED ORDER — CLOPIDOGREL BISULFATE 75 MG PO TABS: 75.0000 mg | ORAL_TABLET | ORAL | Status: DC

## 2014-02-10 MED ORDER — RAMIPRIL 2.5 MG PO CAPS
2.5000 mg | ORAL_CAPSULE | Freq: Every day | ORAL | Status: DC
  Filled 2014-02-10: qty 1

## 2014-02-10 NOTE — Progress Notes (Signed)
TR BAND REMOVAL  LOCATION:    right radial  DEFLATED PER PROTOCOL:    yes  TIME BAND OFF / DRESSING APPLIED:    1215   SITE UPON ARRIVAL:    Level 0  SITE AFTER BAND REMOVAL:    Level 0  REVERSE ALLEN'S TEST:     positive  CIRCULATION SENSATION AND MOVEMENT:    Within Normal Limits   yes  COMMENTS:   TOLERATED PROCEDURE WELL

## 2014-02-10 NOTE — Care Management Note (Addendum)
  Page 1 of 1   02/10/2014     12:58:05 PM   CARE MANAGEMENT NOTE 02/10/2014  Patient:  Jared Clark, Jared Clark   Account Number:  [id number]  Date Initiated:  02/10/2014  Documentation initiated by:  Tani Virgo  Subjective/Objective Assessment:   CP     Action/Plan:   CM to follow for disposition needs   Anticipated DC Date:  02/11/2014   Anticipated DC Plan:  HOME/SELF CARE         Choice offered to / List presented to:             Status of service:  In process, will continue to follow Medicare Important Message given?   (If response is "NO", the following Medicare IM given date fields will be blank) Date Medicare IM given:   Date Additional Medicare IM given:    Discharge Disposition:    Per UR Regulation:  Reviewed for med. necessity/level of care/duration of stay  If discussed at [place] of Stay Meetings, dates discussed:    Comments:  Per handoff report: ---02/10/2014 0915 by Louie Bun--- Cathed/ intervention/ OIB documented  Left Heart Cath 02/10/2013 [place] RN, BSN, [place], CCM 02/10/2014

## 2014-02-10 NOTE — H&P (View-Only) (Signed)
  HPI  The patient presents for evaluation of chest pain. He recently saw Dr. Brackbill and was describing some increased chest discomfort. This had been going on for about 6 weeks. He's been having a discomfort in his left upper chest. It does happen with exertion or walking out in the cold. However, he has also had some discomfort at rest. It would take a sublingual nitroglycerin it is severe and it will go away a few minutes after this. He does think it is increased in intensity somewhat. It might be 5/10 at its peak. He does not describe jaw or arm discomfort. He has not had any associated symptoms such as nausea vomiting or diaphoresis. He has not had any new shortness of breath, PND or orthopnea.  He does have a history of stenting to an obtuse marginal in 2003. His last catheterization in 2010 demonstrated a patent stent. He has had a fixed inferior defect on previous stress perfusion studies. Dr. Brackbill set him up for a stress perfusion study which she had done today. It demonstrates a new anterior wall defect is reversible. This is consistent with possible LAD ischemia. He was added to my schedule today to discuss this.   Allergies  Allergen Reactions  . Methocarbamol Swelling  . Orphenadrine Citrate Swelling    Current Outpatient Prescriptions  Medication Sig Dispense Refill  . alum & mag hydroxide-simeth (MAALOX/MYLANTA) 200-200-20 MG/5ML suspension Take 15 mLs by mouth as needed. Indigestion.      . aspirin 81 MG tablet Take 81 mg by mouth daily.       . atorvastatin (LIPITOR) 10 MG tablet Take 10 mg by mouth at bedtime.       . citalopram (CELEXA) 20 MG tablet Take 20 mg by mouth daily.       . clopidogrel (PLAVIX) 75 MG tablet Take 75 mg by mouth every other day.       . diazepam (VALIUM) 5 MG tablet Take 5 mg by mouth every 12 (twelve) hours as needed. Spasms and arthritis.      . finasteride (PROSCAR) 5 MG tablet Take 5 mg by mouth every other day.       . metoprolol  succinate (TOPROL XL) 25 MG 24 hr tablet Take 1 tablet (25 mg total) by mouth daily.  90 tablet  3  . naproxen (NAPROSYN) 250 MG tablet Take 250 mg by mouth 2 (two) times daily as needed. Joint pain.      . nitroGLYCERIN (NITROSTAT) 0.4 MG SL tablet Place 1 tablet (0.4 mg total) under the tongue every 5 (five) minutes as needed for chest pain.  25 tablet  6  . ramipril (ALTACE) 2.5 MG capsule Take 2.5 mg by mouth daily.       . ranitidine (ZANTAC) 150 MG capsule Take 150 mg by mouth at bedtime as needed. Sleep.      . valACYclovir (VALTREX) 1000 MG tablet Take 1,000 mg by mouth as needed. Outbreak.      . zolpidem (AMBIEN) 5 MG tablet Take 2.5 mg by mouth at bedtime as needed. Sleep.       No current facility-administered medications for this visit.    Past Medical History  Diagnosis Date  . Sinus bradycardia   . Dyslipidemia     History of dyslipidemia  . Mild depression   . BPH (benign prostatic hypertrophy)     S/P TURP 1995  . Hypertension   . Reflux OCCASIONAL    TAKES PRILOSEC  .   Osteoarthritis     RIGHT ANKLE, BILATERAL WRIST, BACK  . Borderline diabetes     DIET CONTROLLED  . Cataract of right eye   . Coronary artery disease CARDIOLOGIST- DR BRACKBILL    LAST VISIT 6 WKS AGO--  LAST VISIT 09-17-11 NOTE IN EPIC , REQUESTED  STRESS TEST(11-13-10)    Past Surgical History  Procedure Laterality Date  . Lumbar laminectomy  1973    L4-S1  . Shoulder arthroscopy      Bilateral  . Coronary angioplasty  2003- FIRST OBTUSE MARGIANL VESSEL     X1 CYPHER STENT  . Cardiac catheterization  02-06-09    NO INTERVENTION  . Tonsillectomy and adenoidectomy  CHILD  . Cholecystectomy  4481    W/ UMBILICAL HERNIA REPAIR  . Hernia repair  8563    Umbilical  . Inguinal hernia repair  1985  . Transurethral resection of prostate  1995  . Cervical discectomy  1989    C4 - 5  . Pilonidal cyst excision  1961  . Knee arthroscopy  11/05/2011    Procedure: ARTHROSCOPY KNEE;  Surgeon:  Gearlean Alf;  Location: [facility];  Service: Orthopedics;  Laterality: Right;  RIGHT ARTHROSCOPY KNEE WITH DEBRIDEMENT, Chondroplasty  . Carotid stent      ROS:  As stated in the HPI and negative for all other systems.  PHYSICAL EXAM BP 165/84  Pulse 60  Ht 6' 4.5" (1.943 m)  Wt 264 lb (119.75 kg)  BMI 31.72 kg/m2 GENERAL:  Well appearing HEENT:  Pupils equal round and reactive, fundi not visualized, oral mucosa unremarkable NECK:  No jugular venous distention, waveform within normal limits, carotid upstroke brisk and symmetric, no bruits, no thyromegaly LYMPHATICS:  No cervical, inguinal adenopathy LUNGS:  Clear to auscultation bilaterally BACK:  No CVA tenderness CHEST:  Unremarkable HEART:  PMI not displaced or sustained,S1 and S2 within normal limits, no S3, no S4, no clicks, no rubs, no murmurs ABD:  Flat, positive bowel sounds normal in frequency in pitch, no bruits, no rebound, no guarding, no midline pulsatile mass, no hepatomegaly, no splenomegaly EXT:  2 plus pulses throughout, no edema, no cyanosis no clubbing SKIN:  No rashes no nodules NEURO:  Cranial nerves II through XII grossly intact, motor grossly intact throughout PSYCH:  Cognitively intact, oriented to person place and time   ASSESSMENT AND PLAN  ABNORMAL STRESS PERFUSION STUDY:  The patient has symptoms of chest discomfort sometimes at rest. Given this and the perfusion results above cardiac catheterization is indicated. This has been arranged for this Friday with Dr. Martinique.  The patient understands that risks included but are not limited to stroke (1 in 1000), death (1 in 41), kidney failure [usually temporary] (1 in 500), bleeding (1 in 200), allergic reaction [possibly serious] (1 in 200).  The patient understands and agrees to proceed.   Of note since he has not been taking his Plavix every day he will take a loading 150 mg tonight and then start 75 mg daily.  HTN:  His blood pressure is  slightly elevated.  However, I reviewed other recent readings and this is not typical.  No change in meds is suggested at this time.

## 2014-02-10 NOTE — CV Procedure (Signed)
    Cardiac Catheterization Procedure Note  Name: Jared Clark MRN: [id number] DOB: [birth year]  Procedure: Left Heart Cath, Selective Coronary Angiography, LV angiography, PTCA and stenting of the second diagonal.  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 He presents with progressive angina. Stress Myoview shows a new anterior perfusion defect.   Procedural Details:  The right wrist was prepped, draped, and anesthetized with 1% lidocaine. Using the modified Seldinger technique, a 6 French sheath was introduced into the right radial artery. 3 mg of verapamil was administered through the sheath, weight-based unfractionated heparin was administered intravenously. Standard Judkins catheters were used for selective coronary angiography and left ventriculography. Catheter exchanges were performed over an exchange length guidewire.  PROCEDURAL FINDINGS Hemodynamics: AO 117/54 mean 78 mm Hg LV 123/12 mm Hg   Coronary angiography: Coronary dominance: right  Left mainstem: Normal  Left anterior descending (LAD):  Minimal wall irregularities. The first diagonal begins as a central trunk then immediately bifurcates into 2 moderate sized branches. The more proximal branch is without significant disease. The more apical branch has a 95% proximal stenosis.   Left circumflex (LCx): This is a large vessel with mild irregularities less than 20%. The first OM is widely patent at the prior stent site.   Right coronary artery (RCA): There is an irregular, mildly ulcerated plaque in the proximal RCA. The distal vessel has a focal 50% stenosis.   Left ventriculography: Left ventricular systolic function is normal, LVEF is estimated at 55-65%, there is no significant mitral regurgitation   PCI Note:  Following the diagnostic procedure, the decision was made to proceed with PCI of the diagonal.  Weight-based bivalirudin was given for anticoagulation.  Plavix 300 mg was given orally.  Once a therapeutic ACT was achieved, a 6 [place] XBLAD 3.5  guide catheter was inserted.  A prowater coronary guidewire was used to cross the lesion.  The lesion was predilated with a 2.25 mm balloon.  The lesion was then stented with a 2.5 x 12 mm Promus stent.  The stent was postdilated with a 2.75 mm noncompliant balloon.  Following PCI, there was 0% residual stenosis and TIMI-3 flow. Final angiography confirmed an excellent result. The patient tolerated the procedure well. There were no immediate procedural complications. A TR band was used for radial hemostasis. The patient was transferred to the post catheterization recovery area for further monitoring.  PCI Data: Vessel - Diagonal/Segment - proximal Percent Stenosis (pre)  95% TIMI-flow 3 Stent 2.5 x 12 mm Promus Percent Stenosis (post) 0% TIMI-flow (post) 3  Final Conclusions:   1. Single vessel obstructive CAD involving the first diagonal. The old stent in the OM is patent. 2. Normal LV function. 3. Successful stenting of the first diagonal with DES.   Recommendations:  Continue dual antiplatelet therapy with ASA, Plavix for at least a year. Will increase statin dose. Anticipate DC in am with follow up with Dr. Mare Ferrari.  [name] 02/10/2014, 8:46 AM

## 2014-02-10 NOTE — Interval H&P Note (Signed)
History and Physical Interval Note:  02/10/2014 7:40 AM  Jared Clark  has presented today for surgery, with the diagnosis of cp  The various methods of treatment have been discussed with the patient and family. After consideration of risks, benefits and other options for treatment, the patient has consented to  Procedure(s): LEFT HEART CATHETERIZATION WITH CORONARY ANGIOGRAM (N/A) as a surgical intervention .  The patient's history has been reviewed, patient examined, no change in status, stable for surgery.  I have reviewed the patient's chart and labs.  Questions were answered to the patient's satisfaction.   Cath Lab Visit (complete for each Cath Lab visit)  Clinical Evaluation Leading to the Procedure:   ACS: no  Non-ACS:    Anginal Classification: CCS III  Anti-ischemic medical therapy: Minimal Therapy (1 class of medications)  Non-Invasive Test Results: Intermediate-risk stress test findings: cardiac mortality 1-3%/year  Prior CABG: No previous CABG        Jared Clark [facility] 02/10/2014 7:41 AM

## 2014-02-11 ENCOUNTER — Encounter (HOSPITAL_COMMUNITY): Payer: Self-pay | Admitting: Nurse Practitioner

## 2014-02-11 DIAGNOSIS — N400 Enlarged prostate without lower urinary tract symptoms: Secondary | ICD-10-CM | POA: Diagnosis not present

## 2014-02-11 DIAGNOSIS — I498 Other specified cardiac arrhythmias: Secondary | ICD-10-CM | POA: Diagnosis not present

## 2014-02-11 DIAGNOSIS — I2510 Atherosclerotic heart disease of native coronary artery without angina pectoris: Secondary | ICD-10-CM | POA: Diagnosis not present

## 2014-02-11 DIAGNOSIS — I200 Unstable angina: Secondary | ICD-10-CM | POA: Diagnosis not present

## 2014-02-11 DIAGNOSIS — R9439 Abnormal result of other cardiovascular function study: Secondary | ICD-10-CM

## 2014-02-11 DIAGNOSIS — I209 Angina pectoris, unspecified: Secondary | ICD-10-CM

## 2014-02-11 DIAGNOSIS — E785 Hyperlipidemia, unspecified: Secondary | ICD-10-CM | POA: Diagnosis not present

## 2014-02-11 DIAGNOSIS — I119 Hypertensive heart disease without heart failure: Secondary | ICD-10-CM | POA: Diagnosis not present

## 2014-02-11 LAB — BASIC METABOLIC PANEL
BUN: 13 mg/dL (ref 6–23)
CHLORIDE: 103 meq/L (ref 96–112)
CO2: 23 mEq/L (ref 19–32)
Calcium: 9.1 mg/dL (ref 8.4–10.5)
Creatinine, Ser: 0.83 mg/dL (ref 0.50–1.35)
GFR calc Af Amer: >90 mL/min (ref >90)
GFR calc non Af Amer: 84 mL/min — ABNORMAL LOW (ref >90)
GLUCOSE: 93 mg/dL (ref 70–99)
POTASSIUM: 4.4 meq/L (ref 3.7–5.3)
Sodium: 137 mEq/L (ref 137–147)

## 2014-02-11 LAB — CBC
HEMATOCRIT: 40.9 % (ref 39.0–52.0)
Hemoglobin: 14.4 g/dL (ref 13.0–17.0)
MCH: 31.9 pg (ref 26.0–34.0)
MCHC: 35.2 g/dL (ref 30.0–36.0)
MCV: 90.7 fL (ref 78.0–100.0)
Platelets: 185 10*3/uL (ref 150–400)
RBC: 4.51 MIL/uL (ref 4.22–5.81)
RDW: 13.2 % (ref 11.5–15.5)
WBC: 6.6 10*3/uL (ref 4.0–10.5)

## 2014-02-11 LAB — GLUCOSE, CAPILLARY: Glucose-Capillary: 150 mg/dL — ABNORMAL HIGH (ref 70–99)

## 2014-02-11 MED ORDER — ATORVASTATIN CALCIUM 80 MG PO TABS: 80.0000 mg | ORAL_TABLET | Freq: Every day | ORAL | Status: DC

## 2014-02-11 NOTE — Discharge Instructions (Signed)

## 2014-02-11 NOTE — Discharge Summary (Signed)
Discharge Summary   Patient ID: Jared Clark,  MRN: [id number], DOB/AGE: [birth year] 78 y.o.  Admit date: 02/10/2014 Discharge date: 02/11/2014  Primary Care Provider: Marton Redwood Primary Cardiologist: Vaughan Browner, MD   Discharge Diagnoses  Principal Problem:   Angina pectoris  **Status post catheterization and percutaneous coronary intervention and drug-eluting stent placement to the diagonal branch of the LAD this admission.  Active Problems:   CAD (coronary artery disease)   Dyslipidemia   BPH (benign prostatic hyperplasia)   Benign hypertensive heart disease without heart failure  Allergies Allergies  Allergen Reactions  . Methocarbamol Swelling  . Orphenadrine Citrate Swelling   Procedures  Cardiac Catheterization and Percutaneous Coronary Intervention 3.6.2015  PROCEDURAL FINDINGS Hemodynamics: AO 117/54 mean 78 mm Hg LV 123/12 mm Hg              Coronary angiography: Coronary dominance: right  Left mainstem: Normal Left anterior descending (LAD):  Minimal wall irregularities. The first diagonal begins as a central trunk then immediately bifurcates into 2 moderate sized branches. The more proximal branch is without significant disease. The more apical branch has a 95% proximal stenosis.     **The apical branch of the diagonal was successfully stented using a 2.5 x 12 mm Promus drug-eluting stent.  Left circumflex (LCx): This is a large vessel with mild irregularities less than 20%. The first OM is widely patent at the prior stent site.   Right coronary artery (RCA): There is an irregular, mildly ulcerated plaque in the proximal RCA. The distal vessel has a focal 50% stenosis.   Left ventriculography: Left ventricular systolic function is normal, LVEF is estimated at 55-65%, there is no significant mitral regurgitation  _____________   History of Present Illness  78 year old male with a prior cardiac history status post stenting of the obtuse marginal in  2003. He was recently seen in cardiology clinic secondary to recurrent chest discomfort and underwent stress testing revealing mid to apical anterolateral wall ischemia with normal LV function. He was seen again in clinic on March 3 and arrangements were made for diagnostic cardiac catheterization.  Hospital Course  [facility]10/2014 and underwent diagnostic cardiac catheterization revealing a 95% proximal stenosis in the apical branch of the diagonal. He otherwise had nonobstructive coronary artery disease with a widely patent stent in the first obtuse marginal. LV function was normal. The apical branch of the diagonal was successfully stented using a 2.5 x 12 mm Promus drug-eluting stent. Patient tolerated procedure well and post procedure reported intermittent, fleeting left chest and chest wall discomfort. This morning, he has ambulated with some recurrence of this atypical discomfort but without any objective evidence of ischemia. He's felt to be stable for discharge this morning and has followup arranged for next week.  Discharge Vitals Blood pressure 143/64, pulse 57, temperature 97.7 F (36.5 C), temperature source Oral, resp. rate 18, height 6' 4.5" (1.943 m), weight 265 lb (120.203 kg), SpO2 96.00%.  Filed Weights   02/10/14 0548  Weight: 265 lb (120.203 kg)   Labs  CBC  Recent Labs  02/11/14 0345  WBC 6.6  HGB 14.4  HCT 40.9  MCV 90.7  PLT 400   Basic Metabolic Panel  Recent Labs  02/11/14 0345  NA 137  K 4.4  CL 103  CO2 23  GLUCOSE 93  BUN 13  CREATININE 0.83  CALCIUM 9.1   Disposition  Pt is being discharged home today in good condition.  Follow-up Plans & Appointments  Follow-up Information   Follow up with Darlin Coco, MD On 02/17/2014. (9:00 AM)    Specialty:  Cardiology   Contact information:   [address] [place] [address] [phone]       Follow up with  Marton Redwood, MD. (as scheduled.)    Specialty:  Internal Medicine   Contact information:   [place] [address] [phone]      Discharge Medications    Medication List    STOP taking these medications       naproxen 250 MG tablet  Commonly known as:  NAPROSYN      TAKE these medications       alum & mag hydroxide-simeth 200-200-20 MG/5ML suspension  Commonly known as:  MAALOX/MYLANTA  Take 15 mLs by mouth as needed. Indigestion.     aspirin 81 MG tablet  Take 81 mg by mouth daily.     atorvastatin 80 MG tablet  Commonly known as:  LIPITOR  Take 1 tablet (80 mg total) by mouth at bedtime.     citalopram 20 MG tablet  Commonly known as:  CELEXA  Take 20 mg by mouth daily.     clopidogrel 75 MG tablet  Commonly known as:  PLAVIX  Take 75 mg by mouth every other day.     diazepam 5 MG tablet  Commonly known as:  VALIUM  Take 5 mg by mouth every 12 (twelve) hours as needed. Spasms and arthritis.     finasteride 5 MG tablet  Commonly known as:  PROSCAR  Take 5 mg by mouth every other day.     metoprolol succinate 25 MG 24 hr tablet  Commonly known as:  TOPROL XL  Take 1 tablet (25 mg total) by mouth daily.     nitroGLYCERIN 0.4 MG SL tablet  Commonly known as:  NITROSTAT  Place 1 tablet (0.4 mg total) under the tongue every 5 (five) minutes as needed for chest pain.     PROBIOTIC DAILY PO  Take 1 capsule by mouth at bedtime.     ramipril 2.5 MG capsule  Commonly known as:  ALTACE  Take 2.5 mg by mouth daily.     ranitidine 150 MG capsule  Commonly known as:  ZANTAC  Take 150 mg by mouth at bedtime as needed for heartburn.     valACYclovir 1000 MG tablet  Commonly known as:  VALTREX  Take 1,000 mg by mouth as needed. Outbreak.     vitamin C 500 MG tablet  Commonly known as:  ASCORBIC ACID  Take 500 mg by mouth daily.     zolpidem 5 MG tablet  Commonly known as:  AMBIEN  Take 2.5 mg by mouth at bedtime as needed. Sleep.        Outstanding Labs/Studies  Follow-up lipids and lft's in 6-8 wks (increased statin dose).  Duration of Discharge Encounter   Greater than 30 minutes including physician time.  Signed, Murray Hodgkins NP 02/11/2014, 10:58 AM   Attending Note:   The patient was seen and examined.  Agree with assessment and plan as noted above.  Changes made to the above note as needed.  See note from earlier today   Ramond Dial., MD, [facility] 02/11/2014, 5:51 PM

## 2014-02-11 NOTE — Progress Notes (Signed)
   Patient Name: Jared Clark Date of Encounter: 02/11/2014   Principal Problem:   Angina pectoris Active Problems:   CAD (coronary artery disease)   Dyslipidemia   BPH (benign prostatic hyperplasia)   Benign hypertensive heart disease without heart failure   SUBJECTIVE  S/p PCI/DES to the diag yesterday.  Fleeting c/p overnight.  No recurrent pain this AM.  Ambulating w/o difficulty.  CURRENT MEDS . aspirin  81 mg Oral Daily  . atorvastatin  80 mg Oral q1800  . citalopram  20 mg Oral Daily  . clopidogrel  75 mg Oral Q breakfast  . famotidine  20 mg Oral Daily  . finasteride  5 mg Oral QODAY  . metoprolol succinate  25 mg Oral Daily  . ramipril  2.5 mg Oral Daily    OBJECTIVE  Filed Vitals:   02/10/14 2000 02/10/14 2340 02/11/14 0000 02/11/14 0352  BP:  136/60    Pulse: 53  57   Temp:  97.6 F (36.4 C)  97.5 F (36.4 C)  TempSrc:  Oral  Oral  Resp:      Height:      Weight:      SpO2: 95%  93%     Intake/Output Summary (Last 24 hours) at 02/11/14 0807 Last data filed at 02/10/14 2100  Gross per 24 hour  Intake 1595.66 ml  Output   1125 ml  Net 470.66 ml   Filed Weights   02/10/14 0548  Weight: 265 lb (120.203 kg)    PHYSICAL EXAM  General: Pleasant, NAD. Neuro: Alert and oriented X 3. Moves all extremities spontaneously. Psych: Normal affect. HEENT:  Normal  Neck: Supple without bruits or JVD. Lungs:  Resp regular and unlabored, CTA. Heart: RRR no s3, s4, or murmurs. Abdomen: Soft, non-tender, non-distended, BS + x 4.  Extremities: No clubbing, cyanosis or edema. DP/PT/Radials 2+ and equal bilaterally.  r wrist cath site w/o bleeding/bruit/hematoma.  Accessory Clinical Findings  CBC  Recent Labs  02/11/14 0345  WBC 6.6  HGB 14.4  HCT 40.9  MCV 90.7  PLT 378   Basic Metabolic Panel  Recent Labs  02/11/14 0345  NA 137  K 4.4  CL 103  CO2 23  GLUCOSE 93  BUN 13  CREATININE 0.83  CALCIUM 9.1   TELE  rsr/sb  ECG  Sb, 52,  no acute st/t changes.   Radiology/Studies  No results found.  ASSESSMENT AND PLAN  1.  USA/CAD:  S/p PCI/DES to the Diag yesterday.  Cont ASA, plavix, statin, bb. Ambulate this AM and plan d/c w/ office f/u in 2 wks.  2. HTN:  Stable.  3. HL:  On statin.  Signed, Murray Hodgkins NP  Attending Note:   The patient was seen and examined.  Agree with assessment and plan as noted above.  Changes made to the above note as needed.  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  He still has some vague / atypical CP.  Does not sound like angina. He had a successful PCI yesterday. Right radial cath site is ok  Has ambulated without problems.     We have increased his lipitor to 80. OK to be discharged today.   Thayer Headings, Brooke Bonito., MD, [facility] 02/11/2014, 8:41 AM

## 2014-02-11 NOTE — Progress Notes (Signed)
CARDIAC REHAB PHASE I   PRE:  Rate/Rhythm: Sinus 66  BP:     Sitting: 152/90    SaO2: 96% room air  MODE:  Ambulation: 600 ft   POST:  Rate/Rhythem: Sinus 64  BP:    Sitting: 143/64     SaO2: 96% room air  [phone]  Patient ambulated in the hallway independently. 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  Dr Joneen Caraway described the discomfort as a 3-4 on a 1-10 scale. I notified Danna Hefty NP of the patients complaints as we returned to the patients room. 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  Patient given exercise instructions and information on heart healthy diet.  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   Whitaker, Christa See RN BSN

## 2014-02-13 ENCOUNTER — Encounter: Payer: Self-pay | Admitting: Cardiology

## 2014-02-13 MED FILL — Sodium Chloride IV Soln 0.9%: INTRAVENOUS | Qty: 50 | Status: AC

## 2014-02-17 ENCOUNTER — Ambulatory Visit (INDEPENDENT_AMBULATORY_CARE_PROVIDER_SITE_OTHER): Payer: Medicare Other | Admitting: Cardiology

## 2014-02-17 ENCOUNTER — Encounter: Payer: Self-pay | Admitting: Cardiology

## 2014-02-17 VITALS — BP 116/78 | HR 65 | Ht 76.5 in | Wt 257.0 lb

## 2014-02-17 DIAGNOSIS — E785 Hyperlipidemia, unspecified: Secondary | ICD-10-CM

## 2014-02-17 DIAGNOSIS — I259 Chronic ischemic heart disease, unspecified: Secondary | ICD-10-CM | POA: Diagnosis not present

## 2014-02-17 DIAGNOSIS — I119 Hypertensive heart disease without heart failure: Secondary | ICD-10-CM | POA: Diagnosis not present

## 2014-02-17 DIAGNOSIS — R197 Diarrhea, unspecified: Secondary | ICD-10-CM | POA: Diagnosis not present

## 2014-02-17 NOTE — Assessment & Plan Note (Signed)
The patient has had no recurrent angina since his procedure last week.  He has not yet started cardiac rehabilitation but plans to do so next week

## 2014-02-17 NOTE — Assessment & Plan Note (Signed)
Blood pressure is stable on current therapy. 

## 2014-02-17 NOTE — Assessment & Plan Note (Signed)
So far the patient is tolerating the higher dose Lipitor without myalgias

## 2014-02-17 NOTE — Progress Notes (Signed)
[name] Date of Birth:  12/13/1935 [address] 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  [name] He has a history of known ischemic heart disease. In September of 2003 he underwent stenting of his first obtuse marginal vessel. He had a repeat cardiac catheterization in March 2010 after an abnormal stress Cardiolite. The cardiac catheterization showed long term patency of the prior stent and nonobstructive atherosclerotic disease elsewhere and he had normal left ventricular function. He had a nuclear stress test on 11/13/10 and showed no ischemia and fair exercise capacity with a hypertensive blood pressure response and his ejection fraction was 58%.  We saw him as a work in on 01/25/14 because of recurring chest pain.  We scheduled him for a walking LexiScan Myoview.  This showed significant reversible anterolateral ischemia.  One week ago on 02/10/14 the patient underwent cardiac catheterization with successful stenting of the second diagonal with a drug-eluting stent.  He will need to be on dual antiplatelet therapy for one year at least.  His Lipitor was also increased to 80 mg a day for aggressive lipid-lowering risk modification. The patient was seen as a work in in September because he has been experiencing some intermittent sided chest discomfort for the past week or 2. The episodes have been occurring at rest rather than with exertion. The patient underwent a treadmill Myoview stress test on 08/18/12.  He had poor exercise tolerance and walked for only 4 minutes. He had a hypertensive response to exercise. The stress test showed no ischemia. There was a possible small scar in the basal inferolateral wall on both the stress and rest images. His overall ejection fraction was normal at 57%.     Current Outpatient Prescriptions  Medication Sig Dispense Refill    . alum & mag hydroxide-simeth (MAALOX/MYLANTA) 200-200-20 MG/5ML suspension Take 15 mLs by mouth as needed. Indigestion.      [name] aspirin 81 MG tablet Take 81 mg by mouth daily.       [name] atorvastatin (LIPITOR) 80 MG tablet Take 1 tablet (80 mg total) by mouth at bedtime.  90 tablet  3  . citalopram (CELEXA) 20 MG tablet Take 20 mg by mouth daily.       . clopidogrel (PLAVIX) 75 MG tablet Take 75 mg by mouth every other day.       . diazepam (VALIUM) 5 MG tablet Take 5 mg by mouth every 12 (twelve) hours as needed. Spasms and arthritis.      . finasteride (PROSCAR) 5 MG tablet Take 5 mg by mouth every other day.       . metoprolol succinate (TOPROL XL) 25 MG 24 hr tablet Take 1 tablet (25 mg total) by mouth daily.  90 tablet  3  . nitroGLYCERIN (NITROSTAT) 0.4 MG SL tablet Place 1 tablet (0.4 mg total) under the tongue every 5 (five) minutes as needed for chest pain.  25 tablet  6  . Probiotic Product (PROBIOTIC DAILY PO) Take 1 capsule by mouth at bedtime.      . ramipril (ALTACE) 2.5 MG capsule Take 2.5 mg by mouth daily.       . ranitidine (ZANTAC) 150 MG capsule Take 150 mg by mouth at bedtime as needed for heartburn.       . valACYclovir (VALTREX) 1000 MG tablet Take 1,000 mg by mouth as needed.  Outbreak.      . vitamin C (ASCORBIC ACID) 500 MG tablet Take 500 mg by mouth daily.      [name] zolpidem (AMBIEN) 5 MG tablet Take 2.5 mg by mouth at bedtime as needed. Sleep.       No current facility-administered medications for this visit.    Allergies  Allergen Reactions  . Methocarbamol Swelling  . Orphenadrine Citrate Swelling    Patient Active Problem List   Diagnosis Date Noted  . Ischemic heart disease 03/19/2011    Priority: High  . Dyslipidemia 03/19/2011    Priority: Medium  . CAD (coronary artery disease) 02/11/2014  . Angina pectoris 02/10/2014  . Abnormal stress test 02/07/2014  . Family history of angina, class IV 02/07/2014  . Chest pain at rest 01/25/2014  . Benign  hypertensive heart disease without heart failure 08/22/2011  . BPH (benign prostatic hyperplasia) 03/19/2011  . Malaise and fatigue 03/19/2011    History  Smoking status  . Never Smoker   Smokeless tobacco  . Never Used    History  Alcohol Use  . Yes    Comment: RARE    Family History  Problem Relation Age of Onset  . Cancer Father     Lung Cancer    Review of Systems: The patient denies any heat or cold intolerance.  No weight gain or weight loss.  The patient denies headaches or blurry vision.  There is no cough or sputum production.  The patient denies dizziness.  There is no hematuria or hematochezia.  The patient denies any muscle aches or arthritis.  The patient denies any rash.  The patient denies frequent falling or instability.  There is no history of depression or anxiety.  All other systems were reviewed and are negative.   Physical Exam: Filed Vitals:   02/17/14 0915  BP: 116/78  Pulse: 65   the general appearance reveals a well-developed well-nourished gentleman in no distress.  His weight is up 6 pounds since last visit.The head and neck exam reveals pupils equal and reactive.  Extraocular movements are full.  There is no scleral icterus.  The mouth and pharynx are normal.  The neck is supple.  The carotids reveal no bruits.  The jugular venous pressure is normal.  The  thyroid is not enlarged.  There is no lymphadenopathy.  The chest is clear to percussion and auscultation.  There are no rales or rhonchi.  Expansion of the chest is symmetrical.  The precordium is quiet.  The first heart sound is normal.  The second heart sound is physiologically split.  There is no murmur gallop rub or click.  There is no abnormal lift or heave.  The abdomen is soft and nontender.  The bowel sounds are normal.  The liver and spleen are not enlarged.  There are no abdominal masses.  There are no abdominal bruits.  Extremities reveal good pedal pulses.  There is no phlebitis or edema.   There is no cyanosis or clubbing.  Strength is normal and symmetrical in all extremities.  There is no lateralizing weakness.  There are no sensory deficits.  The skin is warm and dry.  There is no rash.      Assessment / Plan: Continue current medication.  Start cardiac rehabilitation.  Recheck in 3 months for office visit EKG and fasting lipid panel hepatic function panel and basal metabolic panel

## 2014-02-17 NOTE — Patient Instructions (Signed)
Your physician recommends that you continue on your current medications as directed. Please refer to the Current Medication list given to you today.  Your physician recommends that you schedule a follow-up appointment in: 3 months with fasting labs (LP/BMET/HFP) and ekg 

## 2014-03-09 ENCOUNTER — Encounter (HOSPITAL_COMMUNITY)
Admission: RE | Admit: 2014-03-09 | Discharge: 2014-03-09 | Disposition: A | Discharge status: Home / Self Care | Payer: Medicare Other | Source: Ambulatory Visit | Attending: Cardiology | Admitting: Cardiology

## 2014-03-09 DIAGNOSIS — Z9861 Coronary angioplasty status: Secondary | ICD-10-CM | POA: Insufficient documentation

## 2014-03-09 DIAGNOSIS — I2510 Atherosclerotic heart disease of native coronary artery without angina pectoris: Secondary | ICD-10-CM | POA: Insufficient documentation

## 2014-03-09 DIAGNOSIS — Z5189 Encounter for other specified aftercare: Secondary | ICD-10-CM | POA: Insufficient documentation

## 2014-03-09 DIAGNOSIS — I259 Chronic ischemic heart disease, unspecified: Secondary | ICD-10-CM | POA: Insufficient documentation

## 2014-03-09 NOTE — Progress Notes (Signed)
Cardiac Rehab Medication Review by a Pharmacist  Does the patient  feel that his/her medications are working for him/her?  yes  Has the patient been experiencing any side effects to the medications prescribed?  no  Does the patient measure his/her own blood pressure or blood glucose at home?  yes   Does the patient have any problems obtaining medications due to transportation or finances?   no  Understanding of regimen: good Understanding of indications: fair Potential of compliance: good    Pharmacist comments:  Jared Clark is a pleasant 78 yo M presenting with a list of his medications. He expressed concern about his increasing fatigue and wondered if Lipitor may be the cause. I explained that this is likely not the cause and that the major side effect would be myalgias. He stated that he rarely measures his blood pressure at home but when he does it is normal. He also states that his pulse is usually right around 60 bpm. Overall, he seemed to be more understanding of his medication regimen at the end of our time together and agreed to speak to his PCP about his fatigue at his next appointment.    Von Nils Flack 03/09/2014 8:36 AM

## 2014-03-13 ENCOUNTER — Encounter (HOSPITAL_COMMUNITY): Payer: Self-pay

## 2014-03-13 ENCOUNTER — Encounter (HOSPITAL_COMMUNITY)
Admission: RE | Admit: 2014-03-13 | Discharge: 2014-03-13 | Disposition: A | Discharge status: Home / Self Care | Payer: Medicare Other | Source: Ambulatory Visit | Attending: Cardiology | Admitting: Cardiology

## 2014-03-13 DIAGNOSIS — Z5189 Encounter for other specified aftercare: Secondary | ICD-10-CM | POA: Diagnosis not present

## 2014-03-13 DIAGNOSIS — I2510 Atherosclerotic heart disease of native coronary artery without angina pectoris: Secondary | ICD-10-CM | POA: Diagnosis not present

## 2014-03-13 DIAGNOSIS — Z9861 Coronary angioplasty status: Secondary | ICD-10-CM | POA: Diagnosis not present

## 2014-03-13 DIAGNOSIS — I259 Chronic ischemic heart disease, unspecified: Secondary | ICD-10-CM | POA: Diagnosis not present

## 2014-03-13 NOTE — Progress Notes (Signed)
Pt started cardiac rehab today.  Pt tolerated light exercise without difficulty.  VSS, telemetry-NSR. Asymptomatic.  PHQ-1 although pt admits to symptoms of depression at least once weekly. Pt states he feels this is mostly related to boredom.  Will continue to monitor. Pt offered emotional support and reassurance.  Pt has no barriers to rehab participation and demonstrates good coping skills with supportive family.  Pt oriented to exercise equipment and routine.  Understanding verbalized.

## 2014-03-15 ENCOUNTER — Encounter (HOSPITAL_COMMUNITY)
Admission: RE | Admit: 2014-03-15 | Discharge: 2014-03-15 | Disposition: A | Discharge status: Home / Self Care | Payer: Medicare Other | Source: Ambulatory Visit | Attending: Cardiology | Admitting: Cardiology

## 2014-03-15 DIAGNOSIS — Z5189 Encounter for other specified aftercare: Secondary | ICD-10-CM | POA: Diagnosis not present

## 2014-03-17 ENCOUNTER — Encounter (HOSPITAL_COMMUNITY)
Admission: RE | Admit: 2014-03-17 | Discharge: 2014-03-17 | Disposition: A | Discharge status: Home / Self Care | Payer: Medicare Other | Source: Ambulatory Visit | Attending: Cardiology | Admitting: Cardiology

## 2014-03-17 DIAGNOSIS — Z5189 Encounter for other specified aftercare: Secondary | ICD-10-CM | POA: Diagnosis not present

## 2014-03-17 NOTE — Progress Notes (Signed)
PSYCHOSOCIAL ASSESSMENT  Pt psychosocial assessment reveals no barriers to rehab participation.  Pt quality of life is slightly altered by his functional decline r/t his recent cardiac event.  Pt denies recent anginal pain, however admits chest wall tenderness at times.  Pt is emotionally distressed and worried about his current living situation and would like to consider alternative options for the future. This is a difficult transition for he and his wife.  Overall , pt denies severe symptoms of depression, PHQ-1.  However pt reports symptoms of lack of stimulation and boredom.  Pt takes celexa daily which is effective for him at this time.    Pt exhibits positive coping skills and has supportive family.  Offered emotional support and reassurance.  At patient request written information about advanced directives given to patient.  Will continue to monitor.

## 2014-03-20 ENCOUNTER — Encounter (HOSPITAL_COMMUNITY)
Admission: RE | Admit: 2014-03-20 | Discharge: 2014-03-20 | Disposition: A | Discharge status: Home / Self Care | Payer: Medicare Other | Source: Ambulatory Visit | Attending: Cardiology | Admitting: Cardiology

## 2014-03-20 DIAGNOSIS — Z5189 Encounter for other specified aftercare: Secondary | ICD-10-CM | POA: Diagnosis not present

## 2014-03-20 NOTE — Progress Notes (Signed)
Jared Clark 78 y.o. male Nutrition Note Spoke with pt.  Nutrition Survey reviewed with pt. Pt has some room for improvement. Pt is following Step 1 of the Therapeutic Lifestyle Changes diet. Pt wants to lose wt. Pt has been trying to lose wt by decreasing his amount of desserts and sweets intake and increasing his fruit and vegetable intake. Wt loss tips reviewed. Pt reports he lives in a retirement community and most of his foods are prepared for him. Pt reports the meals served are mostly healthy with little to no fried foods. Pt reports he has been trying to go to the salad bar more and try not to eat the ice cream sundaes offered. Pt was educated on dessert alternatives that are healthier. Pt expressed understanding of the information reviewed. Pt aware of nutrition education classes offered and plans on attending nutrition classes.  Nutrition Diagnosis   Food-and nutrition-related knowledge deficit related to lack of exposure to information as related to diagnosis of: ? CVD    Obesity related to excessive energy intake as evidenced by a BMI of 32.3  Nutrition Intervention   Benefits of adopting Therapeutic Lifestyle Changes discussed when Medficts reviewed.   Pt to attend the Portion Distortion class   Pt to attend the  ? Nutrition I class                     ? Nutrition II class   Continue client-centered nutrition education by RD, as part of interdisciplinary care.  Goal(s)   Pt to describe the potential benefits of adopting Therapeutic Lifestyle Changes   Pt to identify food quantities necessary to achieve: ? wt loss to a goal wt of 242-260 lb (110-118 kg) at graduation from cardiac rehab.    Pt to describe the benefit of including fruits, vegetables, whole grains, and low-fat dairy products in a heart healthy meal plan.  Monitor and Evaluate progress toward nutrition goal with team. Nutrition Risk:  [place] Dietetic Intern  03/20/2014 10:03 AM

## 2014-03-22 ENCOUNTER — Encounter (HOSPITAL_COMMUNITY)
Admission: RE | Admit: 2014-03-22 | Discharge: 2014-03-22 | Disposition: A | Discharge status: Home / Self Care | Payer: Medicare Other | Source: Ambulatory Visit | Attending: Cardiology | Admitting: Cardiology

## 2014-03-22 DIAGNOSIS — Z5189 Encounter for other specified aftercare: Secondary | ICD-10-CM | POA: Diagnosis not present

## 2014-03-24 ENCOUNTER — Encounter (HOSPITAL_COMMUNITY)
Admission: RE | Admit: 2014-03-24 | Discharge: 2014-03-24 | Disposition: A | Discharge status: Home / Self Care | Payer: Medicare Other | Source: Ambulatory Visit | Attending: Cardiology | Admitting: Cardiology

## 2014-03-24 DIAGNOSIS — Z5189 Encounter for other specified aftercare: Secondary | ICD-10-CM | POA: Diagnosis not present

## 2014-03-24 NOTE — Progress Notes (Signed)
I have reviewed home exercise with Cornelia Copa. The patient was advised to walk 2-4 days per week outside of CRP II for 10 minutes, 3 times per day, progressing to 15 minutes, 2 times per day until he can walk 30 minutes continuously.  Pt will also complete one additional day of hand weights outside of CRP II.  Progression of exercise prescription was discussed.  Reviewed THR, pulse, RPE, sign and symptoms, NTG use and when to call 911 or MD.  Pt voiced understanding.  Pt stated having the motivation for exercise is difficult. 0815  Archie Endo, MS, ACSM RCEP 03/24/2014 2:50 PM

## 2014-03-27 ENCOUNTER — Encounter (HOSPITAL_COMMUNITY)
Admission: RE | Admit: 2014-03-27 | Discharge: 2014-03-27 | Disposition: A | Discharge status: Home / Self Care | Payer: Medicare Other | Source: Ambulatory Visit | Attending: Cardiology | Admitting: Cardiology

## 2014-03-27 DIAGNOSIS — Z5189 Encounter for other specified aftercare: Secondary | ICD-10-CM | POA: Diagnosis not present

## 2014-03-29 ENCOUNTER — Encounter (HOSPITAL_COMMUNITY)
Admission: RE | Admit: 2014-03-29 | Discharge: 2014-03-29 | Disposition: A | Discharge status: Home / Self Care | Payer: Medicare Other | Source: Ambulatory Visit | Attending: Cardiology | Admitting: Cardiology

## 2014-03-29 DIAGNOSIS — Z5189 Encounter for other specified aftercare: Secondary | ICD-10-CM | POA: Diagnosis not present

## 2014-03-31 ENCOUNTER — Encounter (HOSPITAL_COMMUNITY)
Admission: RE | Admit: 2014-03-31 | Discharge: 2014-03-31 | Disposition: A | Discharge status: Home / Self Care | Payer: Medicare Other | Source: Ambulatory Visit | Attending: Cardiology | Admitting: Cardiology

## 2014-03-31 DIAGNOSIS — Z5189 Encounter for other specified aftercare: Secondary | ICD-10-CM | POA: Diagnosis not present

## 2014-04-03 ENCOUNTER — Encounter (HOSPITAL_COMMUNITY)
Admission: RE | Admit: 2014-04-03 | Discharge: 2014-04-03 | Disposition: A | Discharge status: Home / Self Care | Payer: Medicare Other | Source: Ambulatory Visit | Attending: Cardiology | Admitting: Cardiology

## 2014-04-03 DIAGNOSIS — Z5189 Encounter for other specified aftercare: Secondary | ICD-10-CM | POA: Diagnosis not present

## 2014-04-05 ENCOUNTER — Encounter (HOSPITAL_COMMUNITY)
Admission: RE | Admit: 2014-04-05 | Discharge: 2014-04-05 | Disposition: A | Discharge status: Home / Self Care | Payer: Medicare Other | Source: Ambulatory Visit | Attending: Cardiology | Admitting: Cardiology

## 2014-04-05 DIAGNOSIS — Z5189 Encounter for other specified aftercare: Secondary | ICD-10-CM | POA: Diagnosis not present

## 2014-04-07 ENCOUNTER — Encounter (HOSPITAL_COMMUNITY)
Admission: RE | Admit: 2014-04-07 | Discharge: 2014-04-07 | Disposition: A | Discharge status: Home / Self Care | Payer: Medicare Other | Source: Ambulatory Visit | Attending: Cardiology | Admitting: Cardiology

## 2014-04-07 DIAGNOSIS — I259 Chronic ischemic heart disease, unspecified: Secondary | ICD-10-CM | POA: Diagnosis not present

## 2014-04-07 DIAGNOSIS — Z5189 Encounter for other specified aftercare: Secondary | ICD-10-CM | POA: Diagnosis not present

## 2014-04-07 DIAGNOSIS — Z9861 Coronary angioplasty status: Secondary | ICD-10-CM | POA: Diagnosis not present

## 2014-04-07 DIAGNOSIS — I119 Hypertensive heart disease without heart failure: Secondary | ICD-10-CM | POA: Diagnosis not present

## 2014-04-10 ENCOUNTER — Encounter (HOSPITAL_COMMUNITY)
Admission: RE | Admit: 2014-04-10 | Discharge: 2014-04-10 | Disposition: A | Discharge status: Home / Self Care | Payer: Medicare Other | Source: Ambulatory Visit | Attending: Cardiology | Admitting: Cardiology

## 2014-04-10 NOTE — Progress Notes (Signed)
Pt had 2.29-2.42 second sinus pauses during cool down at cardiac rehab today.  Pt asymptomatic, VSS.  Otherwise telemetry normal sinus rhythm.  Pt reports feeling palpitations at rest over the weekend which is new for him. Strips faxed to Dr. Mare Ferrari for review.  Left message for Dr. Bobbye Riggs nurse

## 2014-04-12 ENCOUNTER — Encounter (HOSPITAL_COMMUNITY)
Admission: RE | Admit: 2014-04-12 | Discharge: 2014-04-12 | Disposition: A | Discharge status: Home / Self Care | Payer: Medicare Other | Source: Ambulatory Visit | Attending: Cardiology | Admitting: Cardiology

## 2014-04-12 ENCOUNTER — Telehealth: Payer: Self-pay | Admitting: Cardiology

## 2014-04-13 NOTE — Telephone Encounter (Signed)
Received a fax from cardiac rehab patient had 3 episodes of greater than 2 seconds pauses during recovery on 04/10/14 and 1 2.45 second pause on 04/12/14. Reviewed by  Dr. Mare Ferrari and will decrease Toprol XL to 12.5 mg daily. Advised patient

## 2014-04-14 ENCOUNTER — Encounter (HOSPITAL_COMMUNITY)
Admission: RE | Admit: 2014-04-14 | Discharge: 2014-04-14 | Disposition: A | Discharge status: Home / Self Care | Payer: Medicare Other | Source: Ambulatory Visit | Attending: Cardiology | Admitting: Cardiology

## 2014-04-14 NOTE — Progress Notes (Signed)
Pt arrived at cardiac rehab today reporting medication change. Per Dr. Mare Ferrari, pt has decreased metoprolol to 12.5mg  daily.  Med list reconciled. No sinus pauses noted on monitor today at cardiac rehab.

## 2014-04-17 ENCOUNTER — Other Ambulatory Visit: Payer: Self-pay | Admitting: *Deleted

## 2014-04-17 ENCOUNTER — Encounter: Payer: Self-pay | Admitting: Cardiology

## 2014-04-17 ENCOUNTER — Encounter (HOSPITAL_COMMUNITY)
Admission: RE | Admit: 2014-04-17 | Discharge: 2014-04-17 | Disposition: A | Discharge status: Home / Self Care | Payer: Medicare Other | Source: Ambulatory Visit | Attending: Cardiology | Admitting: Cardiology

## 2014-04-17 MED ORDER — ATORVASTATIN CALCIUM 80 MG PO TABS: 80.0000 mg | ORAL_TABLET | Freq: Every day | ORAL | Status: DC

## 2014-04-19 ENCOUNTER — Telehealth: Payer: Self-pay | Admitting: Cardiology

## 2014-04-19 ENCOUNTER — Encounter (HOSPITAL_COMMUNITY)
Admission: RE | Admit: 2014-04-19 | Discharge: 2014-04-19 | Disposition: A | Discharge status: Home / Self Care | Payer: Medicare Other | Source: Ambulatory Visit | Attending: Cardiology | Admitting: Cardiology

## 2014-04-19 NOTE — Telephone Encounter (Signed)
Discussed labs he needs from PCP

## 2014-04-19 NOTE — Telephone Encounter (Signed)
New problem   Pt stated he sent an email to nurse and hasn't received a call back concerning his labs. Please call pt.

## 2014-04-21 ENCOUNTER — Encounter (HOSPITAL_COMMUNITY)
Admission: RE | Admit: 2014-04-21 | Discharge: 2014-04-21 | Disposition: A | Discharge status: Home / Self Care | Payer: Medicare Other | Source: Ambulatory Visit | Attending: Cardiology | Admitting: Cardiology

## 2014-04-24 ENCOUNTER — Encounter (HOSPITAL_COMMUNITY)
Admission: RE | Admit: 2014-04-24 | Discharge: 2014-04-24 | Disposition: A | Discharge status: Home / Self Care | Payer: Medicare Other | Source: Ambulatory Visit | Attending: Cardiology | Admitting: Cardiology

## 2014-04-26 ENCOUNTER — Encounter (HOSPITAL_COMMUNITY)
Admission: RE | Admit: 2014-04-26 | Discharge: 2014-04-26 | Disposition: A | Discharge status: Home / Self Care | Payer: Medicare Other | Source: Ambulatory Visit | Attending: Cardiology | Admitting: Cardiology

## 2014-04-28 ENCOUNTER — Encounter (HOSPITAL_COMMUNITY): Payer: Medicare Other

## 2014-05-01 ENCOUNTER — Encounter (HOSPITAL_COMMUNITY): Payer: Medicare Other

## 2014-05-01 ENCOUNTER — Encounter: Payer: Self-pay | Admitting: Cardiology

## 2014-05-02 ENCOUNTER — Other Ambulatory Visit: Payer: Self-pay | Admitting: *Deleted

## 2014-05-02 MED ORDER — ATORVASTATIN CALCIUM 80 MG PO TABS: 80.0000 mg | ORAL_TABLET | Freq: Every day | ORAL | Status: DC

## 2014-05-02 NOTE — Telephone Encounter (Signed)
Refilled Atorvastatin as requested, patient states still taking 80 mg daily

## 2014-05-03 ENCOUNTER — Encounter (HOSPITAL_COMMUNITY)
Admission: RE | Admit: 2014-05-03 | Discharge: 2014-05-03 | Disposition: A | Discharge status: Home / Self Care | Payer: Medicare Other | Source: Ambulatory Visit | Attending: Cardiology | Admitting: Cardiology

## 2014-05-05 ENCOUNTER — Encounter (HOSPITAL_COMMUNITY)
Admission: RE | Admit: 2014-05-05 | Discharge: 2014-05-05 | Disposition: A | Discharge status: Home / Self Care | Payer: Medicare Other | Source: Ambulatory Visit | Attending: Cardiology | Admitting: Cardiology

## 2014-05-08 ENCOUNTER — Encounter: Payer: Self-pay | Admitting: Cardiology

## 2014-05-08 ENCOUNTER — Encounter (HOSPITAL_COMMUNITY)
Admission: RE | Admit: 2014-05-08 | Discharge: 2014-05-08 | Disposition: A | Discharge status: Home / Self Care | Payer: Medicare Other | Source: Ambulatory Visit | Attending: Cardiology | Admitting: Cardiology

## 2014-05-08 DIAGNOSIS — Z9861 Coronary angioplasty status: Secondary | ICD-10-CM | POA: Insufficient documentation

## 2014-05-08 DIAGNOSIS — Z5189 Encounter for other specified aftercare: Secondary | ICD-10-CM | POA: Insufficient documentation

## 2014-05-08 DIAGNOSIS — I119 Hypertensive heart disease without heart failure: Secondary | ICD-10-CM | POA: Diagnosis not present

## 2014-05-08 DIAGNOSIS — I259 Chronic ischemic heart disease, unspecified: Secondary | ICD-10-CM | POA: Diagnosis not present

## 2014-05-10 ENCOUNTER — Other Ambulatory Visit: Payer: Medicare Other

## 2014-05-10 ENCOUNTER — Encounter (HOSPITAL_COMMUNITY)
Admission: RE | Admit: 2014-05-10 | Discharge: 2014-05-10 | Disposition: A | Discharge status: Home / Self Care | Payer: Medicare Other | Source: Ambulatory Visit | Attending: Cardiology | Admitting: Cardiology

## 2014-05-12 ENCOUNTER — Encounter (HOSPITAL_COMMUNITY)
Admission: RE | Admit: 2014-05-12 | Discharge: 2014-05-12 | Disposition: A | Discharge status: Home / Self Care | Payer: Medicare Other | Source: Ambulatory Visit | Attending: Cardiology | Admitting: Cardiology

## 2014-05-15 ENCOUNTER — Encounter (HOSPITAL_COMMUNITY)
Admission: RE | Admit: 2014-05-15 | Discharge: 2014-05-15 | Disposition: A | Discharge status: Home / Self Care | Payer: Medicare Other | Source: Ambulatory Visit | Attending: Cardiology | Admitting: Cardiology

## 2014-05-17 ENCOUNTER — Encounter: Payer: Self-pay | Admitting: Cardiology

## 2014-05-17 ENCOUNTER — Encounter (HOSPITAL_COMMUNITY)
Admission: RE | Admit: 2014-05-17 | Discharge: 2014-05-17 | Disposition: A | Discharge status: Home / Self Care | Payer: Medicare Other | Source: Ambulatory Visit | Attending: Cardiology | Admitting: Cardiology

## 2014-05-17 ENCOUNTER — Ambulatory Visit (INDEPENDENT_AMBULATORY_CARE_PROVIDER_SITE_OTHER): Payer: Medicare Other | Admitting: Cardiology

## 2014-05-17 VITALS — BP 123/76 | HR 68 | Ht 76.25 in | Wt 258.0 lb

## 2014-05-17 DIAGNOSIS — R05 Cough: Secondary | ICD-10-CM | POA: Diagnosis not present

## 2014-05-17 DIAGNOSIS — I209 Angina pectoris, unspecified: Secondary | ICD-10-CM

## 2014-05-17 DIAGNOSIS — I259 Chronic ischemic heart disease, unspecified: Secondary | ICD-10-CM

## 2014-05-17 DIAGNOSIS — I119 Hypertensive heart disease without heart failure: Secondary | ICD-10-CM

## 2014-05-17 DIAGNOSIS — R059 Cough, unspecified: Secondary | ICD-10-CM | POA: Diagnosis not present

## 2014-05-17 DIAGNOSIS — E785 Hyperlipidemia, unspecified: Secondary | ICD-10-CM

## 2014-05-17 MED ORDER — LOSARTAN POTASSIUM 25 MG PO TABS
25.0000 mg | ORAL_TABLET | Freq: Every day | ORAL | Status: DC
Start: 2014-05-17 — End: 2015-02-27

## 2014-05-17 NOTE — Assessment & Plan Note (Signed)
The patient has not been expressing recurrent chest pain or angina since his angioplasty and stenting done on 02/10/14 to his second diagonal with a drug-eluting stent.

## 2014-05-17 NOTE — Assessment & Plan Note (Signed)
The patient has a history of dyslipidemia.  He will be getting blood work with his PCP in preparation for his annual physical later this month.

## 2014-05-17 NOTE — Progress Notes (Signed)
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 Jared Clark Kitchen He has a history of known ischemic heart disease. In September of 2003 he underwent stenting of his first obtuse marginal vessel. He had a repeat cardiac catheterization in March 2010 after an abnormal stress Cardiolite. The cardiac catheterization showed long term patency of the prior stent and nonobstructive atherosclerotic disease elsewhere and he had normal left ventricular function. He had a nuclear stress test on 11/13/10 and showed no ischemia and fair exercise capacity with a hypertensive blood pressure response and his ejection fraction was 58%. We saw him as a work in on 01/25/14 because of recurring chest pain. We scheduled him for a walking LexiScan Myoview. This showed significant reversible anterolateral ischemia.  On 02/10/14 the patient underwent cardiac catheterization with successful stenting of the second diagonal with a drug-eluting stent. He will need to be on dual antiplatelet therapy for one year at least. His Lipitor was also increased to 80 mg a day for aggressive lipid-lowering risk modification.  The patient has been in the cardiac rehabilitation program. The patient is scheduled for a complete physical next week with his PCP Dr. Brigitte Clark The patient has been experiencing a dry tickling cough.  This may be from the ramipril.  Current Outpatient Prescriptions  Medication Sig Dispense Refill  . aspirin 81 MG tablet Take 81 mg by mouth daily.       Jared Clark Kitchen atorvastatin (LIPITOR) 80 MG tablet Take 1 tablet (80 mg total) by mouth at bedtime.  90 tablet  3  . bismuth subsalicylate (KAOPECTATE) 262 MG/15ML suspension Take 30 mLs by mouth every 6 (six) hours as needed for indigestion or diarrhea or loose stools.      . calcium carbonate (TUMS  - DOSED IN MG ELEMENTAL CALCIUM) 500 MG chewable tablet Chew 1 tablet by mouth daily as needed for indigestion or heartburn.      . citalopram (CELEXA) 20 MG tablet Take 20 mg by mouth daily.       . clopidogrel (PLAVIX) 75 MG tablet Take 75 mg by mouth daily.       . finasteride (PROSCAR) 5 MG tablet Take 5 mg by mouth every other day.       . loperamide (IMODIUM) 2 MG capsule Take 2 mg by mouth as needed for diarrhea or loose stools.      . metoprolol succinate (TOPROL-XL) 25 MG 24 hr tablet Take 25 mg by mouth as directed. 1/2 tablet daily      . Multiple Vitamins-Minerals (MULTIVITAMIN PO) Take 1 tablet by mouth daily.      . nitroGLYCERIN (NITROSTAT) 0.4 MG SL tablet Place 1 tablet (0.4 mg total) under the tongue every 5 (five) minutes as needed for chest pain.  25 tablet  6  . Probiotic Product (PROBIOTIC DAILY PO) Take 1 capsule by mouth at bedtime.      . valACYclovir (VALTREX) 1000 MG tablet Take 1,000 mg by mouth as needed. Outbreak.      . vitamin C (ASCORBIC ACID) 500 MG tablet Take 500 mg by mouth daily.      Jared Clark Kitchen zolpidem (AMBIEN) 10 MG tablet Take 5 mg by mouth at bedtime as needed for sleep.      Jared Clark Kitchen losartan (COZAAR) 25 MG tablet Take 1 tablet (25 mg total) by  mouth daily.  90 tablet  3   No current facility-administered medications for this visit.    Allergies  Allergen Reactions  . Flexeril [Cyclobenzaprine] Swelling  . Methocarbamol Swelling  . Orphenadrine Citrate Swelling  . Ramipril     DRY COUGH    Patient Active Problem List   Diagnosis Date Noted  . Ischemic heart disease 03/19/2011    Priority: High  . Dyslipidemia 03/19/2011    Priority: Medium  . CAD (coronary artery disease) 02/11/2014  . Angina pectoris 02/10/2014  . Abnormal stress test 02/07/2014  . Family history of angina, class IV 02/07/2014  . Chest pain at rest 01/25/2014  . Benign hypertensive heart disease without heart failure 08/22/2011  . BPH (benign prostatic hyperplasia) 03/19/2011  .  Malaise and fatigue 03/19/2011    History  Smoking status  . Never Smoker   Smokeless tobacco  . Never Used    History  Alcohol Use  . Yes    Comment: RARE    Family History  Problem Relation Age of Onset  . Cancer Father     Lung Cancer    Review of Systems: The patient denies any heat or cold intolerance.  No weight gain or weight loss.  The patient denies headaches or blurry vision.  There is no cough or sputum production.  The patient denies dizziness.  There is no hematuria or hematochezia.  The patient denies any muscle aches or arthritis.  The patient denies any rash.  The patient denies frequent falling or instability.  There is no history of depression or anxiety.  All other systems were reviewed and are negative.   Physical Exam: Filed Vitals:   05/17/14 0954  BP: 123/76  Clark: 68   the general appearance reveals a well-developed large gentleman in no acute distress.The head and neck exam reveals pupils equal and reactive.  Extraocular movements are full.  There is no scleral icterus.  The mouth and pharynx are normal.  The neck is supple.  The carotids reveal no bruits.  The jugular venous pressure is normal.  The  thyroid is not enlarged.  There is no lymphadenopathy.  The chest is clear to percussion and auscultation.  There are no rales or rhonchi.  Expansion of the chest is symmetrical.  The precordium is quiet.  The first heart sound is normal.  The second heart sound is physiologically split.  There is no murmur gallop rub or click.  There is no abnormal lift or heave.  The abdomen is soft and nontender.  The bowel sounds are normal.  The liver and spleen are not enlarged.  There are no abdominal masses.  There are no abdominal bruits.  Extremities reveal good pedal pulses.  There is no phlebitis or edema.  There is no cyanosis or clubbing.  Strength is normal and symmetrical in all extremities.  There is no lateralizing weakness.  There are no sensory deficits.  The  skin is warm and dry.  There is no rash.      Assessment / Plan: 1.  Ischemic heart disease status post stenting of first obtuse marginal and 2003 and status post stenting of his second diagonal on 02/10/14 with a drug-eluting stent.  No recurrent angina. 2. benign hypertensive heart disease without heart failure 3. Dyslipidemia 4. cough possibly adverse effect of ramipril  Plan: The patient will continue on same medication.  I have encouraged him to continue to get regular aerobic exercise.  He has not been playing golf.  He lives in a [facility] center and has access to a workout room there. We will stop ramipril and start losartan and see if his cough improves. Recheck in 4 months for followup office visit.

## 2014-05-17 NOTE — Assessment & Plan Note (Signed)
Blood pressure has been remaining stable.  However because of dry tickling cough suspected possibly to be a side effect from Monopril we will stop ramipril at this point.  He will wait about 5-7 days and then start losartan 25 mg one daily.

## 2014-05-17 NOTE — Patient Instructions (Addendum)
STOP RAMIPRIL   START LOSARTAN 25 MG DAILY  Your physician wants you to follow-up in: [place] will receive a reminder letter in the mail two months in advance. If you don't receive a letter, please call our office to schedule the follow-up appointment.

## 2014-05-18 DIAGNOSIS — I10 Essential (primary) hypertension: Secondary | ICD-10-CM | POA: Diagnosis not present

## 2014-05-18 DIAGNOSIS — Z79899 Other long term (current) drug therapy: Secondary | ICD-10-CM | POA: Diagnosis not present

## 2014-05-18 DIAGNOSIS — E785 Hyperlipidemia, unspecified: Secondary | ICD-10-CM | POA: Diagnosis not present

## 2014-05-18 DIAGNOSIS — R7301 Impaired fasting glucose: Secondary | ICD-10-CM | POA: Diagnosis not present

## 2014-05-18 DIAGNOSIS — Z125 Encounter for screening for malignant neoplasm of prostate: Secondary | ICD-10-CM | POA: Diagnosis not present

## 2014-05-19 ENCOUNTER — Encounter (HOSPITAL_COMMUNITY)
Admission: RE | Admit: 2014-05-19 | Discharge: 2014-05-19 | Disposition: A | Discharge status: Home / Self Care | Payer: Medicare Other | Source: Ambulatory Visit | Attending: Cardiology | Admitting: Cardiology

## 2014-05-22 ENCOUNTER — Encounter (HOSPITAL_COMMUNITY)
Admission: RE | Admit: 2014-05-22 | Discharge: 2014-05-22 | Disposition: A | Discharge status: Home / Self Care | Payer: Medicare Other | Source: Ambulatory Visit | Attending: Cardiology | Admitting: Cardiology

## 2014-05-23 DIAGNOSIS — Z1212 Encounter for screening for malignant neoplasm of rectum: Secondary | ICD-10-CM | POA: Diagnosis not present

## 2014-05-24 ENCOUNTER — Other Ambulatory Visit (HOSPITAL_COMMUNITY): Payer: Self-pay | Admitting: Internal Medicine

## 2014-05-24 ENCOUNTER — Ambulatory Visit (HOSPITAL_COMMUNITY)
Admission: RE | Admit: 2014-05-24 | Discharge: 2014-05-24 | Disposition: A | Discharge status: Home / Self Care | Payer: Medicare Other | Source: Ambulatory Visit | Attending: Vascular Surgery | Admitting: Vascular Surgery

## 2014-05-24 ENCOUNTER — Encounter (HOSPITAL_COMMUNITY)
Admission: RE | Admit: 2014-05-24 | Discharge: 2014-05-24 | Disposition: A | Discharge status: Home / Self Care | Payer: Medicare Other | Source: Ambulatory Visit | Attending: Cardiology | Admitting: Cardiology

## 2014-05-24 DIAGNOSIS — I10 Essential (primary) hypertension: Secondary | ICD-10-CM | POA: Diagnosis not present

## 2014-05-24 DIAGNOSIS — I259 Chronic ischemic heart disease, unspecified: Secondary | ICD-10-CM | POA: Diagnosis not present

## 2014-05-24 DIAGNOSIS — E785 Hyperlipidemia, unspecified: Secondary | ICD-10-CM | POA: Diagnosis not present

## 2014-05-24 DIAGNOSIS — I6529 Occlusion and stenosis of unspecified carotid artery: Secondary | ICD-10-CM | POA: Insufficient documentation

## 2014-05-24 DIAGNOSIS — K219 Gastro-esophageal reflux disease without esophagitis: Secondary | ICD-10-CM | POA: Diagnosis not present

## 2014-05-24 DIAGNOSIS — Z23 Encounter for immunization: Secondary | ICD-10-CM | POA: Diagnosis not present

## 2014-05-24 DIAGNOSIS — Z1331 Encounter for screening for depression: Secondary | ICD-10-CM | POA: Diagnosis not present

## 2014-05-24 DIAGNOSIS — Z0000 Encounter for general adult medical examination without abnormal findings: Secondary | ICD-10-CM | POA: Diagnosis not present

## 2014-05-24 DIAGNOSIS — Z86010 Personal history of colonic polyps: Secondary | ICD-10-CM | POA: Diagnosis not present

## 2014-05-24 DIAGNOSIS — R7301 Impaired fasting glucose: Secondary | ICD-10-CM | POA: Diagnosis not present

## 2014-05-24 NOTE — Progress Notes (Signed)
Rehab report given to pt to take to PCP appt today.

## 2014-05-26 ENCOUNTER — Encounter (HOSPITAL_COMMUNITY)
Admission: RE | Admit: 2014-05-26 | Discharge: 2014-05-26 | Disposition: A | Discharge status: Home / Self Care | Payer: Medicare Other | Source: Ambulatory Visit | Attending: Cardiology | Admitting: Cardiology

## 2014-05-29 ENCOUNTER — Encounter (HOSPITAL_COMMUNITY)
Admission: RE | Admit: 2014-05-29 | Discharge: 2014-05-29 | Disposition: A | Discharge status: Home / Self Care | Payer: Medicare Other | Source: Ambulatory Visit | Attending: Cardiology | Admitting: Cardiology

## 2014-05-31 ENCOUNTER — Encounter (HOSPITAL_COMMUNITY)
Admission: RE | Admit: 2014-05-31 | Discharge: 2014-05-31 | Disposition: A | Discharge status: Home / Self Care | Payer: Medicare Other | Source: Ambulatory Visit | Attending: Cardiology | Admitting: Cardiology

## 2014-05-31 NOTE — Progress Notes (Signed)
Pt voices concern about continued fatigue and lack of motivation to be active.  Pt questions if medication adjustments would help. Pt advised to try taking metoprolol in the evening instead of AM to see if fatigue improved.  Otherwise pt encouraged to instill home exercise into his routine and return to previous hobbies that were pleasurable to him.  Pt instructed to avoid outside activities during extreme heat and humidity, however encouraged to resume golfing with friends once temperatures are less than 85 degrees.  Understanding verbalized.

## 2014-06-02 ENCOUNTER — Encounter (HOSPITAL_COMMUNITY): Payer: Medicare Other

## 2014-06-05 ENCOUNTER — Telehealth: Payer: Self-pay | Admitting: Cardiology

## 2014-06-05 ENCOUNTER — Encounter (HOSPITAL_COMMUNITY)
Admission: RE | Admit: 2014-06-05 | Discharge: 2014-06-05 | Disposition: A | Discharge status: Home / Self Care | Payer: Medicare Other | Source: Ambulatory Visit | Attending: Cardiology | Admitting: Cardiology

## 2014-06-05 NOTE — Telephone Encounter (Signed)
Patient has question regarding medication. Please call and advise.

## 2014-06-05 NOTE — Telephone Encounter (Signed)
Agree with plan 

## 2014-06-05 NOTE — Telephone Encounter (Signed)
1) Today at cardiac rehab during his cool down he had to elevate his legs for a short while because heart rate was not coming down as quickly as it usually does. He decided to start taking his Metoprolol Succ in the am starting tomorrow secondary to this.   2) Periodically he has had episode where he gets up in the am and starts his normal routine and will get diaphoretic and heart rate will go up above 100, today to 120, denies any discomfort. He will sit down and relax and returns to normal rate quickly. Blood pressure ranges from 120's-130's/60's-80's.  Current Metoprolol 25 mg 1/2 daily. Will forward to  Dr. Mare Ferrari for review

## 2014-06-05 NOTE — Progress Notes (Signed)
Pt heart rate elevated above target during exercise today at cardiac rehab with slow recovery during cool down. Pt states he may have missed dose of metoprolol and losartan last night. Pt also c/o tachycardia, diaphoresis and fatigue with minimal activities within his home. Pt reports these are relieved with rest.  Pt denies pain, dyspnea.  Pt states he would like to take his medication in the morning versus evening.  Pt advised this is appropriate, importance of consistency and making sure no missed doses reinforced.  Understanding verbalized.

## 2014-06-07 ENCOUNTER — Encounter (HOSPITAL_COMMUNITY)
Admission: RE | Admit: 2014-06-07 | Discharge: 2014-06-07 | Disposition: A | Discharge status: Home / Self Care | Payer: Medicare Other | Source: Ambulatory Visit | Attending: Cardiology | Admitting: Cardiology

## 2014-06-07 DIAGNOSIS — Z9861 Coronary angioplasty status: Secondary | ICD-10-CM | POA: Diagnosis not present

## 2014-06-07 DIAGNOSIS — I259 Chronic ischemic heart disease, unspecified: Secondary | ICD-10-CM | POA: Diagnosis not present

## 2014-06-07 DIAGNOSIS — Z5189 Encounter for other specified aftercare: Secondary | ICD-10-CM | POA: Diagnosis not present

## 2014-06-07 DIAGNOSIS — I119 Hypertensive heart disease without heart failure: Secondary | ICD-10-CM | POA: Diagnosis not present

## 2014-06-07 NOTE — Telephone Encounter (Signed)
Discussed with patient and he will call back next week if anymore of the early morning episodes

## 2014-06-12 ENCOUNTER — Encounter (HOSPITAL_COMMUNITY): Payer: Self-pay

## 2014-06-12 ENCOUNTER — Encounter (HOSPITAL_COMMUNITY)
Admission: RE | Admit: 2014-06-12 | Discharge: 2014-06-12 | Disposition: A | Discharge status: Home / Self Care | Payer: Medicare Other | Source: Ambulatory Visit | Attending: Cardiology | Admitting: Cardiology

## 2014-06-12 DIAGNOSIS — I259 Chronic ischemic heart disease, unspecified: Secondary | ICD-10-CM | POA: Diagnosis not present

## 2014-06-12 DIAGNOSIS — Z9861 Coronary angioplasty status: Secondary | ICD-10-CM | POA: Diagnosis not present

## 2014-06-12 DIAGNOSIS — I119 Hypertensive heart disease without heart failure: Secondary | ICD-10-CM | POA: Diagnosis not present

## 2014-06-12 DIAGNOSIS — Z5189 Encounter for other specified aftercare: Secondary | ICD-10-CM | POA: Diagnosis not present

## 2014-06-12 NOTE — Progress Notes (Signed)
Pt graduated from cardiac rehab program today.  Medication list reconciled.  PHQ9 score- 5. Pt is currently taking treatment which he feels is not effective. Mostly the patient is concerned about feelings of fatigue and lack of energy.   Pt feels most of the symptoms are related to boredom.  Pt has been very faithfully committed to participating in [facility] rehab program, however admits he does not have solid plan for exercise. Pt states he will probably exercise at either [facility] or [employer]  Pt encouraged to continue his current habit of exercising 3 days weekly at either gym and incorporating 2 more days of exercise. Pt also given information about the cardiac maintenance program.   Pt is hesitant to walk outside due to humidity and associated heavy perspiration.  Pt diet is improving and he is able to recognize satiety to decrease consumption when full. Pt is states commitment to continuing his lifestyle modifications.

## 2014-06-13 ENCOUNTER — Telehealth: Payer: Self-pay | Admitting: Cardiology

## 2014-06-13 NOTE — Telephone Encounter (Signed)
Left message stating Jared Clark is out of office today and if there is something I can help him with, pls call back today.  Otherwise I will forward the message to [name].

## 2014-06-13 NOTE — Telephone Encounter (Signed)
New message     Talk to [facility] about metoprolol

## 2014-06-14 ENCOUNTER — Encounter (HOSPITAL_COMMUNITY): Payer: Medicare Other

## 2014-06-14 NOTE — Telephone Encounter (Signed)
Patient phoned in to give update on heart rate. Last night heart rate in mid 60's but yesterday up to 124. With the elevated heart rate he did have some GI issues going on. Today heart rate up to 80's just walking from one side of the house to the other. Discussed with  Dr. Mare Ferrari and ok to take his Metoprolol 25 mg 1/2 tablet twice a day and monitor. Patient wanted to be called back in am

## 2014-06-15 NOTE — Telephone Encounter (Signed)
Advised patient, will call back with update

## 2014-06-16 ENCOUNTER — Encounter (HOSPITAL_COMMUNITY): Payer: Medicare Other

## 2014-10-11 ENCOUNTER — Ambulatory Visit (INDEPENDENT_AMBULATORY_CARE_PROVIDER_SITE_OTHER): Payer: Medicare Other | Admitting: Cardiology

## 2014-10-11 ENCOUNTER — Encounter: Payer: Self-pay | Admitting: Cardiology

## 2014-10-11 VITALS — BP 124/72 | HR 64 | Ht 76.0 in | Wt 259.0 lb

## 2014-10-11 DIAGNOSIS — I119 Hypertensive heart disease without heart failure: Secondary | ICD-10-CM | POA: Diagnosis not present

## 2014-10-11 DIAGNOSIS — I6529 Occlusion and stenosis of unspecified carotid artery: Secondary | ICD-10-CM | POA: Diagnosis not present

## 2014-10-11 DIAGNOSIS — E785 Hyperlipidemia, unspecified: Secondary | ICD-10-CM | POA: Diagnosis not present

## 2014-10-11 DIAGNOSIS — I259 Chronic ischemic heart disease, unspecified: Secondary | ICD-10-CM

## 2014-10-11 NOTE — Patient Instructions (Signed)
Your physician recommends that you continue on your current medications as directed. Please refer to the Current Medication list given to you today.  Your physician wants you to follow-up in: 4 month ov/ekg  You will receive a reminder letter in the mail two months in advance. If you don't receive a letter, please call our office to schedule the follow-up appointment.  

## 2014-10-11 NOTE — Assessment & Plan Note (Signed)
The patient is not having any recurrent chest pain or angina 

## 2014-10-11 NOTE — Progress Notes (Signed)
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 [name] He has a history of known ischemic heart disease. In September of 2003 he underwent stenting of his first obtuse marginal vessel. He had a repeat cardiac catheterization in March 2010 after an abnormal stress Cardiolite. The cardiac catheterization showed long term patency of the prior stent and nonobstructive atherosclerotic disease elsewhere and he had normal left ventricular function. He had a nuclear stress test on 11/13/10 and showed no ischemia and fair exercise capacity with a hypertensive blood pressure response and his ejection fraction was 58%. We saw him as a work in on 01/25/14 because of recurring chest pain. We scheduled him for a walking LexiScan Myoview. This showed significant reversible anterolateral ischemia.  On 02/10/14 the patient underwent cardiac catheterization with successful stenting of the second diagonal with a drug-eluting stent. He will need to be on dual antiplatelet therapy for one year at least. His Lipitor was also increased to 80 mg a day for aggressive lipid-lowering risk modification.  The patient has completed the cardiac rehabilitation program. he decided not to stay in the maintenance phase because of the expense.  The patient is scheduled for a complete physical next week with his PCP Dr. Brigitte Pulse The patient has been experiencing a dry tickling cough.  This may be from the ramipril.  Current Outpatient Prescriptions  Medication Sig Dispense Refill  . aspirin 81 MG tablet Take 81 mg by mouth daily.     [name] atorvastatin (LIPITOR) 80 MG tablet Take 1 tablet (80 mg total) by mouth at bedtime. 90 tablet 3  . calcium carbonate (TUMS - DOSED IN MG ELEMENTAL CALCIUM) 500 MG chewable tablet Chew 1 tablet by mouth daily as  needed for indigestion or heartburn.    . citalopram (CELEXA) 20 MG tablet Take 20 mg by mouth daily.     . clopidogrel (PLAVIX) 75 MG tablet Take 75 mg by mouth daily.     . finasteride (PROSCAR) 5 MG tablet Take 5 mg by mouth every other day.     . loperamide (IMODIUM) 2 MG capsule Take 2 mg by mouth as needed for diarrhea or loose stools.    [name] losartan (COZAAR) 25 MG tablet Take 1 tablet (25 mg total) by mouth daily. 90 tablet 3  . metoprolol succinate (TOPROL-XL) 25 MG 24 hr tablet Take 25 mg by mouth as directed. 1/2 tablet daily    . Multiple Vitamins-Minerals (MULTIVITAMIN PO) Take 1 tablet by mouth daily.    . nitroGLYCERIN (NITROSTAT) 0.4 MG SL tablet Place 1 tablet (0.4 mg total) under the tongue every 5 (five) minutes as needed for chest pain. 25 tablet 6  . Probiotic Product (PROBIOTIC DAILY PO) Take 1 capsule by mouth at bedtime.    . valACYclovir (VALTREX) 1000 MG tablet Take 1,000 mg by mouth as needed. Outbreak.    . vitamin C (ASCORBIC ACID) 500 MG tablet Take 500 mg by mouth daily.    [name] zolpidem (AMBIEN) 10 MG tablet Take 5 mg by mouth at bedtime as needed for sleep.    [name] bismuth subsalicylate (KAOPECTATE) 262 MG/15ML suspension Take 30 mLs by mouth every 6 (six) hours as needed for indigestion or diarrhea or loose stools.     No current facility-administered medications for this visit.  Allergies  Allergen Reactions  . Flexeril [Cyclobenzaprine] Swelling  . Methocarbamol Swelling  . Orphenadrine Citrate Swelling  . Ramipril     DRY COUGH    Patient Active Problem List   Diagnosis Date Noted  . Ischemic heart disease 03/19/2011    Priority: High  . Dyslipidemia 03/19/2011    Priority: Medium  . CAD (coronary artery disease) 02/11/2014  . Angina pectoris 02/10/2014  . Abnormal stress test 02/07/2014  . Family history of angina, class IV 02/07/2014  . Chest pain at rest 01/25/2014  . Benign hypertensive heart disease without heart failure 08/22/2011  . BPH  (benign prostatic hyperplasia) 03/19/2011  . Malaise and fatigue 03/19/2011    History  Smoking status  . Never Smoker   Smokeless tobacco  . Never Used    History  Alcohol Use  . Yes    Comment: RARE    Family History  Problem Relation Age of Onset  . Cancer Father     Lung Cancer    Review of Systems: The patient denies any heat or cold intolerance.  No weight gain or weight loss.  The patient denies headaches or blurry vision.  There is no cough or sputum production.  The patient denies dizziness.  There is no hematuria or hematochezia.  The patient denies any muscle aches or arthritis.  The patient denies any rash.  The patient denies frequent falling or instability.  There is no history of depression or anxiety.  All other systems were reviewed and are negative.   Physical Exam: Filed Vitals:   10/11/14 0814  BP: 124/72  Pulse: 64   the general appearance reveals a well-developed large gentleman in no acute distress.The head and neck exam reveals pupils equal and reactive.  Extraocular movements are full.  There is no scleral icterus.  The mouth and pharynx are normal.  The neck is supple.  The carotids reveal no bruits.  The jugular venous pressure is normal.  The  thyroid is not enlarged.  There is no lymphadenopathy.  The chest is clear to percussion and auscultation.  There are no rales or rhonchi.  Expansion of the chest is symmetrical.  The precordium is quiet.  The first heart sound is normal.  The second heart sound is physiologically split.  There is no murmur gallop rub or click.  There is no abnormal lift or heave.  The abdomen is soft and nontender.  The bowel sounds are normal.  The liver and spleen are not enlarged.  There are no abdominal masses.  There are no abdominal bruits.  Extremities reveal good pedal pulses.  There is no phlebitis or edema.  There is no cyanosis or clubbing.  Strength is normal and symmetrical in all extremities.  There is no lateralizing  weakness.  There are no sensory deficits.  The skin is warm and dry.  There is no rash.      Assessment / Plan: 1.  Ischemic heart disease status post stenting of first obtuse marginal and 2003 and status post stenting of his second diagonal on 02/10/14 with a drug-eluting stent.  No recurrent angina. 2. benign hypertensive heart disease without heart failure 3. Dyslipidemia 4. cough possibly adverse effect of ramipril,improved on losartan  Plan: continue current medication.  Work harder on weight loss and exercise. Recheck in 4 months for office visit and EKG.

## 2014-10-11 NOTE — Assessment & Plan Note (Signed)
The patient is tolerating 80 mg Lipitor.  No myalgias.  He does have some fatigue which is nonspecific.  Dr. Brigitte Pulse checks his lipids

## 2014-10-11 NOTE — Assessment & Plan Note (Signed)
Blood pressure is stable on current therapy.  No dizziness or syncope.  No headaches.  No shortness of breath.  No significant cough on losartan

## 2014-11-10 DIAGNOSIS — R51 Headache: Secondary | ICD-10-CM | POA: Diagnosis not present

## 2014-11-10 DIAGNOSIS — I10 Essential (primary) hypertension: Secondary | ICD-10-CM | POA: Diagnosis not present

## 2014-11-10 DIAGNOSIS — R110 Nausea: Secondary | ICD-10-CM | POA: Diagnosis not present

## 2014-11-10 DIAGNOSIS — R269 Unspecified abnormalities of gait and mobility: Secondary | ICD-10-CM | POA: Diagnosis not present

## 2014-11-10 DIAGNOSIS — Z6831 Body mass index (BMI) 31.0-31.9, adult: Secondary | ICD-10-CM | POA: Diagnosis not present

## 2014-11-13 ENCOUNTER — Other Ambulatory Visit (HOSPITAL_COMMUNITY): Payer: Self-pay | Admitting: Internal Medicine

## 2014-11-13 ENCOUNTER — Other Ambulatory Visit: Payer: Self-pay | Admitting: Internal Medicine

## 2014-11-13 DIAGNOSIS — I10 Essential (primary) hypertension: Secondary | ICD-10-CM | POA: Diagnosis not present

## 2014-11-13 DIAGNOSIS — R269 Unspecified abnormalities of gait and mobility: Secondary | ICD-10-CM

## 2014-11-13 DIAGNOSIS — R51 Headache: Principal | ICD-10-CM

## 2014-11-13 DIAGNOSIS — R519 Headache, unspecified: Secondary | ICD-10-CM

## 2014-11-14 ENCOUNTER — Ambulatory Visit (HOSPITAL_COMMUNITY)
Admission: RE | Admit: 2014-11-14 | Discharge: 2014-11-14 | Disposition: A | Discharge status: Home / Self Care | Payer: Medicare Other | Source: Ambulatory Visit | Attending: Internal Medicine | Admitting: Internal Medicine

## 2014-11-14 DIAGNOSIS — R51 Headache: Secondary | ICD-10-CM | POA: Insufficient documentation

## 2014-11-14 DIAGNOSIS — R269 Unspecified abnormalities of gait and mobility: Secondary | ICD-10-CM | POA: Diagnosis not present

## 2014-11-14 DIAGNOSIS — S0990XA Unspecified injury of head, initial encounter: Secondary | ICD-10-CM | POA: Diagnosis not present

## 2014-11-14 DIAGNOSIS — R519 Headache, unspecified: Secondary | ICD-10-CM

## 2014-11-14 MED ORDER — GADOBENATE DIMEGLUMINE 529 MG/ML IV SOLN
20.0000 mL | Freq: Once | INTRAVENOUS | Status: AC | PRN
  Administered 2014-11-14: 20 mL via INTRAVENOUS

## 2014-11-15 ENCOUNTER — Other Ambulatory Visit: Payer: Medicare Other

## 2014-11-16 ENCOUNTER — Encounter (HOSPITAL_COMMUNITY): Payer: Self-pay | Admitting: Cardiology

## 2015-02-07 ENCOUNTER — Encounter: Payer: Self-pay | Admitting: Cardiology

## 2015-02-07 ENCOUNTER — Ambulatory Visit (INDEPENDENT_AMBULATORY_CARE_PROVIDER_SITE_OTHER): Payer: Medicare Other | Admitting: Cardiology

## 2015-02-07 VITALS — BP 120/70 | HR 62 | Ht 76.5 in | Wt 261.0 lb

## 2015-02-07 DIAGNOSIS — R079 Chest pain, unspecified: Secondary | ICD-10-CM

## 2015-02-07 DIAGNOSIS — I259 Chronic ischemic heart disease, unspecified: Secondary | ICD-10-CM | POA: Diagnosis not present

## 2015-02-07 DIAGNOSIS — I119 Hypertensive heart disease without heart failure: Secondary | ICD-10-CM | POA: Diagnosis not present

## 2015-02-07 NOTE — Patient Instructions (Signed)
Your physician recommends that you continue on your current medications as directed. Please refer to the Current Medication list given to you today.  Your physician wants you to follow-up in: 6 month ov You will receive a reminder letter in the mail two months in advance. If you don't receive a letter, please call our office to schedule the follow-up appointment.  

## 2015-02-07 NOTE — Progress Notes (Signed)
Cardiology Office Note   Date:  02/07/2015   ID:  Jared Clark, DOB 1936/05/13, MRN [id number]  PCP:  Marton Redwood, MD  Cardiologist:   Darlin Coco, MD   Chief Complaint  Patient presents with  . Follow-up    Ischemic heart disease      History of Present Illness: Jared Clark is a 79 y.o. male who presents for follow-up office visit.  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 Jared Clark He has a history of known ischemic heart disease. In September of 2003 he underwent stenting of his first obtuse marginal vessel. He had a repeat cardiac catheterization in March 2010 after an abnormal stress Cardiolite. The cardiac catheterization showed long term patency of the prior stent and nonobstructive atherosclerotic disease elsewhere and he had normal left ventricular function. He had a nuclear stress test on 11/13/10 and showed no ischemia and fair exercise capacity with a hypertensive blood pressure response and his ejection fraction was 58%. We saw him as a work in on 01/25/14 because of recurring chest pain. We scheduled him for a walking LexiScan Myoview. This showed significant reversible anterolateral ischemia. On 02/10/14 the patient underwent cardiac catheterization with successful stenting of the second diagonal with a drug-eluting stent. He will need to be on dual antiplatelet therapy for one year at least. His Lipitor was also increased to 80 mg a day for aggressive lipid-lowering risk modification.  The patient has completed the cardiac rehabilitation program. he decided not to stay in the maintenance phase because of the expense.  Since last visit the patient has not had any recurrent chest pain or angina.  He has not had to take any sublingual nitroglycerin.  He has occasional vague discomfort in the left chest which is nonexertional and does not sound ischemic. He is concerned about his inability to lose weight.  He is considering  the proprietary diet plans. Past Medical History  Diagnosis Date  . Sinus bradycardia   . Dyslipidemia     History of dyslipidemia  . Mild depression   . BPH (benign prostatic hypertrophy)     S/P TURP 1995  . Hypertension   . Reflux OCCASIONAL    TAKES PRILOSEC  . Osteoarthritis     RIGHT ANKLE, BILATERAL WRIST, BACK  . Borderline diabetes     DIET CONTROLLED  . Cataract of right eye   . Coronary artery disease     a. 2003 s/p PCI/stenting; b. 02/2014 Abnl MV: antlat ischemia, EF 58%;  c. 02/2014 Cath/PCI: LM nl, LAD nl, D2 95p (2.5x12 Promus DES), LCX <20, OM1 patent stent, RCA 50d, EF 55-65%.  Jared Clark GERD (gastroesophageal reflux disease)     Past Surgical History  Procedure Laterality Date  . Lumbar laminectomy  1973    L4-S1  . Shoulder arthroscopy      Bilateral  . Coronary angioplasty  2003- FIRST OBTUSE MARGIANL VESSEL     X1 CYPHER STENT  . Cardiac catheterization  02-06-09    NO INTERVENTION  . Tonsillectomy and adenoidectomy  CHILD  . Cholecystectomy  6160    W/ UMBILICAL HERNIA REPAIR  . Hernia repair  7371    Umbilical  . Inguinal hernia repair  1985  . Transurethral resection of prostate  1995  . Cervical discectomy  1989    C4 - 5  . Pilonidal cyst excision  1961  . Knee arthroscopy  11/05/2011    Procedure: ARTHROSCOPY KNEE;  Surgeon: Pilar Plate  V Aluisio;  Location: [place];  Service: Orthopedics;  Laterality: Right;  RIGHT ARTHROSCOPY KNEE WITH DEBRIDEMENT, Chondroplasty  . Carotid stent    . Coronary stent placement      DES to first diagonal        DR Martinique  . Left heart catheterization with coronary angiogram N/A 02/10/2014    Procedure: LEFT HEART CATHETERIZATION WITH CORONARY ANGIOGRAM;  Surgeon: Peter M Martinique, MD;  Location: [facility] C[facility] LAB;  Service: Cardiovascular;  Laterality: N/A;     Current Outpatient Prescriptions  Medication Sig Dispense Refill  . aspirin 81 MG tablet Take 81 mg by mouth daily.     Jared Clark atorvastatin (LIPITOR) 80  MG tablet Take 1 tablet (80 mg total) by mouth at bedtime. 90 tablet 3  . bismuth subsalicylate (KAOPECTATE) 262 MG/15ML suspension Take 30 mLs by mouth every 6 (six) hours as needed for indigestion or diarrhea or loose stools.    . calcium carbonate (TUMS - DOSED IN MG ELEMENTAL CALCIUM) 500 MG chewable tablet Chew 1 tablet by mouth daily as needed for indigestion or heartburn.    . citalopram (CELEXA) 20 MG tablet Take 20 mg by mouth daily.     . clopidogrel (PLAVIX) 75 MG tablet Take 75 mg by mouth daily.     . finasteride (PROSCAR) 5 MG tablet Take 5 mg by mouth every other day.     . loperamide (IMODIUM) 2 MG capsule Take 2 mg by mouth as needed for diarrhea or loose stools.    Jared Clark losartan (COZAAR) 25 MG tablet Take 1 tablet (25 mg total) by mouth daily. 90 tablet 3  . metoprolol succinate (TOPROL-XL) 25 MG 24 hr tablet Take 25 mg by mouth as directed. 1/2 tablet daily    . Multiple Vitamins-Minerals (MULTIVITAMIN PO) Take 1 tablet by mouth daily.    . nitroGLYCERIN (NITROSTAT) 0.4 MG SL tablet Place 1 tablet (0.4 mg total) under the tongue every 5 (five) minutes as needed for chest pain. 25 tablet 6  . Probiotic Product (PROBIOTIC DAILY PO) Take 1 capsule by mouth at bedtime.    . valACYclovir (VALTREX) 1000 MG tablet Take 1,000 mg by mouth as needed. Outbreak.    . vitamin C (ASCORBIC ACID) 500 MG tablet Take 500 mg by mouth daily.    Jared Clark zolpidem (AMBIEN) 10 MG tablet Take 5 mg by mouth at bedtime as needed for sleep.     No current facility-administered medications for this visit.    Allergies:   Flexeril; Methocarbamol; Orphenadrine citrate; and Ramipril    Social History:  The patient  reports that he has never smoked. He has never used smokeless tobacco. He reports that he drinks alcohol. He reports that he does not use illicit drugs.   Family History:  The patient's family history includes Cancer in his father.    ROS:  Please see the history of present illness.   Otherwise,  review of systems are positive for none.   All other systems are reviewed and negative.    PHYSICAL EXAM: VS:  BP 120/70 mmHg  Pulse 62  Ht 6' 4.5" (1.943 m)  Wt 261 lb (118.389 kg)  BMI 31.36 kg/m2 , BMI Body mass index is 31.36 kg/(m^2). GEN: Well nourished, well developed, in no acute distress HEENT: normal Neck: no JVD, carotid bruits, or masses Cardiac: RRR; no murmurs, rubs, or gallops,no edema  Respiratory:  clear to auscultation bilaterally, normal work of breathing GI: soft, nontender, nondistended, + BS  MS: no deformity or atrophy Skin: warm and dry, no rash Neuro:  Strength and sensation are intact Psych: euthymic mood, full affect   EKG:  EKG is ordered today. The ekg ordered today demonstrates normal sinus rhythm and nonspecific interventricular conduction disturbance and is unchanged since 02/11/14   Recent Labs: 02/11/2014: BUN 13; Creatinine 0.83; Hemoglobin 14.4; Platelets 185; Potassium 4.4; Sodium 137    Lipid Panel    Component Value Date/Time   CHOL 137 09/15/2011 0950   TRIG 77.0 09/15/2011 0950   HDL 47.30 09/15/2011 0950   CHOLHDL 3 09/15/2011 0950   VLDL 15.4 09/15/2011 0950   LDLCALC 74 09/15/2011 0950      Wt Readings from Last 3 Encounters:  02/07/15 261 lb (118.389 kg)  10/11/14 259 lb (117.482 kg)  05/17/14 258 lb (117.028 kg)         ASSESSMENT AND PLAN:  1. Ischemic heart disease status post stenting of first obtuse marginal and 2003 and status post stenting of his second diagonal on 02/10/14 with a drug-eluting stent. No recurrent angina. 2. benign hypertensive heart disease without heart failure 3. Dyslipidemia 4.  Exogenous obesity  Plan: continue current medication. Work harder on weight loss and exercise. Recheck in 70months for office visit .  He will be seeing Dr. Brigitte Pulse in June and he will be getting lab work at that time.   Current medicines are reviewed at length with the patient today.  The patient does not have  concerns regarding medicines.  The following changes have been made:  no change    Orders Placed This Encounter  Procedures  . EKG 12-Lead     Disposition:   FU with Dr. Mare Ferrari in 6 months for office visit   Signed, Darlin Coco, MD  02/07/2015 1:04 PM    [facility] [address], [place], [place]  [address] Phone: [phone]; Fax: [phone]

## 2015-02-21 DIAGNOSIS — H2513 Age-related nuclear cataract, bilateral: Secondary | ICD-10-CM | POA: Diagnosis not present

## 2015-02-21 DIAGNOSIS — H5213 Myopia, bilateral: Secondary | ICD-10-CM | POA: Diagnosis not present

## 2015-02-23 DIAGNOSIS — J309 Allergic rhinitis, unspecified: Secondary | ICD-10-CM | POA: Diagnosis not present

## 2015-02-23 DIAGNOSIS — R05 Cough: Secondary | ICD-10-CM | POA: Diagnosis not present

## 2015-02-23 DIAGNOSIS — Z6830 Body mass index (BMI) 30.0-30.9, adult: Secondary | ICD-10-CM | POA: Diagnosis not present

## 2015-02-23 DIAGNOSIS — J209 Acute bronchitis, unspecified: Secondary | ICD-10-CM | POA: Diagnosis not present

## 2015-02-27 ENCOUNTER — Other Ambulatory Visit: Payer: Self-pay | Admitting: Cardiology

## 2015-03-06 IMAGING — US US ABDOMEN COMPLETE
1 series · 13 of 25 positions shown · non-contrast
Comparison: None.

CLINICAL DATA: Right upper quadrant pain

COMPLETE ABDOMINAL ULTRASOUND

[Series 1: us abdomen complete · 0.49mm/px · 13 of 71 slices shown]
[im 1/71]
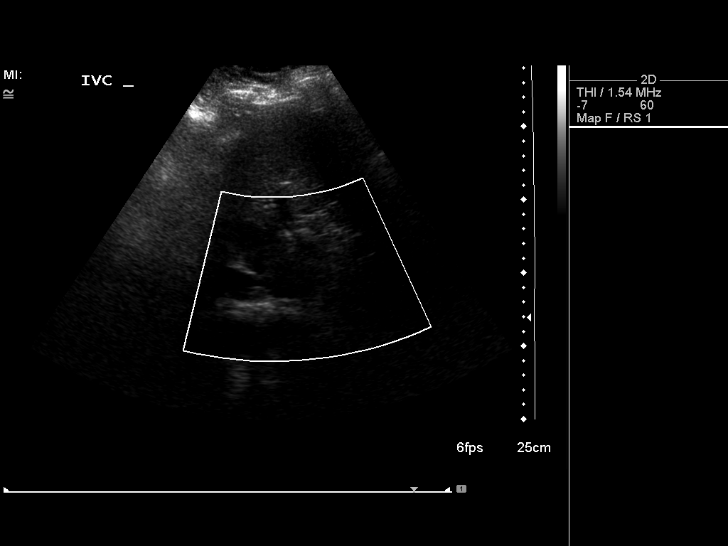
[im 6/71]
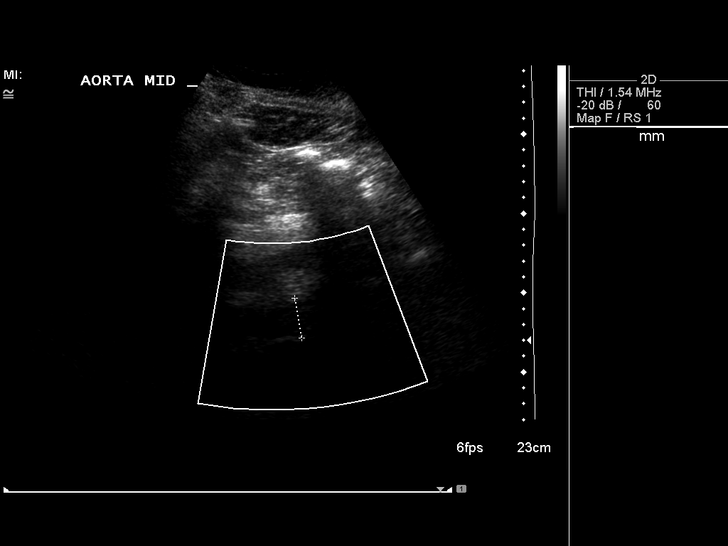
[im 12/71]
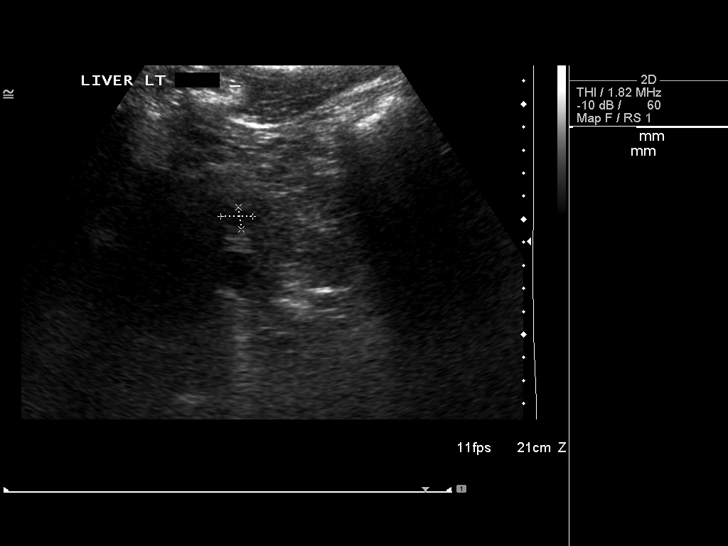
[im 18/71]
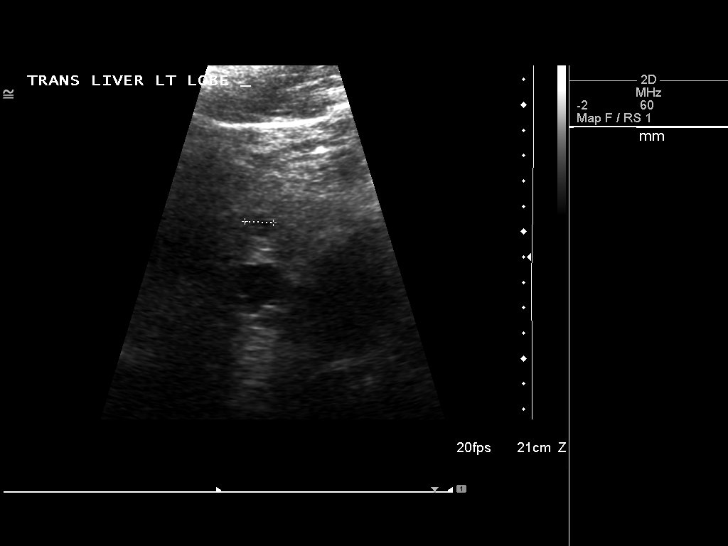
[im 24/71]
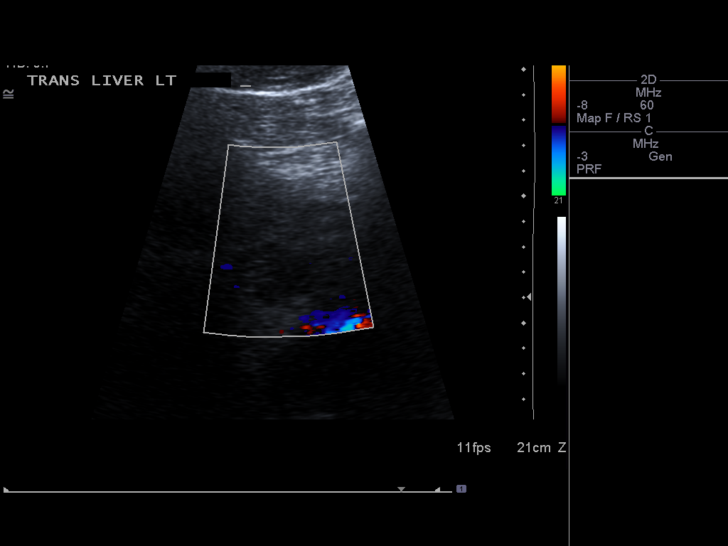
[im 30/71]
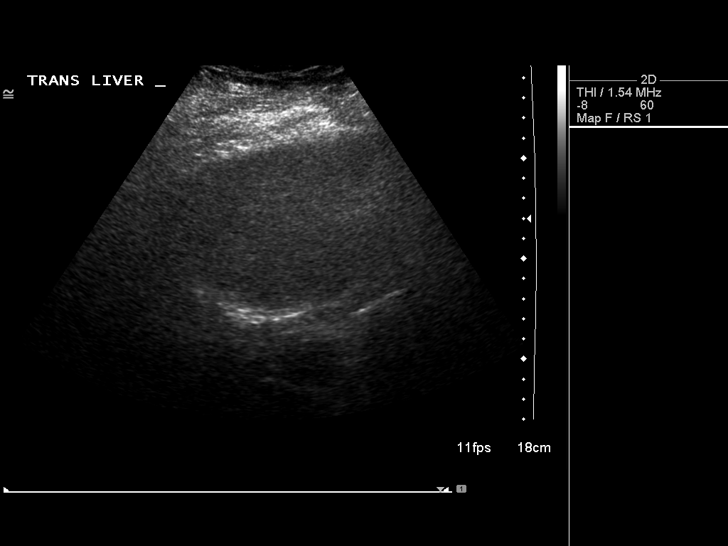
[im 36/71]
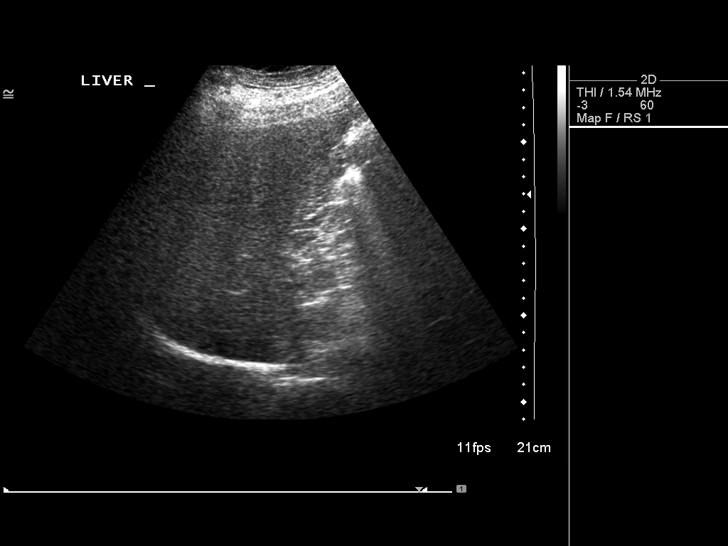
[im 41/71]
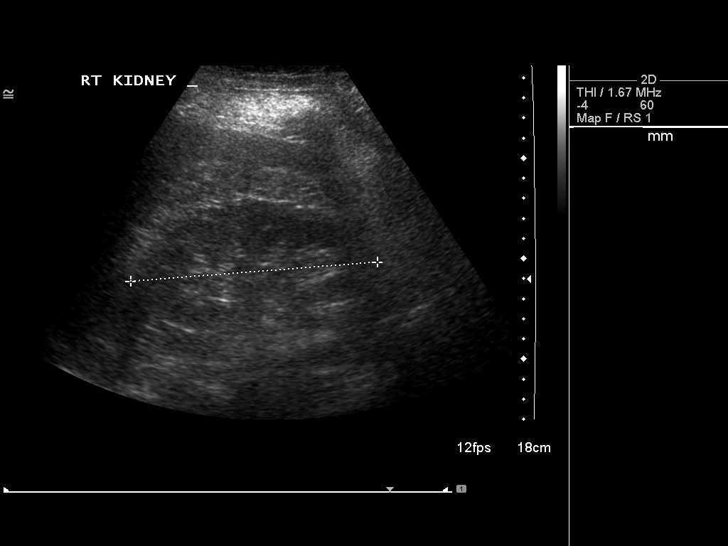
[im 47/71]
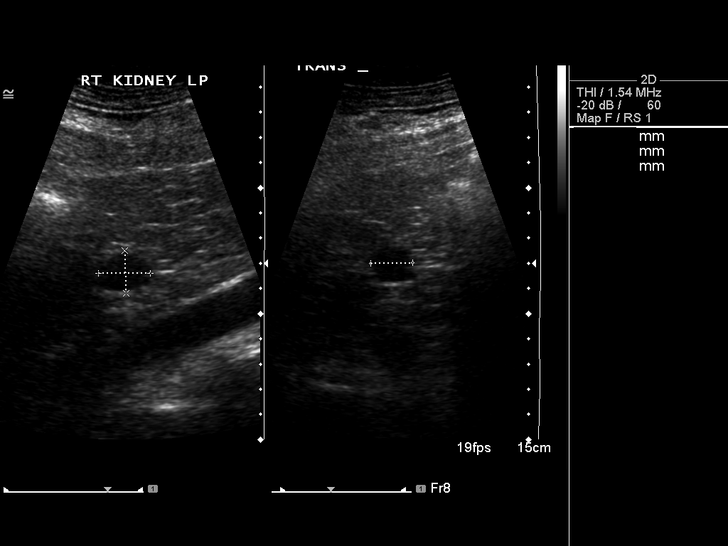
[im 53/71]
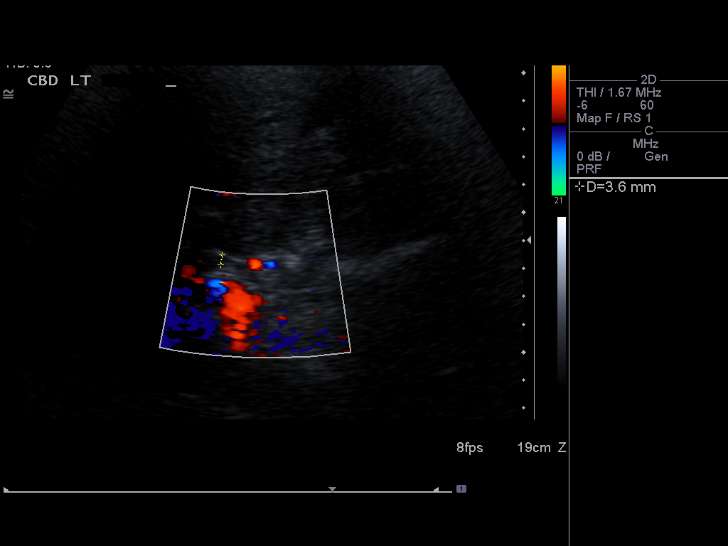
[im 59/71]
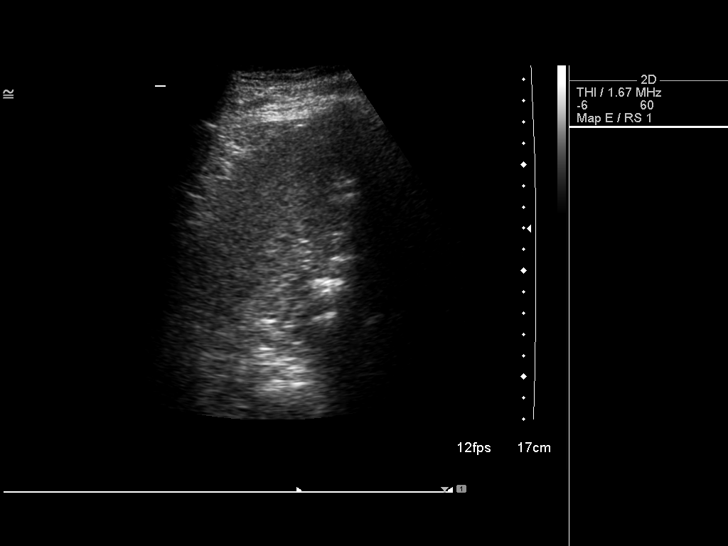
[im 65/71]
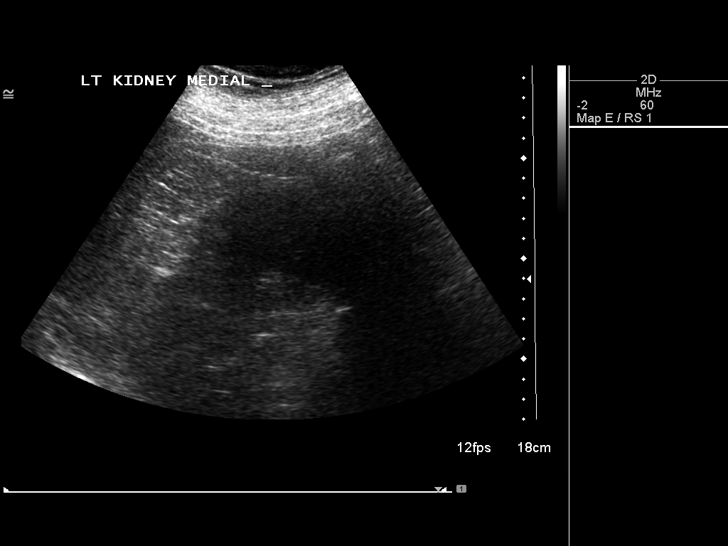
[im 71/71]
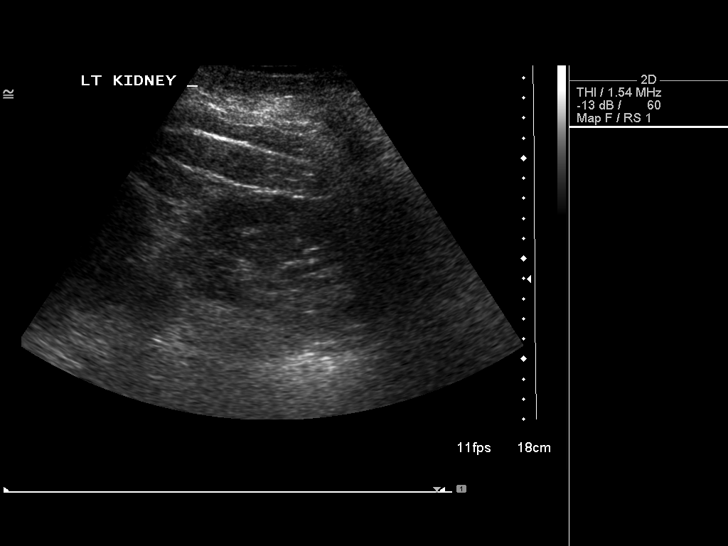

[13 of 25 positions shown; findings below may reference images not displayed]

FINDINGS: Gallbladder:  The gallbladder has previously been resected.  There
is no pain over the right upper quadrant with compression.

Common bile duct:  The common bile duct is normal measuring 3.6 mm
in diameter.

Liver:  The liver is echogenic consistent with diffuse fatty
infiltration.  Several small complex cystic areas are noted in the
left lobe, one measuring 1.4 cm in maximum diameter with a second
measuring 3.0 cm.  The liver measures 16.2 cm sagittally.

IVC:  Appears normal.

Pancreas:  The pancreas is obscured by bowel gas and cannot be
evaluated.

Spleen:  The spleen is normal measuring 9.0 cm sagittally.

Right Kidney:  No hydronephrosis is seen.  The right kidney
measures 12.3 cm sagittally.  A lower pole cyst is noted of 2.1 cm.

Left Kidney:  No hydronephrosis is noted.  The left kidney measures
10.4 cm.

Abdominal aorta:  The abdominal aorta is normal in caliber with
atheromatous change present.

Much of this study is limited due to a large amount of bowel gas.
IMPRESSION: 1.  Echogenic liver parenchyma consistent with fatty infiltration.
2.  Two mildly complex cysts in the left lobe of liver.
3.  The pancreas is completely obscured by bowel gas.
4.  Prior cholecystectomy.  No pain over the right upper quadrant.

## 2015-03-12 DIAGNOSIS — J209 Acute bronchitis, unspecified: Secondary | ICD-10-CM | POA: Diagnosis not present

## 2015-04-05 DIAGNOSIS — H25812 Combined forms of age-related cataract, left eye: Secondary | ICD-10-CM | POA: Diagnosis not present

## 2015-04-05 DIAGNOSIS — H2512 Age-related nuclear cataract, left eye: Secondary | ICD-10-CM | POA: Diagnosis not present

## 2015-04-06 ENCOUNTER — Other Ambulatory Visit: Payer: Self-pay | Admitting: Cardiology

## 2015-04-19 DIAGNOSIS — H25811 Combined forms of age-related cataract, right eye: Secondary | ICD-10-CM | POA: Diagnosis not present

## 2015-04-19 DIAGNOSIS — H2511 Age-related nuclear cataract, right eye: Secondary | ICD-10-CM | POA: Diagnosis not present

## 2015-05-22 DIAGNOSIS — R7301 Impaired fasting glucose: Secondary | ICD-10-CM | POA: Diagnosis not present

## 2015-05-22 DIAGNOSIS — Z125 Encounter for screening for malignant neoplasm of prostate: Secondary | ICD-10-CM | POA: Diagnosis not present

## 2015-05-22 DIAGNOSIS — I10 Essential (primary) hypertension: Secondary | ICD-10-CM | POA: Diagnosis not present

## 2015-05-22 DIAGNOSIS — E785 Hyperlipidemia, unspecified: Secondary | ICD-10-CM | POA: Diagnosis not present

## 2015-05-22 DIAGNOSIS — Z0000 Encounter for general adult medical examination without abnormal findings: Secondary | ICD-10-CM | POA: Diagnosis not present

## 2015-05-25 DIAGNOSIS — Z1212 Encounter for screening for malignant neoplasm of rectum: Secondary | ICD-10-CM | POA: Diagnosis not present

## 2015-05-25 LAB — IFOBT (OCCULT BLOOD): IFOBT: NEGATIVE

## 2015-05-29 DIAGNOSIS — I6529 Occlusion and stenosis of unspecified carotid artery: Secondary | ICD-10-CM | POA: Diagnosis not present

## 2015-05-29 DIAGNOSIS — Z0000 Encounter for general adult medical examination without abnormal findings: Secondary | ICD-10-CM | POA: Diagnosis not present

## 2015-05-29 DIAGNOSIS — Z6831 Body mass index (BMI) 31.0-31.9, adult: Secondary | ICD-10-CM | POA: Diagnosis not present

## 2015-05-29 DIAGNOSIS — Z1389 Encounter for screening for other disorder: Secondary | ICD-10-CM | POA: Diagnosis not present

## 2015-05-29 DIAGNOSIS — I10 Essential (primary) hypertension: Secondary | ICD-10-CM | POA: Diagnosis not present

## 2015-05-29 DIAGNOSIS — K219 Gastro-esophageal reflux disease without esophagitis: Secondary | ICD-10-CM | POA: Diagnosis not present

## 2015-05-29 DIAGNOSIS — M159 Polyosteoarthritis, unspecified: Secondary | ICD-10-CM | POA: Diagnosis not present

## 2015-05-29 DIAGNOSIS — R7301 Impaired fasting glucose: Secondary | ICD-10-CM | POA: Diagnosis not present

## 2015-05-29 DIAGNOSIS — E669 Obesity, unspecified: Secondary | ICD-10-CM | POA: Diagnosis not present

## 2015-05-29 DIAGNOSIS — F328 Other depressive episodes: Secondary | ICD-10-CM | POA: Diagnosis not present

## 2015-05-29 DIAGNOSIS — E785 Hyperlipidemia, unspecified: Secondary | ICD-10-CM | POA: Diagnosis not present

## 2015-05-29 DIAGNOSIS — Z86010 Personal history of colonic polyps: Secondary | ICD-10-CM | POA: Diagnosis not present

## 2015-05-31 ENCOUNTER — Other Ambulatory Visit (HOSPITAL_COMMUNITY): Payer: Self-pay | Admitting: Internal Medicine

## 2015-05-31 ENCOUNTER — Ambulatory Visit (HOSPITAL_COMMUNITY)
Admission: RE | Admit: 2015-05-31 | Discharge: 2015-05-31 | Disposition: A | Discharge status: Home / Self Care | Payer: Medicare Other | Source: Ambulatory Visit | Attending: Vascular Surgery | Admitting: Vascular Surgery

## 2015-05-31 DIAGNOSIS — I6529 Occlusion and stenosis of unspecified carotid artery: Secondary | ICD-10-CM | POA: Diagnosis not present

## 2015-05-31 DIAGNOSIS — I6521 Occlusion and stenosis of right carotid artery: Secondary | ICD-10-CM | POA: Diagnosis not present

## 2015-06-08 ENCOUNTER — Telehealth: Payer: Self-pay

## 2015-06-08 NOTE — Telephone Encounter (Signed)
Rec'd a voice message from pt. inquiring why his last Carotid ultrasound of 05/31/15 was compared to his 2014 study, instead of his last study on 05/24/14.  Will forward this question to the Vascular Lab Manager.

## 2015-07-03 ENCOUNTER — Other Ambulatory Visit: Payer: Self-pay | Admitting: Cardiology

## 2015-08-06 DIAGNOSIS — H26492 Other secondary cataract, left eye: Secondary | ICD-10-CM | POA: Diagnosis not present

## 2015-08-07 ENCOUNTER — Encounter: Payer: Self-pay | Admitting: Cardiology

## 2015-08-07 ENCOUNTER — Ambulatory Visit (INDEPENDENT_AMBULATORY_CARE_PROVIDER_SITE_OTHER): Payer: Medicare Other | Admitting: Cardiology

## 2015-08-07 VITALS — BP 138/70 | HR 60 | Ht 76.5 in | Wt 256.0 lb

## 2015-08-07 DIAGNOSIS — E785 Hyperlipidemia, unspecified: Secondary | ICD-10-CM

## 2015-08-07 DIAGNOSIS — I259 Chronic ischemic heart disease, unspecified: Secondary | ICD-10-CM | POA: Diagnosis not present

## 2015-08-07 DIAGNOSIS — I119 Hypertensive heart disease without heart failure: Secondary | ICD-10-CM | POA: Diagnosis not present

## 2015-08-07 NOTE — Progress Notes (Signed)
Cardiology Office Note   Date:  08/07/2015   ID:  Jared Clark, Jared Clark 11, 1937, MRN [id number]  PCP:  Marton Redwood, MD  Cardiologist: Darlin Coco MD  Chief Complaint  Patient presents with  . Coronary Artery Disease      History of Present Illness: Jared Clark is a 79 y.o. male who presents for a six-month follow-up office visit.  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 Jared Clark He has a history of known ischemic heart disease. In September of 2003 he underwent stenting of his first obtuse marginal vessel. He had a repeat cardiac catheterization in March 2010 after an abnormal stress Cardiolite. The cardiac catheterization showed long term patency of the prior stent and nonobstructive atherosclerotic disease elsewhere and he had normal left ventricular function. He had a nuclear stress test on 11/13/10 and showed no ischemia and fair exercise capacity with a hypertensive blood pressure response and his ejection fraction was 58%. We saw him as a work in on 01/25/14 because of recurring chest pain. We scheduled him for a walking LexiScan Myoview. This showed significant reversible anterolateral ischemia. On 02/10/14 the patient underwent cardiac catheterization with successful stenting of the second diagonal with a drug-eluting stent. He will need to be on dual antiplatelet therapy for one year at least. His Lipitor was also increased to 80 mg a day for aggressive lipid-lowering risk modification.  The patient has completed the cardiac rehabilitation program. he decided not to stay in the maintenance phase because of the expense.  Since last visit the patient has not had any recurrent chest pain or angina. He has not had to take any sublingual nitroglycerin. He has occasional vague discomfort in the left chest which is nonexertional and does not sound ischemic. He has been more careful with his diet.  His weight is down 5 pounds Past  Medical History           Past Medical History  Diagnosis Date  . Sinus bradycardia   . Dyslipidemia     History of dyslipidemia  . Mild depression   . BPH (benign prostatic hypertrophy)     S/P TURP 1995  . Hypertension   . Reflux OCCASIONAL    TAKES PRILOSEC  . Osteoarthritis     RIGHT ANKLE, BILATERAL WRIST, BACK  . Borderline diabetes     DIET CONTROLLED  . Cataract of right eye   . Coronary artery disease     a. 2003 s/p PCI/stenting; b. 02/2014 Abnl MV: antlat ischemia, EF 58%;  c. 02/2014 Cath/PCI: LM nl, LAD nl, D2 95p (2.5x12 Promus DES), LCX <20, OM1 patent stent, RCA 50d, EF 55-65%.  Jared Clark GERD (gastroesophageal reflux disease)     Past Surgical History  Procedure Laterality Date  . Lumbar laminectomy  1973    L4-S1  . Shoulder arthroscopy      Bilateral  . Coronary angioplasty  2003- FIRST OBTUSE MARGIANL VESSEL     X1 CYPHER STENT  . Cardiac catheterization  02-06-09    NO INTERVENTION  . Tonsillectomy and adenoidectomy  CHILD  . Cholecystectomy  6222    W/ UMBILICAL HERNIA REPAIR  . Hernia repair  9798    Umbilical  . Inguinal hernia repair  1985  . Transurethral resection of prostate  1995  . Cervical discectomy  1989    C4 - 5  . Pilonidal cyst excision  1961  . Knee arthroscopy  11/05/2011    Procedure: ARTHROSCOPY  KNEE;  Surgeon: Dione Plover Aluisio;  Location: [place];  Service: Orthopedics;  Laterality: Right;  RIGHT ARTHROSCOPY KNEE WITH DEBRIDEMENT, Chondroplasty  . Carotid stent    . Coronary stent placement      DES to first diagonal        DR Martinique  . Left heart catheterization with coronary angiogram N/A 02/10/2014    Procedure: LEFT HEART CATHETERIZATION WITH CORONARY ANGIOGRAM;  Surgeon: Peter M Martinique, MD;  Location[facility] CATH LAB;  Service: Cardiovascular;  Laterality: N/A;     Current Outpatient Prescriptions  Medication Sig Dispense Refill  . aspirin 81 MG tablet Take 81 mg by mouth daily.     Jared Clark atorvastatin (LIPITOR)  80 MG tablet Take 1 tablet (80 mg total) by mouth at bedtime. 90 tablet 3  . bismuth subsalicylate (KAOPECTATE) 262 MG/15ML suspension Take 30 mLs by mouth every 6 (six) hours as needed for indigestion or diarrhea or loose stools.    . calcium carbonate (TUMS - DOSED IN MG ELEMENTAL CALCIUM) 500 MG chewable tablet Chew 1 tablet by mouth daily as needed for indigestion or heartburn.    . citalopram (CELEXA) 20 MG tablet Take 20 mg by mouth daily.     . clopidogrel (PLAVIX) 75 MG tablet Take 75 mg by mouth daily.     . finasteride (PROSCAR) 5 MG tablet Take 5 mg by mouth every other day.     . loperamide (IMODIUM) 2 MG capsule Take 2 mg by mouth as needed for diarrhea or loose stools.    Jared Clark losartan (COZAAR) 25 MG tablet Take 1 tablet by mouth  daily 90 tablet 1  . metoprolol succinate (TOPROL-XL) 25 MG 24 hr tablet Take 25 mg by mouth as directed. 1/2 tablet daily    . Multiple Vitamins-Minerals (MULTIVITAMIN PO) Take 1 tablet by mouth daily.    . nitroGLYCERIN (NITROSTAT) 0.4 MG SL tablet Place 1 tablet (0.4 mg total) under the tongue every 5 (five) minutes as needed for chest pain. 25 tablet 6  . Probiotic Product (PROBIOTIC DAILY PO) Take 1 capsule by mouth at bedtime.    . valACYclovir (VALTREX) 1000 MG tablet Take 1,000 mg by mouth as needed. Outbreak.    . vitamin C (ASCORBIC ACID) 500 MG tablet Take 500 mg by mouth daily.    Jared Clark zolpidem (AMBIEN) 10 MG tablet Take 5 mg by mouth at bedtime as needed for sleep.     No current facility-administered medications for this visit.    Allergies:   Flexeril; Methocarbamol; Orphenadrine citrate; and Ramipril    Social History:  The patient  reports that he has never smoked. He has never used smokeless tobacco. He reports that he drinks alcohol. He reports that he does not use illicit drugs.   Family History:  The patient's family history includes Cancer in his father; Hypertension in his mother. There is no history of Heart attack or Stroke.     ROS:  Please see the history of present illness.   Otherwise, review of systems are positive for none.   All other systems are reviewed and negative.    PHYSICAL EXAM: VS:  BP 138/70 mmHg  Pulse 60  Ht 6' 4.5" (1.943 m)  Wt 256 lb (116.121 kg)  BMI 30.76 kg/m2 , BMI Body mass index is 30.76 kg/(m^2). GEN: Well nourished, well developed, in no acute distress HEENT: normal Neck: no JVD, carotid bruits, or masses Cardiac: RRR; no murmurs, rubs, or gallops,no edema  Respiratory:  clear to auscultation bilaterally, normal work of breathing GI: soft, nontender, nondistended, + BS MS: no deformity or atrophy Skin: warm and dry, no rash Neuro:  Strength and sensation are intact Psych: euthymic mood, full affect   EKG:  EKG is not ordered today.    Recent Labs: No results found for requested labs within last 365 days.    Lipid Panel    Component Value Date/Time   CHOL 137 09/15/2011 0950   TRIG 77.0 09/15/2011 0950   HDL 47.30 09/15/2011 0950   CHOLHDL 3 09/15/2011 0950   VLDL 15.4 09/15/2011 0950   LDLCALC 74 09/15/2011 0950      Wt Readings from Last 3 Encounters:  08/07/15 256 lb (116.121 kg)  02/07/15 261 lb (118.389 kg)  10/11/14 259 lb (117.482 kg)      Other studies Reviewed: Additional studies/ records that were reviewed today include: Records which the patient brought from Dr Tommy Rainwater office.  It shows that his cholesterol is down to 107 total triglycerides 74 and LDL 54.. Review of the above records demonstrates: Recent carotid duplex done at Dr. Raul Del shows improvement in the degree of carotid artery stenosis since the prior study.   ASSESSMENT AND PLAN:  1. Ischemic heart disease status post stenting of first obtuse marginal and 2003 and status post stenting of his second diagonal on 02/10/14 with a drug-eluting stent. No recurrent angina. 2. benign hypertensive heart disease without heart failure 3. Dyslipidemia, improved on atorvastatin 80 mg  daily 4. Exogenous obesity   Current medicines are reviewed at length with the patient today.  The patient does not have concerns regarding medicines.  The following changes have been made:  no change  Labs/ tests ordered today include:  No orders of the defined types were placed in this encounter.    Disposition, we will plan to see him every winter.  He will have his annual physical every summer with Dr. Brigitte Pulse.  Recheck next January for office visit and EKG.  Continue current medication.  Continue good diet and weight loss.  Berna Spare MD 08/07/2015 6:04 PM    [facility] [address], [place], [place]  [address] Phone: [phone]; Fax: [phone]

## 2015-08-07 NOTE — Patient Instructions (Addendum)
Medication Instructions:  Your physician recommends that you continue on your current medications as directed. Please refer to the Current Medication list given to you today.  Labwork: NONE  Testing/Procedures: NONE  Follow-Up: Your physician wants you to follow-up in: 6 months with Dr. Mare Ferrari.  You will receive a reminder letter in the mail two months in advance. If you don't receive a letter, please call our office to schedule the follow-up appointment.   Any Other Special Instructions Will Be Listed Below (If Applicable).

## 2015-08-16 DIAGNOSIS — H2640 Unspecified secondary cataract: Secondary | ICD-10-CM | POA: Diagnosis not present

## 2015-08-16 DIAGNOSIS — H26492 Other secondary cataract, left eye: Secondary | ICD-10-CM | POA: Diagnosis not present

## 2015-08-28 ENCOUNTER — Telehealth: Payer: Self-pay | Admitting: *Deleted

## 2015-08-28 NOTE — Telephone Encounter (Signed)
Spoke with patient and he has been having chest pain off and on over the last several weeks Pain is similar to when he had last stent but not as often or bad Scheduled appointment for patient tomorrow, ok per Tera Helper NP  Did advise to go to ED if worse

## 2015-08-29 ENCOUNTER — Ambulatory Visit
Admission: RE | Admit: 2015-08-29 | Discharge: 2015-08-29 | Disposition: A | Discharge status: Home / Self Care | Payer: Medicare Other | Source: Ambulatory Visit | Attending: Physician Assistant | Admitting: Physician Assistant

## 2015-08-29 ENCOUNTER — Encounter: Payer: Self-pay | Admitting: Physician Assistant

## 2015-08-29 ENCOUNTER — Ambulatory Visit (INDEPENDENT_AMBULATORY_CARE_PROVIDER_SITE_OTHER): Payer: Medicare Other | Admitting: Physician Assistant

## 2015-08-29 VITALS — BP 118/70 | HR 57 | Ht 76.0 in | Wt 257.0 lb

## 2015-08-29 DIAGNOSIS — R079 Chest pain, unspecified: Secondary | ICD-10-CM

## 2015-08-29 DIAGNOSIS — J189 Pneumonia, unspecified organism: Secondary | ICD-10-CM | POA: Diagnosis not present

## 2015-08-29 DIAGNOSIS — I779 Disorder of arteries and arterioles, unspecified: Secondary | ICD-10-CM

## 2015-08-29 DIAGNOSIS — I739 Peripheral vascular disease, unspecified: Secondary | ICD-10-CM

## 2015-08-29 DIAGNOSIS — I2510 Atherosclerotic heart disease of native coronary artery without angina pectoris: Secondary | ICD-10-CM | POA: Diagnosis not present

## 2015-08-29 DIAGNOSIS — Z9861 Coronary angioplasty status: Principal | ICD-10-CM

## 2015-08-29 DIAGNOSIS — I25110 Atherosclerotic heart disease of native coronary artery with unstable angina pectoris: Secondary | ICD-10-CM | POA: Diagnosis not present

## 2015-08-29 DIAGNOSIS — E785 Hyperlipidemia, unspecified: Secondary | ICD-10-CM

## 2015-08-29 DIAGNOSIS — I10 Essential (primary) hypertension: Secondary | ICD-10-CM

## 2015-08-29 DIAGNOSIS — I259 Chronic ischemic heart disease, unspecified: Secondary | ICD-10-CM

## 2015-08-29 DIAGNOSIS — R001 Bradycardia, unspecified: Secondary | ICD-10-CM

## 2015-08-29 HISTORY — DX: Disorder of arteries and arterioles, unspecified: I77.9

## 2015-08-29 NOTE — Progress Notes (Signed)
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  He saw Dr. Christin Bach earlier this month for vague left sided intermittent chest pain that did not sound ischemic in nature. This was followed conservatively. This had improved over the summer but returned about 3 weeks ago. It occurs 1-3x per day, in a pinpoint spot on his chest (but occasionally moves), and lasts up to a minute and a half. He hasn't had to take any NTG because by the time he tries to get home, the discomfort is already gone. When he feels this pain, he rubs the spot and it goes away. He also notices the chest will will be tender occasionally during that time. When he has these episodes he denies any SOB, nausea, vomiting, diaphoresis, palpitations, syncope or anything else unusual. He denies any LEE, orthopnea, or significant weight change. This pain sounds different than his prior angina espeically when reading through this notes. In 2003 he recalls his angina as axillary discomfort that would last several minutes, as well as exertional discomfort in his chest while swimming. He had recurrent discomfort in 02/2014 which occurred in the left upper chest, worse with exertion or walking out in the cold - this is what prompted stress test which subsequently resulted in PCI.   This discomfort in 2016 thus far has not occurred with exertion - tends to occur when he is doing nothing at all. He does not exercise but says he does not have any limiting symptoms when walking to the dining room at his living  facility, and had not [place] any problems [place].   Past Medical History  Diagnosis Date  . Sinus bradycardia   . Dyslipidemia     History of dyslipidemia  . Mild depression   . BPH (benign prostatic hypertrophy)     S/P TURP 1995  . Hypertension   . Reflux OCCASIONAL    TAKES PRILOSEC  . Osteoarthritis     RIGHT ANKLE, BILATERAL WRIST, BACK  . Borderline diabetes     DIET CONTROLLED  . Cataract of right eye   . Coronary artery disease     a. 2003 s/p PCI/stenting; b. 02/2014 Abnl nuc -> Cath/PCI: LM nl, LAD nl, D2 95p (2.5x12 Promus DES), LCX <20, OM1 patent stent, RCA 50d, EF 55-65%.  [name] GERD (gastroesophageal reflux disease)   . Carotid artery disease     a. Carotid duplex in 05/2013: 49-67% RICA, <59% LICA.    Past Surgical History  Procedure Laterality Date  . Lumbar laminectomy  1973    L4-S1  . Shoulder arthroscopy      Bilateral  . Coronary angioplasty  2003- FIRST OBTUSE MARGIANL VESSEL     X1 CYPHER STENT  . Cardiac catheterization  02-06-09    NO INTERVENTION  . Tonsillectomy and adenoidectomy  CHILD  . Cholecystectomy  1638    W/ UMBILICAL HERNIA REPAIR  . Hernia repair  4665    Umbilical  . Inguinal hernia repair  1985  . Transurethral resection of prostate  1995  .  Cervical discectomy  1989    C4 - 5  . Pilonidal cyst excision  1961  . Knee arthroscopy  11/05/2011    Procedure: ARTHROSCOPY KNEE;  Surgeon: Gearlean Alf;  Location: [facility];  Service: Orthopedics;  Laterality: Right;  RIGHT ARTHROSCOPY KNEE WITH DEBRIDEMENT, Chondroplasty  . Carotid stent    . Coronary stent placement      DES to first diagonal        DR Martinique  . Left heart catheterization with coronary angiogram N/A 02/10/2014    Procedure: LEFT HEART CATHETERIZATION WITH CORONARY ANGIOGRAM;  Surgeon: Peter M Martinique, MD;  Location[facility] CATH LAB;  Service: Cardiovascular;  Laterality: N/A;    Current Outpatient Prescriptions  Medication  Sig Dispense Refill  . aspirin 81 MG tablet Take 81 mg by mouth daily.     [name] atorvastatin (LIPITOR) 80 MG tablet Take 1 tablet (80 mg total) by mouth at bedtime. 90 tablet 3  . bismuth subsalicylate (KAOPECTATE) 262 MG/15ML suspension Take 30 mLs by mouth every 6 (six) hours as needed for indigestion or diarrhea or loose stools.    . calcium carbonate (TUMS - DOSED IN MG ELEMENTAL CALCIUM) 500 MG chewable tablet Chew 1 tablet by mouth daily as needed for indigestion or heartburn.    . citalopram (CELEXA) 20 MG tablet Take 20 mg by mouth daily.     . clopidogrel (PLAVIX) 75 MG tablet Take 75 mg by mouth daily.     . finasteride (PROSCAR) 5 MG tablet Take 5 mg by mouth every other day.     . loperamide (IMODIUM) 2 MG capsule Take 2 mg by mouth as needed for diarrhea or loose stools.    [name] losartan (COZAAR) 25 MG tablet Take 1 tablet by mouth  daily 90 tablet 1  . metoprolol succinate (TOPROL-XL) 25 MG 24 hr tablet Take 25 mg by mouth as directed. 1/2 tablet daily    . Multiple Vitamins-Minerals (MULTIVITAMIN PO) Take 1 tablet by mouth daily.    . nitroGLYCERIN (NITROSTAT) 0.4 MG SL tablet Place 1 tablet (0.4 mg total) under the tongue every 5 (five) minutes as needed for chest pain. 25 tablet 6  . Probiotic Product (PROBIOTIC DAILY PO) Take 1 capsule by mouth at bedtime.    . valACYclovir (VALTREX) 1000 MG tablet Take 1,000 mg by mouth as needed. Outbreak.    . vitamin C (ASCORBIC ACID) 500 MG tablet Take 500 mg by mouth daily.    [name] zolpidem (AMBIEN) 5 MG tablet Patient take 1/2 tablet by mouth as needed for sleep     No current facility-administered medications for this visit.    Allergies:   Flexeril; Methocarbamol; Orphenadrine citrate; and Ramipril   Social History:  The patient  reports that he has never smoked. He has never used smokeless tobacco. He reports that he drinks alcohol. He reports that he does not use illicit drugs.   Family History:  The patient's family history includes  Cancer in his father; Hypertension in his mother. There is no history of Heart attack or Stroke.  ROS:  Please see the history of present illness. All other systems are reviewed and otherwise negative.   PHYSICAL EXAM:  VS:  BP 118/70 mmHg  Pulse 57  Ht 6\' 4"  (1.93 m)  Wt 257 lb (116.574 kg)  BMI 31.30 kg/m2 BMI: Body mass index is 31.3 kg/(m^2). Well nourished, well developed WM, in no acute distress HEENT: normocephalic, atraumatic Neck: no JVD, carotid bruits  or masses Cardiac:  normal S1, S2; RRR; no murmurs, rubs, or gallops Lungs:  clear to auscultation bilaterally, no wheezing, rhonchi or rales Abd: soft, nontender, no hepatomegaly, + BS MS: no deformity or atrophy Ext: no edema Skin: warm and dry, no rash Neuro:  moves all extremities spontaneously, no focal abnormalities noted, follows commands Psych: euthymic mood, full affect   EKG:  Done today shows sinus bradycardia 57bpm, incomplete RBBB, TWI avL, ST upsloping in V2. ST uploping has been seen on prior tracings along with TWI.  Recent Labs: No results found for requested labs within last 365 days.  No results found for requested labs within last 365 days.   CrCl cannot be calculated (Patient has no serum creatinine result on file.).   Wt Readings from Last 3 Encounters:  08/29/15 257 lb (116.574 kg)  08/07/15 256 lb (116.121 kg)  02/07/15 261 lb (118.389 kg)     Other studies reviewed: Additional studies/records reviewed today include: summarized above  ASSESSMENT AND PLAN:  1. CAD with recent recurrence of atypical pain - I agree with Dr. Sherryl Barters prior assessment that chest discomfort does not sound ischemic. However, he has history of atypical symptoms in the past. I think it is reasonable to proceed with Lexiscan nuclear stress testing to risk stratify. This has reliably identified ischemia in the past. We will also check a 2V CXR to make sure there are no abnormalities in the thorax causing atypical  symptoms. He is going on a cruise in February and wants to make sure this is evaluated before then. Patient is already aware to act upon worsening warning signs. 2. Essential HTN - controlled. 3. Dyslipidemia - continue statin for secondary prevention. 4. Bilateral carotid artery disease - he reports more recently this was checked by PCP in 05/2015 showing 1-39% on the R, no significant disease on the left. F/u per primary care. 5. Sinus bradycardia - HR stable.  Disposition: F/u with Dr. Mare Ferrari in 1 month.  Current medicines are reviewed at length with the patient today.  The patient did not have any concerns regarding medicines.  Raechel Ache PA-C 08/29/2015 2:45 PM     [place] [address] [phone] (office)  [phone] (fax)

## 2015-08-29 NOTE — Patient Instructions (Addendum)
Medication Instructions:  Your physician recommends that you continue on your current medications as directed. Please refer to the Current Medication list given to you today.   Labwork: NONE ORDERED  Testing/Procedures: Your physician has requested that you have a lexiscan myoview. For further information please visit [email or web address]. Please follow instruction sheet, as given.  We have ordered a 2 VIEW CHEST X-RAY.  YOU CAN GO FROM 8:30 - 4:30 Monday - Friday AND GET THIS DONE WITHOUT AN APPT AT:  [facility], [place]. [phone].  Follow-Up: Your physician recommends that you schedule a follow-up appointment in: [place]   Any Other Special Instructions Will Be Listed Below (If Applicable).  Pharmacologic Stress Electrocardiogram A pharmacologic stress electrocardiogram is a heart (cardiac) test that uses nuclear imaging to evaluate the blood supply to your heart. This test may also be called a pharmacologic stress electrocardiography. Pharmacologic means that a medicine is used to increase your heart rate and blood pressure.  This stress test is done to find areas of poor blood flow to the heart by determining the extent of coronary artery disease (CAD). Some people exercise on a treadmill, which naturally increases the blood flow to the heart. For those people unable to exercise on a treadmill, a medicine is used. This medicine stimulates your heart and will cause your heart to beat harder and more quickly, as if you were exercising.  Pharmacologic stress tests can help determine:  The adequacy of blood flow to your heart during increased levels of activity in order to clear you for discharge home.  The extent of coronary artery blockage caused by CAD.  Your prognosis if you have suffered a heart attack.  The effectiveness of cardiac procedures done, such as an angioplasty, which can increase the circulation in your coronary  arteries.  Causes of chest pain or pressure. LET [facility] CARE PROVIDER KNOW ABOUT:  Any allergies you have.  All medicines you are taking, including vitamins, herbs, eye drops, creams, and over-the-counter medicines.  Previous problems you or members of your family have had with the use of anesthetics.  Any blood disorders you have.  Previous surgeries you have had.  Medical conditions you have.  Possibility of pregnancy, if this applies.  If you are currently breastfeeding. RISKS AND COMPLICATIONS Generally, this is a safe procedure. However, as with any procedure, complications can occur. Possible complications include:  You develop pain or pressure in the following areas:  Chest.  Jaw or neck.  Between your shoulder blades.  Radiating down your left arm.  Headache.  Dizziness or light-headedness.  Shortness of breath.  Increased or irregular heartbeat.  Low blood pressure.  Nausea or vomiting.  Flushing.  Redness going up the arm and slight pain during injection of medicine.  Heart attack (rare). BEFORE THE PROCEDURE   Avoid all forms of caffeine for 24 hours before your test or as directed by your health care provider. This includes coffee, tea (even decaffeinated tea), caffeinated sodas, chocolate, cocoa, and certain pain medicines.  Follow your health care provider's instructions regarding eating and drinking before the test.  Take your medicines as directed at regular times with water unless instructed otherwise. Exceptions may include:  If you have diabetes, ask how you are to take your insulin or pills. It is common to adjust insulin dosing the morning of the test.  If you are taking beta-blocker medicines, it is important to talk to your health care provider about these  medicines well before the date of your test. Taking beta-blocker medicines may interfere with the test. In some cases, these medicines need to be changed or stopped 24 hours or  more before the test.  If you wear a nitroglycerin patch, it may need to be removed prior to the test. Ask your health care provider if the patch should be removed before the test.  If you use an inhaler for any breathing condition, bring it with you to the test.  If you are an outpatient, bring a snack so you can eat right after the stress phase of the test.  Do not smoke for 4 hours prior to the test or as directed by your health care provider.  Do not apply lotions, powders, creams, or oils on your chest prior to the test.  Wear comfortable shoes and clothing. Let your health care provider know if you were unable to complete or follow the preparations for your test. PROCEDURE   Multiple patches (electrodes) will be put on your chest. If needed, small areas of your chest may be shaved to get better contact with the electrodes. Once the electrodes are attached to your body, multiple wires will be attached to the electrodes, and your heart rate will be monitored.  An IV access will be started. A nuclear trace (isotope) is given. The isotope may be given intravenously, or it may be swallowed. Nuclear refers to several types of radioactive isotopes, and the nuclear isotope lights up the arteries so that the nuclear images are clear. The isotope is absorbed by your body. This results in low radiation exposure.  A resting nuclear image is taken to show how your heart functions at rest.  A medicine is given through the IV access.  A second scan is done about 1 hour after the medicine injection and determines how your heart functions under stress.  During this stress phase, you will be connected to an electrocardiogram machine. Your blood pressure and oxygen levels will be monitored. AFTER THE PROCEDURE   Your heart rate and blood pressure will be monitored after the test.  You may return to your normal schedule, including diet,activities, and medicines, unless your health care provider  tells you otherwise. Document Released: 04/12/2009 Document Revised: 11/29/2013 Document Reviewed: 08/01/2013 [facility] Patient Information 2015 [place], [place]. This information is not intended to replace advice given to you by your health care provider. Make sure you discuss any questions you have with your health care provider.  [name]

## 2015-08-30 ENCOUNTER — Telehealth (HOSPITAL_COMMUNITY): Payer: Self-pay | Admitting: *Deleted

## 2015-08-30 ENCOUNTER — Encounter: Payer: Self-pay | Admitting: Physician Assistant

## 2015-08-30 NOTE — Telephone Encounter (Signed)
Attempted to call regarding upcoming appointment- no answer x 1, busy x1. Hubbard Robinson, RN

## 2015-09-03 ENCOUNTER — Telehealth (HOSPITAL_COMMUNITY): Payer: Self-pay | Admitting: *Deleted

## 2015-09-03 NOTE — Telephone Encounter (Signed)
Patient given detailed instructions per Myocardial Perfusion Study Information Sheet for test on 09/04/15 at 0730. Patient notified to arrive 15 minutes early and that it is imperative to arrive on time for appointment to keep from having the test rescheduled.  If you need to cancel or reschedule your appointment, please call the office within 24 hours of your appointment. Failure to do so may result in a cancellation of your appointment, and a $50 no show fee. Patient verbalized understanding. Hasspacher, Ranae Palms

## 2015-09-04 ENCOUNTER — Encounter: Payer: Self-pay | Admitting: Physician Assistant

## 2015-09-04 ENCOUNTER — Ambulatory Visit (HOSPITAL_COMMUNITY): Payer: Medicare Other | Attending: Cardiology

## 2015-09-04 DIAGNOSIS — R079 Chest pain, unspecified: Secondary | ICD-10-CM | POA: Insufficient documentation

## 2015-09-04 DIAGNOSIS — I2510 Atherosclerotic heart disease of native coronary artery without angina pectoris: Secondary | ICD-10-CM | POA: Diagnosis not present

## 2015-09-04 DIAGNOSIS — E785 Hyperlipidemia, unspecified: Secondary | ICD-10-CM | POA: Insufficient documentation

## 2015-09-04 DIAGNOSIS — Z9861 Coronary angioplasty status: Secondary | ICD-10-CM | POA: Insufficient documentation

## 2015-09-04 DIAGNOSIS — I10 Essential (primary) hypertension: Secondary | ICD-10-CM | POA: Insufficient documentation

## 2015-09-04 DIAGNOSIS — I779 Disorder of arteries and arterioles, unspecified: Secondary | ICD-10-CM | POA: Insufficient documentation

## 2015-09-04 LAB — MYOCARDIAL PERFUSION IMAGING
CSEPPHR: 71 {beats}/min
LHR: 0.3
LV dias vol: 132 mL
LVSYSVOL: 51 mL
Rest HR: 54 {beats}/min
SDS: 0
SRS: 6
SSS: 6
TID: 1.09

## 2015-09-04 MED ORDER — REGADENOSON 0.4 MG/5ML IV SOLN
0.4000 mg | Freq: Once | INTRAVENOUS | Status: AC
  Administered 2015-09-04: 0.4 mg via INTRAVENOUS

## 2015-09-04 MED ORDER — TECHNETIUM TC 99M SESTAMIBI GENERIC - CARDIOLITE
10.9000 | Freq: Once | INTRAVENOUS | Status: AC | PRN
  Administered 2015-09-04: 10.9 via INTRAVENOUS

## 2015-09-04 MED ORDER — TECHNETIUM TC 99M SESTAMIBI GENERIC - CARDIOLITE
32.8000 | Freq: Once | INTRAVENOUS | Status: AC | PRN
  Administered 2015-09-04: 32.8 via INTRAVENOUS

## 2015-09-08 DIAGNOSIS — Z23 Encounter for immunization: Secondary | ICD-10-CM | POA: Diagnosis not present

## 2015-09-11 ENCOUNTER — Ambulatory Visit: Payer: Medicare Other | Admitting: Physician Assistant

## 2015-09-28 ENCOUNTER — Encounter: Payer: Self-pay | Admitting: *Deleted

## 2015-10-02 ENCOUNTER — Encounter: Payer: Self-pay | Admitting: Physician Assistant

## 2015-10-02 DIAGNOSIS — R001 Bradycardia, unspecified: Secondary | ICD-10-CM | POA: Insufficient documentation

## 2015-10-02 DIAGNOSIS — I2510 Atherosclerotic heart disease of native coronary artery without angina pectoris: Secondary | ICD-10-CM | POA: Insufficient documentation

## 2015-10-02 DIAGNOSIS — Z9861 Coronary angioplasty status: Principal | ICD-10-CM

## 2015-10-02 DIAGNOSIS — I10 Essential (primary) hypertension: Secondary | ICD-10-CM

## 2015-10-02 HISTORY — DX: Atherosclerotic heart disease of native coronary artery without angina pectoris: I25.10

## 2015-10-02 HISTORY — DX: Essential (primary) hypertension: I10

## 2015-10-02 NOTE — Progress Notes (Addendum)
Cardiology Office Note Date:  10/03/2015  Patient ID:  Jared, Clark 11-20-1936, MRN [id number] PCP:  Marton Redwood, MD  Cardiologist: Dr. 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  He saw Dr. Mare Ferrari in August 2016 for vague left sided intermittent chest pain that did not sound ischemic in nature. This was followed conservatively. This had improved over the summer but returned in September. It was described as a pinpoint spot in his chest (but occasionally moves), lasting up to a minute in a half. See note from 08/29/15 for further details. He underwent nuclear stress testing that was normal; no evidence of ischemia, EF normal. Dr. Mare Ferrari also reviewed this scan. No further workup was required.   He returns for follow-up today. He still has intermittent fleeting pinpoint discomfort as described above which is not worse with exertion and not associated with any other concerning symptoms. He has not had any dyspnea or palpitations. He does have occasional joint pain (described as "cartilage problems") and wonders if this may be related to Lipitor after reading about his dose online. He reports labs are followed by Dr. Brigitte Pulse, last checked in June and said to be good.  Past Medical History  Diagnosis Date  . Sinus bradycardia   . Dyslipidemia     History of dyslipidemia  . Mild depression   . BPH (benign prostatic hypertrophy)     S/P TURP 1995  . Hypertension   . Reflux OCCASIONAL    TAKES PRILOSEC  . Osteoarthritis     RIGHT ANKLE, BILATERAL WRIST, BACK  . Borderline diabetes     DIET CONTROLLED  . Cataract of right eye   . Coronary artery disease     a. 2003 s/p PCI/stenting;  b. 02/2014 Abnl nuc -> Cath/PCI: LM nl, LAD nl, D2 95p (2.5x12 Promus DES), LCX <20, OM1 patent stent, RCA 50d, EF 55-65%. c. 08/2015: normal nuc.  [name] GERD (gastroesophageal reflux disease)   . Carotid artery disease ([place])     a. Carotid duplex in 05/2013: 70-78% RICA, <67% LICA. b. Per Patient, doppler 05/2015 showed 1-39% on the R - being folloewd by primary care.    Past Surgical History  Procedure Laterality Date  . Lumbar laminectomy  1973    L4-S1  . Shoulder arthroscopy      Bilateral  . Coronary angioplasty  2003- FIRST OBTUSE MARGIANL VESSEL     X1 CYPHER STENT  . Cardiac catheterization  02-06-09    NO INTERVENTION  . Tonsillectomy and adenoidectomy  CHILD  . Cholecystectomy  5449    W/ UMBILICAL HERNIA REPAIR  . Hernia repair  2010    Umbilical  . Inguinal hernia repair  1985  . Transurethral resection of prostate  1995  . Cervical discectomy  1989    C4 - 5  . Pilonidal cyst excision  1961  . Knee arthroscopy  11/05/2011    Procedure: ARTHROSCOPY KNEE;  Surgeon: Gearlean Alf;  Location: [place];  Service: Orthopedics;  Laterality: Right;  RIGHT ARTHROSCOPY KNEE WITH DEBRIDEMENT, Chondroplasty  . Carotid stent    . Coronary stent placement      DES to first diagonal  DR Martinique  . Left heart catheterization with coronary angiogram N/A 02/10/2014    Procedure: LEFT HEART CATHETERIZATION WITH CORONARY ANGIOGRAM;  Surgeon: Peter M Martinique, MD;  Location: [facility] CATH LAB;  Service: Cardiovascular;  Laterality: N/A;    Current Outpatient Prescriptions  Medication Sig Dispense Refill  . aspirin 81 MG tablet Take 81 mg by mouth daily.     [name] atorvastatin (LIPITOR) 80 MG tablet Take 1 tablet (80 mg total) by mouth at bedtime. 90 tablet 3  . bismuth subsalicylate (KAOPECTATE) 262 MG/15ML suspension Take 30 mLs by mouth every 6 (six) hours as needed for indigestion or diarrhea or loose stools.    . calcium carbonate (TUMS - DOSED IN MG ELEMENTAL CALCIUM) 500 MG  chewable tablet Chew 1 tablet by mouth daily as needed for indigestion or heartburn.    . citalopram (CELEXA) 20 MG tablet Take 20 mg by mouth daily.     . clopidogrel (PLAVIX) 75 MG tablet Take 75 mg by mouth daily.     . finasteride (PROSCAR) 5 MG tablet Take 5 mg by mouth every other day.     . loperamide (IMODIUM) 2 MG capsule Take 2 mg by mouth as needed for diarrhea or loose stools.    [name] losartan (COZAAR) 25 MG tablet Take 1 tablet by mouth  daily 90 tablet 1  . metoprolol succinate (TOPROL-XL) 25 MG 24 hr tablet Take 12.5 mg by mouth as directed. 1/2 tablet daily    . Multiple Vitamins-Minerals (MULTIVITAMIN PO) Take 1 tablet by mouth daily.    . nitroGLYCERIN (NITROSTAT) 0.4 MG SL tablet Place 0.4 mg under the tongue every 5 (five) minutes as needed for chest pain (x 3 tablets daily).    . Probiotic Product (PROBIOTIC DAILY PO) Take 1 capsule by mouth at bedtime.    . valACYclovir (VALTREX) 1000 MG tablet Take 1,000 mg by mouth as needed. Outbreak.    . vitamin C (ASCORBIC ACID) 500 MG tablet Take 500 mg by mouth daily.    [name] zolpidem (AMBIEN) 5 MG tablet Patient take 1/2 tablet by mouth as needed for sleep     No current facility-administered medications for this visit.    Allergies:   Flexeril; Methocarbamol; Orphenadrine citrate; and Ramipril   Social History:  The patient  reports that he has never smoked. He has never used smokeless tobacco. He reports that he drinks alcohol. He reports that he does not use illicit drugs.   Family History:  The patient's family history includes Hypertension in his mother; Lung cancer in his father. There is no history of Heart attack or Stroke.  ROS:  Please see the history of present illness. All other systems are reviewed and otherwise negative.   PHYSICAL EXAM:  VS:  BP 122/70 mmHg  Pulse 62  Ht 6\' 4"  (1.93 m)  Wt 260 lb (117.935 kg)  BMI 31.66 kg/m2 BMI: Body mass index is 31.66 kg/(m^2). Well nourished, well developed WM in no acute  distress HEENT: normocephalic, atraumatic Neck: no JVD, carotid bruits or masses Cardiac:  normal S1, S2; RRR; no murmurs, rubs, or gallops Lungs:  clear to auscultation bilaterally, no wheezing, rhonchi or rales Abd: soft, nontender, no hepatomegaly, + BS MS: no deformity or atrophy Ext: no edema Skin: warm and dry, no rash Neuro:  moves all extremities spontaneously, no focal abnormalities noted, follows commands Psych: euthymic mood, full affect  Recent Labs: No results found for requested labs within last 365 days.  No results found  for requested labs within last 365 days.   CrCl cannot be calculated (Patient has no serum creatinine result on file.).   Wt Readings from Last 3 Encounters:  10/03/15 260 lb (117.935 kg)  09/04/15 257 lb (116.574 kg)  08/29/15 257 lb (116.574 kg)     Other studies reviewed: Additional studies/records reviewed today include: summarized above  ASSESSMENT AND PLAN:  1. CAD with history of atypical CP - suspect atypical CP is musculoskeletal/neuropathic in etiology. Recent stress test was low risk. Continue current medical regimen. He has been sedentary lately - I encouraged him to increase physical activity and monitor for further symptoms. 2.  Essential HTN - controlled. 3. Dyslipidemia - continue statin for secondary prevention. It's not clear if his joint discomfort is due to underlying cartilage issues or may be related to his high dose statin. I told him he may try cutting his Lipitor in half to see if he observes any difference. I also told him that some patients find improvement in statin tolerance by taking CoQ-10 100mg -200mg  daily. 4. Bilateral carotid artery disease - he reports more recently this was checked by PCP in 05/2015 showing 1-39% on the R, no significant disease on the left. F/u per primary care. 5. Sinus bradycardia - HR remains stable. No symptoms related to this.  Disposition: F/u with Dr. Mare Ferrari in early 2017 - the patient  has f/u in January but wants to move this back to February since he was seen this fall.  Current medicines are reviewed at length with the patient today.  The patient did not have any concerns regarding medicines.  Raechel Ache PA-C 10/03/2015 8:21 AM     [facility] [address] [place] [address] [place] [address] [phone] (office)  [phone] (fax)

## 2015-10-03 ENCOUNTER — Ambulatory Visit (INDEPENDENT_AMBULATORY_CARE_PROVIDER_SITE_OTHER): Payer: Medicare Other | Admitting: Physician Assistant

## 2015-10-03 ENCOUNTER — Ambulatory Visit: Payer: Medicare Other | Admitting: Cardiology

## 2015-10-03 ENCOUNTER — Encounter: Payer: Self-pay | Admitting: Physician Assistant

## 2015-10-03 VITALS — BP 122/70 | HR 62 | Ht 76.0 in | Wt 260.0 lb

## 2015-10-03 DIAGNOSIS — I739 Peripheral vascular disease, unspecified: Secondary | ICD-10-CM

## 2015-10-03 DIAGNOSIS — R001 Bradycardia, unspecified: Secondary | ICD-10-CM

## 2015-10-03 DIAGNOSIS — I259 Chronic ischemic heart disease, unspecified: Secondary | ICD-10-CM

## 2015-10-03 DIAGNOSIS — I779 Disorder of arteries and arterioles, unspecified: Secondary | ICD-10-CM

## 2015-10-03 DIAGNOSIS — E785 Hyperlipidemia, unspecified: Secondary | ICD-10-CM

## 2015-10-03 DIAGNOSIS — I10 Essential (primary) hypertension: Secondary | ICD-10-CM

## 2015-10-03 DIAGNOSIS — Z9861 Coronary angioplasty status: Secondary | ICD-10-CM

## 2015-10-03 DIAGNOSIS — I2510 Atherosclerotic heart disease of native coronary artery without angina pectoris: Secondary | ICD-10-CM | POA: Diagnosis not present

## 2015-10-03 MED ORDER — ATORVASTATIN CALCIUM 80 MG PO TABS: 40.0000 mg | ORAL_TABLET | Freq: Every day | ORAL | Status: DC

## 2015-10-03 NOTE — Patient Instructions (Signed)
Medication Instructions:  1) You may try cutting your Lipitor in half and see if that helps with joint pain.    Labwork: None  Testing/Procedures: None  Follow-Up: Your physician recommends that you schedule a follow-up appointment in: February 2017 with Dr. Mare Ferrari.  Any Other Special Instructions Will Be Listed Below (If Applicable).     If you need a refill on your cardiac medications before your next appointment, please call your pharmacy.

## 2015-10-11 DIAGNOSIS — M7671 Peroneal tendinitis, right leg: Secondary | ICD-10-CM | POA: Diagnosis not present

## 2015-10-11 DIAGNOSIS — S93401A Sprain of unspecified ligament of right ankle, initial encounter: Secondary | ICD-10-CM | POA: Diagnosis not present

## 2015-10-11 DIAGNOSIS — M19071 Primary osteoarthritis, right ankle and foot: Secondary | ICD-10-CM | POA: Diagnosis not present

## 2015-11-21 DIAGNOSIS — S93401D Sprain of unspecified ligament of right ankle, subsequent encounter: Secondary | ICD-10-CM | POA: Diagnosis not present

## 2015-11-21 DIAGNOSIS — M7671 Peroneal tendinitis, right leg: Secondary | ICD-10-CM | POA: Diagnosis not present

## 2015-11-21 DIAGNOSIS — M19071 Primary osteoarthritis, right ankle and foot: Secondary | ICD-10-CM | POA: Diagnosis not present

## 2015-11-21 DIAGNOSIS — M722 Plantar fascial fibromatosis: Secondary | ICD-10-CM | POA: Diagnosis not present

## 2015-12-13 ENCOUNTER — Ambulatory Visit: Payer: Medicare Other | Admitting: Cardiology

## 2015-12-14 ENCOUNTER — Other Ambulatory Visit: Payer: Self-pay | Admitting: Cardiology

## 2016-01-28 ENCOUNTER — Ambulatory Visit (INDEPENDENT_AMBULATORY_CARE_PROVIDER_SITE_OTHER): Payer: Medicare Other | Admitting: Cardiology

## 2016-01-28 ENCOUNTER — Encounter: Payer: Self-pay | Admitting: Cardiology

## 2016-01-28 VITALS — BP 110/70 | HR 64 | Ht 76.0 in | Wt 249.0 lb

## 2016-01-28 DIAGNOSIS — I10 Essential (primary) hypertension: Secondary | ICD-10-CM | POA: Diagnosis not present

## 2016-01-28 DIAGNOSIS — R001 Bradycardia, unspecified: Secondary | ICD-10-CM | POA: Diagnosis not present

## 2016-01-28 DIAGNOSIS — I259 Chronic ischemic heart disease, unspecified: Secondary | ICD-10-CM | POA: Diagnosis not present

## 2016-01-28 NOTE — Progress Notes (Signed)
Cardiology Office Note   Date:  01/28/2016   ID:  Clark, Jared [birth year], MRN [id number]  PCP:  Marton Redwood, MD  Cardiologist: Darlin Coco MD  Chief Complaint  Patient presents with  . Follow-up      History of Present Illness: Jared Clark is a 80 y.o. 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 Jared Clark He has a history of known ischemic heart disease. In September of 2003 he underwent stenting of his first obtuse marginal vessel. He had a repeat cardiac catheterization in March 2010 after an abnormal stress Cardiolite. The cardiac catheterization showed long term patency of the prior stent and nonobstructive atherosclerotic disease elsewhere and he had normal left ventricular function. He had a nuclear stress test on 11/13/10 and showed no ischemia and fair exercise capacity with a hypertensive blood pressure response and his ejection fraction was 58%. We saw him as a work in on 01/25/14 because of recurring chest pain. We scheduled him for a walking LexiScan Myoview. This showed significant reversible anterolateral ischemia. On 02/10/14 the patient underwent cardiac catheterization with successful stenting of the second diagonal with a drug-eluting stent with Dr. Martinique. He will need to be on dual antiplatelet therapy for one year at least. His Lipitor was also increased to 80 mg a day for aggressive lipid-lowering risk modification. He has subsequently reduce the dose of his atorvastatin to just 40 mg daily. He was seen in September 2016 complaining of atypical chest discomfort Because of his atypical chest pain the patient underwent a myocardial perfusion scan on 09/04/15 it demonstrated the following:  Nuclear stress EF: 62%.  There was no ST segment deviation noted during stress.  Defect 1: There is a medium defect of severe severity present in the basal inferior, basal  inferolateral and mid inferior location. Consistent with diaphragmatic attenuation and improves with stress.  The study is normal.  This is a low risk study.  The left ventricular ejection fraction is normal (55-65%).  Since last visit the patient has not had any further significant chest discomfort.  He continues to have some atypical musculoskeletal left-sided chest wall pain. He is making a good effort to lose weight again.  His weight is down 11 pounds since last visit.  We agreed that a good target weight for him would be 210 or 215 pounds. The patient is not currently a member of a exercise club.  He is considering joining one of the nearby facilities.  His health insurance will support a Silver sne[employer] approach.  Past Medical History  Diagnosis Date  . Sinus bradycardia   . Dyslipidemia     History of dyslipidemia  . Mild depression   . BPH (benign prostatic hypertrophy)     S/P TURP 1995  . Hypertension   . Reflux OCCASIONAL    TAKES PRILOSEC  . Osteoarthritis     RIGHT ANKLE, BILATERAL WRIST, BACK  . Borderline diabetes     DIET CONTROLLED  . Cataract of right eye   . Coronary artery disease     a. 2003 s/p PCI/stenting; b. 02/2014 Abnl nuc -> Cath/PCI: LM nl, LAD nl, D2 95p (2.5x12 Promus DES), LCX <20, OM1 patent stent, RCA 50d, EF 55-65%. c. 08/2015: normal nuc.  Jared Clark GERD (gastroesophageal reflux disease)   . Carotid artery disease ([place])     a. Carotid duplex in 05/2013: 123456 RICA, [id number] LICA. b. Per Patient,  doppler 05/2015 showed 1-39% on the R - being folloewd by primary care.    Past Surgical History  Procedure Laterality Date  . Lumbar laminectomy  1973    L4-S1  . Shoulder arthroscopy      Bilateral  . Coronary angioplasty  2003- FIRST OBTUSE MARGIANL VESSEL     X1 CYPHER STENT  . Cardiac catheterization  02-06-09    NO INTERVENTION  . Tonsillectomy and adenoidectomy  CHILD  . Cholecystectomy  123XX123    W/ UMBILICAL HERNIA REPAIR  . Hernia repair  123XX123     Umbilical  . Inguinal hernia repair  1985  . Transurethral resection of prostate  1995  . Cervical discectomy  1989    C4 - 5  . Pilonidal cyst excision  1961  . Knee arthroscopy  11/05/2011    Procedure: ARTHROSCOPY KNEE;  Surgeon: Gearlean Alf;  Location: [facility];  Service: Orthopedics;  Laterality: Right;  RIGHT ARTHROSCOPY KNEE WITH DEBRIDEMENT, Chondroplasty  . Carotid stent    . Coronary stent placement      DES to first diagonal        DR Jared Clark  . Left heart catheterization with coronary angiogram N/A 02/10/2014    Procedure: LEFT HEART CATHETERIZATION WITH CORONARY ANGIOGRAM;  Surgeon: Jared M Martinique, MD;  Location: [facility] CATH LAB;  Service: Cardiovascular;  Laterality: N/A;     Current Outpatient Prescriptions  Medication Sig Dispense Refill  . aspirin 81 MG tablet Take 81 mg by mouth daily.     Jared Clark atorvastatin (LIPITOR) 80 MG tablet Take 0.5 tablets (40 mg total) by mouth at bedtime. 90 tablet 3  . bismuth subsalicylate (KAOPECTATE) 262 MG/15ML suspension Take 30 mLs by mouth every 6 (six) hours as needed for indigestion or diarrhea or loose stools.    . calcium carbonate (TUMS - DOSED IN MG ELEMENTAL CALCIUM) 500 MG chewable tablet Chew 1 tablet by mouth daily as needed for indigestion or heartburn.    . citalopram (CELEXA) 20 MG tablet Take 20 mg by mouth daily.     . clopidogrel (PLAVIX) 75 MG tablet Take 75 mg by mouth daily.     . finasteride (PROSCAR) 5 MG tablet Take 5 mg by mouth 2 (two) times a week.     . loperamide (IMODIUM) 2 MG capsule Take 2 mg by mouth as needed for diarrhea or loose stools.    Jared Clark losartan (COZAAR) 25 MG tablet Take 1 tablet by mouth  daily 90 tablet 2  . metoprolol succinate (TOPROL-XL) 25 MG 24 hr tablet Take 12.5 mg by mouth as directed. 1/2 tablet daily    . Multiple Vitamins-Minerals (MULTIVITAMIN PO) Take 1 tablet by mouth daily.    . nitroGLYCERIN (NITROSTAT) 0.4 MG SL tablet Place 0.4 mg under the tongue every 5 (five)  minutes as needed for chest pain (x 3 tablets daily).    . Probiotic Product (PROBIOTIC DAILY PO) Take 1 capsule by mouth at bedtime.    . valACYclovir (VALTREX) 1000 MG tablet Take 1,000 mg by mouth as needed. Outbreak.    . vitamin C (ASCORBIC ACID) 500 MG tablet Take 500 mg by mouth daily.    Jared Clark zolpidem (AMBIEN) 5 MG tablet Patient take 1/2 tablet by mouth as needed for sleep     No current facility-administered medications for this visit.    Allergies:   Flexeril; Methocarbamol; Orphenadrine citrate; and Ramipril    Social History:  The patient  reports that he has  never smoked. He has never used smokeless tobacco. He reports that he drinks alcohol. He reports that he does not use illicit drugs.   Family History:  The patient's family history includes Hypertension in his mother; Lung cancer in his father. There is no history of Heart attack or Stroke.    ROS:  Please see the history of present illness.   Otherwise, review of systems are positive for none.   All other systems are reviewed and negative.    PHYSICAL EXAM: VS:  BP 110/70 mmHg  Pulse 64  Ht 6\' 4"  (1.93 m)  Wt 249 lb (112.946 kg)  BMI 30.32 kg/m2 , BMI Body mass index is 30.32 kg/(m^2). GEN: Well nourished, well developed, in no acute distress HEENT: normal Neck: no JVD, carotid bruits, or masses Cardiac: RRR; no murmurs, rubs, or gallops,no edema  Respiratory:  clear to auscultation bilaterally, normal work of breathing GI: soft, nontender, nondistended, + BS MS: no deformity or atrophy Skin: warm and dry, no rash Neuro:  Strength and sensation are intact Psych: euthymic mood, full affect   EKG:  EKG is ordered today. The ekg ordered today demonstrates sinus bradycardia.  Incomplete right bundle branch block.  Left ventricular hypertrophy with repolarization abnormality.  Since previous tracing of 08/29/15, no significant change   Recent Labs: No results found for requested labs within last 365 days.     Lipid Panel    Component Value Date/Time   CHOL 137 09/15/2011 0950   TRIG 77.0 09/15/2011 0950   HDL 47.30 09/15/2011 0950   CHOLHDL 3 09/15/2011 0950   VLDL 15.4 09/15/2011 0950   LDLCALC 74 09/15/2011 0950      Wt Readings from Last 3 Encounters:  01/28/16 249 lb (112.946 kg)  10/03/15 260 lb (117.935 kg)  09/04/15 257 lb (116.574 kg)        ASSESSMENT AND PLAN:   CAD with stent to his first obtuse marginal in 2003.  Cardiac catheterization on 2010 showed that the stent was patent.  In 2015 the patient had an abnormal nuclear stress test and went on to have cardiac catheterization on 02/10/14 and underwent drug-eluting stent to his second diagonal by Dr. Martinique.  The patient had a Myoview stress test for atypical chest pain on 09/04/15 which was negative for ischemia  Essential HTN - controlled.  Dyslipidemia - continue statin for secondary prevention.  Bilateral carotid artery disease - he reports more recently this was checked by PCP Dr. Lang Snow in 05/2015 showing 1-39% on the R, no significant disease on the left. F/u per primary care.  Sinus bradycardia - HR stable.   Current medicines are reviewed at length with the patient today.  The patient does not have concerns regarding medicines.  The following changes have been made:  no change  Labs/ tests ordered today include:   Orders Placed This Encounter  Procedures  . EKG 12-Lead     Disposition:   Following my retirement the patient will follow-up with Dr. Peter Jared Clark in October 2017 at [facility] office.  Continue current medication.  Continue efforts to lose weight and eventually down to 210-215 pounds  Signed, Darlin Coco MD 01/28/2016 2:46 PM    [address], [place], [place]  [address] Phone: [phone]; Fax: [phone]

## 2016-01-28 NOTE — Patient Instructions (Signed)
Medication Instructions:  Your physician recommends that you continue on your current medications as directed. Please refer to the Current Medication list given to you today.  Labwork: none  Testing/Procedures: none  Follow-Up: Your physician wants you to follow-up in: October with Dr Martinique at the [facility] office  You will receive a reminder letter in the mail two months in advance. If you don't receive a letter, please call our office to schedule the follow-up appointment.  If you need a refill on your cardiac medications before your next appointment, please call your pharmacy.

## 2016-01-30 ENCOUNTER — Ambulatory Visit: Payer: Medicare Other | Admitting: Cardiology

## 2016-02-05 ENCOUNTER — Encounter: Payer: Self-pay | Admitting: Cardiology

## 2016-05-14 DIAGNOSIS — H52203 Unspecified astigmatism, bilateral: Secondary | ICD-10-CM | POA: Diagnosis not present

## 2016-05-14 DIAGNOSIS — H26491 Other secondary cataract, right eye: Secondary | ICD-10-CM | POA: Diagnosis not present

## 2016-05-21 ENCOUNTER — Telehealth: Payer: Self-pay | Admitting: Cardiology

## 2016-05-21 ENCOUNTER — Ambulatory Visit (INDEPENDENT_AMBULATORY_CARE_PROVIDER_SITE_OTHER): Payer: Medicare Other | Admitting: Nurse Practitioner

## 2016-05-21 ENCOUNTER — Other Ambulatory Visit: Payer: Self-pay | Admitting: Nurse Practitioner

## 2016-05-21 ENCOUNTER — Ambulatory Visit: Payer: Medicare Other | Admitting: Nurse Practitioner

## 2016-05-21 ENCOUNTER — Encounter: Payer: Self-pay | Admitting: Nurse Practitioner

## 2016-05-21 ENCOUNTER — Other Ambulatory Visit (HOSPITAL_COMMUNITY)
Admission: RE | Admit: 2016-05-21 | Discharge: 2016-05-21 | Disposition: A | Discharge status: Home / Self Care | Payer: Medicare Other | Source: Ambulatory Visit | Attending: Vascular Surgery | Admitting: Vascular Surgery

## 2016-05-21 VITALS — BP 160/80 | HR 86 | Ht 76.0 in | Wt 258.0 lb

## 2016-05-21 DIAGNOSIS — E785 Hyperlipidemia, unspecified: Secondary | ICD-10-CM | POA: Diagnosis not present

## 2016-05-21 DIAGNOSIS — I10 Essential (primary) hypertension: Secondary | ICD-10-CM

## 2016-05-21 DIAGNOSIS — R001 Bradycardia, unspecified: Secondary | ICD-10-CM

## 2016-05-21 DIAGNOSIS — I259 Chronic ischemic heart disease, unspecified: Secondary | ICD-10-CM | POA: Diagnosis not present

## 2016-05-21 DIAGNOSIS — R0789 Other chest pain: Secondary | ICD-10-CM

## 2016-05-21 LAB — BASIC METABOLIC PANEL
Anion gap: 6 (ref 5–15)
BUN: 18 mg/dL (ref 6–20)
CO2: 22 mmol/L (ref 22–32)
Calcium: 10.1 mg/dL (ref 8.9–10.3)
Chloride: 109 mmol/L (ref 101–111)
Creatinine, Ser: 0.78 mg/dL (ref 0.61–1.24)
Glucose, Bld: 99 mg/dL (ref 65–99)
POTASSIUM: 4.1 mmol/L (ref 3.5–5.1)
SODIUM: 137 mmol/L (ref 135–145)

## 2016-05-21 LAB — CBC
HEMATOCRIT: 43.7 % (ref 39.0–52.0)
Hemoglobin: 14.7 g/dL (ref 13.0–17.0)
MCH: 30.4 pg (ref 26.0–34.0)
MCHC: 33.6 g/dL (ref 30.0–36.0)
MCV: 90.5 fL (ref 78.0–100.0)
PLATELETS: 200 10*3/uL (ref 150–400)
RBC: 4.83 MIL/uL (ref 4.22–5.81)
RDW: 13.7 % (ref 11.5–15.5)
WBC: 5.7 10*3/uL (ref 4.0–10.5)

## 2016-05-21 LAB — PROTIME-INR
INR: 1.06 (ref 0.00–1.49)
Prothrombin Time: 14 seconds (ref 11.6–15.2)

## 2016-05-21 LAB — APTT: APTT: 27 s (ref 24–37)

## 2016-05-21 NOTE — Telephone Encounter (Signed)
Pt c/o of Chest Pain: STAT if CP now or developed within 24 hours  1. Are you having CP right now? no  2. Are you experiencing any other symptoms (ex. SOB, nausea, vomiting, sweating)? no  3. How long have you been experiencing CP? Last night- left side of chest.    4. Is your CP continuous or coming and going? Pain occurred, gradually declined over 15 minutes, some discomfort left today-   5. Have you taken Nitroglycerin? Yes  ?

## 2016-05-21 NOTE — Progress Notes (Addendum)
CARDIOLOGY OFFICE NOTE  Date:  05/21/2016    Jared Clark Date of Birth: [birth year] Medical Record [id number]  PCP:  Marton Redwood, MD  Cardiologist:  Former patient of Dr. Sherryl Barters - to establish with  Dr. Martinique going forward.    Chief Complaint  Patient presents with  . Chest Pain  . Coronary Artery Disease  . Hyperlipidemia  . Hypertension    Work in visit - former patient of Dr. Sherryl Barters - to establish with Dr. Martinique    History of Present Illness: Jared Clark is a 80 y.o. male who presents today for a work in visit. Former patient of Dr. Sherryl Barters. He is to establish with Dr. Martinique going forward.  He has known CAD. In September of 2003 he underwent stenting of his first obtuse marginal vessel. He had a repeat cardiac catheterization in March 2010 after an abnormal stress Cardiolite. The cardiac catheterization showed long term patency of the prior stent and nonobstructive atherosclerotic disease elsewhere and he had normal left ventricular function. Seen as a work in on 01/25/14 because of recurring chest pain. He had LexiScan Myoview. This showed significant reversible anterolateral ischemia. On 02/10/14 the patient underwent cardiac catheterization with successful stenting of the second diagonal with a drug-eluting stent with Dr. Martinique. He was seen in September 2016 complaining of atypical chest discomfort. Because of his atypical chest pain the patient underwent a myocardial perfusion scan 08/2015 which showed no ischemia and normal EF. It was felt to be a low risk study.  Other issues include HTN, HLD, GERD, carotid disease and obesity.   He was last seen in February and felt to be doing ok.  Phone call today - I spoke to patient. Fmr pt of Dr. Mare Ferrari, set up to establish with Dr. Martinique in fall (no current future visit on file). Pt is retired [employer]. Pt explains he has history of stents and atypical angina. Typically his anginal discomfort is lateral to  breast. Noted he did a little bit of lifting yesterday. He does this on occasion, and is familiar w/ chostocondritis, notes usual improvement w sternal massage.He had some soreness from lifting yesterday. Went to bed around 10:30 but awoke at about 11:45pm and noted he hates to use the word "severe" or "pain", but had as he rated a 7/10 pain in left chest.  He took NTG x1 and the pain resolved after several minutes. He took a Tums to see if additional relief, noted this may have helped also.  Pt denies SOB, dizziness, radiating jaw or arm pain w/ occurrence last night.  Pt asking to be seen in office. Pt sched to see Truitt Merle today at 3pm.  I have given pt instruction to go to ED today instead if he has a return of his symptoms or any SOB, dizziness. Pt appreciative of advice given, and voiced understanding of recommendations and appt information.  Thus added to my schedule today.  Comes back today. Here alone. He notes that this spell last night - pretty reminiscent of his prior chest pain syndrome. NTG helped. It lasted about 10 minutes. He has noted more fatigue over the past several months - not really clear as to why he has. He does some exercise - chair yoga and chair volleyball. He noted that Friday night while at the [place], he walked up a very small incline and had to stop and rest. Wife feels like he tires more easily.   Past Medical History  Diagnosis Date  . Sinus bradycardia   . Dyslipidemia     History of dyslipidemia  . Mild depression   . BPH (benign prostatic hypertrophy)     S/P TURP 1995  . Hypertension   . Reflux OCCASIONAL    TAKES PRILOSEC  . Osteoarthritis     RIGHT ANKLE, BILATERAL WRIST, BACK  . Borderline diabetes     DIET CONTROLLED  . Cataract of right eye   . Coronary artery disease     a. 2003 s/p PCI/stenting; b. 02/2014 Abnl nuc -> Cath/PCI: LM nl, LAD nl, D2 95p (2.5x12 Promus DES), LCX <20, OM1 patent stent, RCA 50d, EF 55-65%. c. 08/2015: normal nuc.  [name]  GERD (gastroesophageal reflux disease)   . Carotid artery disease ([place])     a. Carotid duplex in 05/2013: [id number] RICA, [id number] LICA. b. Per Patient, doppler 05/2015 showed 1-39% on the R - being folloewd by primary care.    Past Surgical History  Procedure Laterality Date  . Lumbar laminectomy  1973    L4-S1  . Shoulder arthroscopy      Bilateral  . Coronary angioplasty  2003- FIRST OBTUSE MARGIANL VESSEL     X1 CYPHER STENT  . Cardiac catheterization  02-06-09    NO INTERVENTION  . Tonsillectomy and adenoidectomy  CHILD  . Cholecystectomy  123XX123    W/ UMBILICAL HERNIA REPAIR  . Hernia repair  123XX123    Umbilical  . Inguinal hernia repair  1985  . Transurethral resection of prostate  1995  . Cervical discectomy  1989    C4 - 5  . Pilonidal cyst excision  1961  . Knee arthroscopy  11/05/2011    Procedure: ARTHROSCOPY KNEE;  Surgeon: Gearlean Alf;  Location: [place];  Service: Orthopedics;  Laterality: Right;  RIGHT ARTHROSCOPY KNEE WITH DEBRIDEMENT, Chondroplasty  . Carotid stent    . Coronary stent placement      DES to first diagonal        DR Martinique  . Left heart catheterization with coronary angiogram N/A 02/10/2014    Procedure: LEFT HEART CATHETERIZATION WITH CORONARY ANGIOGRAM;  Surgeon: Peter M Martinique, MD;  Location: [facility] CATH LAB;  Service: Cardiovascular;  Laterality: N/A;     Medications: Current Outpatient Prescriptions  Medication Sig Dispense Refill  . aspirin 81 MG tablet Take 81 mg by mouth daily.     [name] atorvastatin (LIPITOR) 80 MG tablet Take 0.5 tablets (40 mg total) by mouth at bedtime. 90 tablet 3  . bismuth subsalicylate (KAOPECTATE) 262 MG/15ML suspension Take 30 mLs by mouth every 6 (six) hours as needed for indigestion or diarrhea or loose stools.    . calcium carbonate (TUMS - DOSED IN MG ELEMENTAL CALCIUM) 500 MG chewable tablet Chew 1 tablet by mouth daily as needed for indigestion or heartburn.    . clopidogrel (PLAVIX) 75 MG tablet Take  75 mg by mouth daily.     . finasteride (PROSCAR) 5 MG tablet Take 5 mg by mouth 2 (two) times a week.     . loperamide (IMODIUM) 2 MG capsule Take 2 mg by mouth as needed for diarrhea or loose stools.    [name] losartan (COZAAR) 25 MG tablet Take 1 tablet by mouth  daily 90 tablet 2  . metoprolol succinate (TOPROL-XL) 25 MG 24 hr tablet Take 12.5 mg by mouth as directed. 1/2 tablet daily    . Multiple Vitamins-Minerals (MULTIVITAMIN PO) Take 1 tablet by mouth daily.    [name]  nitroGLYCERIN (NITROSTAT) 0.4 MG SL tablet Place 0.4 mg under the tongue every 5 (five) minutes as needed for chest pain (x 3 tablets daily).    . valACYclovir (VALTREX) 1000 MG tablet Take 1,000 mg by mouth as needed. Outbreak.    . vitamin C (ASCORBIC ACID) 500 MG tablet Take 500 mg by mouth daily.    [name] zolpidem (AMBIEN) 5 MG tablet Patient take 1/2 tablet by mouth as needed for sleep     No current facility-administered medications for this visit.    Allergies: Allergies  Allergen Reactions  . Flexeril [Cyclobenzaprine] Swelling  . Methocarbamol Swelling  . Orphenadrine Citrate Swelling  . Ramipril     DRY COUGH    Social History: The patient  reports that he has never smoked. He has never used smokeless tobacco. He reports that he drinks alcohol. He reports that he does not use illicit drugs.   Family History: The patient's family history includes Hypertension in his mother; Lung cancer in his father. There is no history of Heart attack or Stroke.   Review of Systems: Please see the history of present illness.   Otherwise, the review of systems is positive for none.   All other systems are reviewed and negative.   Physical Exam: VS:  BP 160/80 mmHg  Pulse 86  Ht 6\' 4"  (1.93 m)  Wt 258 lb (117.028 kg)  BMI 31.42 kg/m2 .  BMI Body mass index is 31.42 kg/(m^2).  Wt Readings from Last 3 Encounters:  05/21/16 258 lb (117.028 kg)  01/28/16 249 lb (112.946 kg)  10/03/15 260 lb (117.935 kg)    General: Pleasant.  Well developed, well nourished and in no acute distress. He has gained weight from last visit.  HEENT: Normal. Neck: Supple, no JVD, carotid bruits, or masses noted.  Cardiac: Regular rate and rhythm. No murmurs, rubs, or gallops. No edema.  Respiratory:  Lungs are clear to auscultation bilaterally with normal work of breathing.  GI: Soft and nontender.  MS: No deformity or atrophy. Gait and ROM intact. Skin: Warm and dry. Color is normal.  Neuro:  Strength and sensation are intact and no gross focal deficits noted.  Psych: Alert, appropriate and with normal affect.   LABORATORY DATA:  EKG:  EKG is ordered today. This demonstrates NSR he has lateral ST and T wave changes - unchanged - reviewed with Dr. Burt Knack.  Lab Results  Component Value Date   WBC 6.6 02/11/2014   HGB 14.4 02/11/2014   HCT 40.9 02/11/2014   PLT 185 02/11/2014   GLUCOSE 93 02/11/2014   CHOL 137 09/15/2011   TRIG 77.0 09/15/2011   HDL 47.30 09/15/2011   LDLCALC 74 09/15/2011   ALT 17 03/17/2011   AST 18 03/17/2011   NA 137 02/11/2014   K 4.4 02/11/2014   CL 103 02/11/2014   CREATININE 0.83 02/11/2014   BUN 13 02/11/2014   CO2 23 02/11/2014   INR 1.1* 02/07/2014    BNP (last 3 results) No results for input(s): BNP in the last 8760 hours.  ProBNP (last 3 results) No results for input(s): PROBNP in the last 8760 hours.   Other Studies Reviewed Today:  Cardiac Cath Final Conclusions:  1. Single vessel obstructive CAD involving the first diagonal. The old stent in the OM is patent. 2. Normal LV function. 3. Successful stenting of the first diagonal with DES.   Recommendations:  Continue dual antiplatelet therapy with ASA, Plavix for at least a year. Will increase statin  dose. Anticipate DC in am with follow up with Dr. Mare Ferrari.  [name] [facility] 02/10/2014, 8:46 AM   Myoview Study Highlights from 08/2015     Nuclear stress EF: 62%.  There was no ST segment deviation noted during  stress.  Defect 1: There is a medium defect of severe severity present in the basal inferior, basal inferolateral and mid inferior location. Consistent with diaphragmatic attenuation and improves with stress.  The study is normal.  This is a low risk study.  The left ventricular ejection fraction is normal (55-65%).        Assessment/Plan:  1. CAD with first stent in the 1st OM back in 2003; DES to 2nd DX in March 2015 by Dr. Martinique. Negative Myoview from 08/2015 - now with a recurrent spell of angina - also with more fatigue - reminiscent of prior chest pain syndrome. Discussed option of adding nitrates and following versus cardiac cath. His stress test was just 8 months ago. Discussed with Dr. Burt Knack. Dr. Joneen Caraway would like to proceed on with cardiac cath. The patient understands that risks include but are not limited to stroke (1 in 1000), death (1 in 12), kidney failure [usually temporary] (1 in 500), bleeding (1 in 200), allergic reaction [possibly serious] (1 in 200), and agrees to proceed.   2. HTN - BP is up here today - I think this is more related to the nature of the visit. Would follow for now.   3. HLD - on lower dose of statin  4. Carotid disease   5. Obesity/deconditioning  Current medicines are reviewed with the patient today.  The patient does not have concerns regarding medicines other than what has been noted above.  The following changes have been made:  See above.  Labs/ tests ordered today include:    Orders Placed This Encounter  Procedures  . Basic metabolic panel  . CBC  . Protime-INR  . APTT  . EKG 12-Lead     Disposition:   Further disposition to follow.   Patient is agreeable to this plan and will call if any problems develop in the interim.   Signed: Burtis Junes, RN, ANP-C 05/21/2016 10:24 AM  [facility] [address] [place], [place]  [address] Phone: [phone] Fax: [phone]

## 2016-05-21 NOTE — Patient Instructions (Addendum)
We will be checking the following labs today - STAT BMET, CBC, PT, PTT   Medication Instructions:    Continue with your current medicines.     Testing/Procedures To Be Arranged:  Cardiac Catheterization  Your provider has recommended a cardiac catherization  You are scheduled for a cardiac catheterization tomorrow June 15th at 12 noon with Dr. Martinique or associate.  Go to [facility] 2nd Floor Short Stay tomorrow, June 15th at [place] thru the [employer] entrance A No food or drink after midnight tonight. You may take your medications with a sip of water on the day of your procedure.   Coronary Angiogram A coronary angiogram, also called coronary angiography, is an X-ray procedure used to look at the arteries in the heart. In this procedure, a dye (contrast dye) is injected through a long, hollow tube (catheter). The catheter is about the size of a piece of cooked spaghetti and is inserted through your groin, wrist, or arm. The dye is injected into each artery, and X-rays are then taken to show if there is a blockage in the arteries of your heart.  LET [facility] CARE PROVIDER KNOW ABOUT: Any allergies you have, including allergies to shellfish or contrast dye.  All medicines you are taking, including vitamins, herbs, eye drops, creams, and over-the-counter medicines.  Previous problems you or members of your family have had with the use of anesthetics.  Any blood disorders you have.  Previous surgeries you have had. History of kidney problems or failure.  Other medical conditions you have.  RISKS AND COMPLICATIONS  Generally, a coronary angiogram is a safe procedure. However, about 1 person out of 1000 can have problems that may include: Allergic reaction to the dye. Bleeding/bruising from the access site or other locations. Kidney injury, especially in people with impaired kidney function. Stroke (rare). Heart attack (rare). Irregular rhythms (rare) Death  (rare)  BEFORE THE PROCEDURE  Do not eat or drink anything after midnight the night before the procedure or as directed by your health care provider.  Ask your health care provider about changing or stopping your regular medicines. This is especially important if you are taking diabetes medicines or blood thinners.  PROCEDURE You may be given a medicine to help you relax (sedative) before the procedure. This medicine is given through an intravenous (IV) access tube that is inserted into one of your veins.  The area where the catheter will be inserted will be washed and shaved. This is usually done in the groin but may be done in the fold of your arm (near your elbow) or in the wrist.  A medicine will be given to numb the area where the catheter will be inserted (local anesthetic).  The health care provider will insert the catheter into an artery. The catheter will be guided by using a special type of X-ray (fluoroscopy) of the blood vessel being examined.  A special dye will then be injected into the catheter, and X-rays will be taken. The dye will help to show where any narrowing or blockages are located in the heart arteries.    AFTER THE PROCEDURE  If the procedure is done through the leg, you will be kept in bed lying flat for several hours. You will be instructed to not bend or cross your legs. The insertion site will be checked frequently.  The pulse in your feet or wrist will be checked frequently.  Additional blood tests, X-rays, and an electrocardiogram may be done.  Other Special Instructions:   N/A    If you need a refill on your cardiac medications before your next appointment, please call your pharmacy.   Call the [place] office at [phone] if you have any questions, problems or concerns.

## 2016-05-21 NOTE — Telephone Encounter (Signed)
I spoke to patient.  Fmr pt of Dr. Mare Ferrari, set up to establish w Dr. Martinique in fall (no current future visit on file). Pt is retired [employer].  Pt explains he has history of stents and atypical angina.  Typically has anginal discomfort lateral to breast.  Noted he did a little bit of lifting yesterday. He does this on occasion, and is familiar w/ chostocondritis, notes usual improvement w sternal massage.  He had some soreness from lifting yesterday. Went to bed around 11:45pm and noted he hates to use the word "severe" or "pain", but had as he rated a 7/10 pain in left chest.  He took NTG x1 and the pain resolved after several minutes. He took a Tums to see if additional relief, noted this may have helped also. Pt denies SOB, dizziness, radiating jaw or arm pain w/ occurrence last night.  Pt asking to be seen in office. Pt sched to see Truitt Merle today at 3pm. I have given pt instruction to go to ED today instead if he has a return of his symptoms or any SOB, dizziness. Pt appreciative of advice given, and voiced understanding of recommendations and appt information.

## 2016-05-22 ENCOUNTER — Ambulatory Visit (HOSPITAL_COMMUNITY)
Admission: RE | Admit: 2016-05-22 | Discharge: 2016-05-22 | Disposition: A | Discharge status: Home / Self Care | Payer: Medicare Other | Source: Ambulatory Visit | Attending: Cardiology | Admitting: Cardiology

## 2016-05-22 ENCOUNTER — Encounter (HOSPITAL_COMMUNITY)
Admission: RE | Disposition: A | Discharge status: Home / Self Care | Payer: Self-pay | Source: Ambulatory Visit | Attending: Cardiology

## 2016-05-22 DIAGNOSIS — K219 Gastro-esophageal reflux disease without esophagitis: Secondary | ICD-10-CM | POA: Diagnosis not present

## 2016-05-22 DIAGNOSIS — I2510 Atherosclerotic heart disease of native coronary artery without angina pectoris: Secondary | ICD-10-CM | POA: Insufficient documentation

## 2016-05-22 DIAGNOSIS — Z7982 Long term (current) use of aspirin: Secondary | ICD-10-CM | POA: Diagnosis not present

## 2016-05-22 DIAGNOSIS — R7303 Prediabetes: Secondary | ICD-10-CM | POA: Diagnosis not present

## 2016-05-22 DIAGNOSIS — Z6831 Body mass index (BMI) 31.0-31.9, adult: Secondary | ICD-10-CM | POA: Insufficient documentation

## 2016-05-22 DIAGNOSIS — M1990 Unspecified osteoarthritis, unspecified site: Secondary | ICD-10-CM | POA: Diagnosis not present

## 2016-05-22 DIAGNOSIS — R079 Chest pain, unspecified: Secondary | ICD-10-CM | POA: Diagnosis present

## 2016-05-22 DIAGNOSIS — I259 Chronic ischemic heart disease, unspecified: Secondary | ICD-10-CM | POA: Diagnosis present

## 2016-05-22 DIAGNOSIS — Z8249 Family history of ischemic heart disease and other diseases of the circulatory system: Secondary | ICD-10-CM | POA: Diagnosis not present

## 2016-05-22 DIAGNOSIS — I119 Hypertensive heart disease without heart failure: Secondary | ICD-10-CM | POA: Diagnosis present

## 2016-05-22 DIAGNOSIS — E785 Hyperlipidemia, unspecified: Secondary | ICD-10-CM | POA: Diagnosis not present

## 2016-05-22 DIAGNOSIS — R0789 Other chest pain: Secondary | ICD-10-CM | POA: Diagnosis not present

## 2016-05-22 DIAGNOSIS — Z7902 Long term (current) use of antithrombotics/antiplatelets: Secondary | ICD-10-CM | POA: Diagnosis not present

## 2016-05-22 DIAGNOSIS — I10 Essential (primary) hypertension: Secondary | ICD-10-CM | POA: Diagnosis not present

## 2016-05-22 DIAGNOSIS — E669 Obesity, unspecified: Secondary | ICD-10-CM | POA: Diagnosis not present

## 2016-05-22 DIAGNOSIS — Z955 Presence of coronary angioplasty implant and graft: Secondary | ICD-10-CM | POA: Insufficient documentation

## 2016-05-22 DIAGNOSIS — N400 Enlarged prostate without lower urinary tract symptoms: Secondary | ICD-10-CM | POA: Diagnosis not present

## 2016-05-22 HISTORY — DX: Chest pain, unspecified: R07.9

## 2016-05-22 HISTORY — PX: CARDIAC CATHETERIZATION: SHX172

## 2016-05-22 SURGERY — LEFT HEART CATH AND CORONARY ANGIOGRAPHY

## 2016-05-22 MED ORDER — MIDAZOLAM HCL 2 MG/2ML IJ SOLN
INTRAMUSCULAR | Status: DC | PRN
  Administered 2016-05-22: 1 mg via INTRAVENOUS

## 2016-05-22 MED ORDER — SODIUM CHLORIDE 0.9 % WEIGHT BASED INFUSION: 3.0000 mL/kg/h | INTRAVENOUS | Status: AC

## 2016-05-22 MED ORDER — SODIUM CHLORIDE 0.9 % IV SOLN: 250.0000 mL | INTRAVENOUS | Status: DC | PRN

## 2016-05-22 MED ORDER — DIAZEPAM 5 MG PO TABS
ORAL_TABLET | ORAL | Status: AC
  Filled 2016-05-22: qty 2

## 2016-05-22 MED ORDER — FENTANYL CITRATE (PF) 100 MCG/2ML IJ SOLN
INTRAMUSCULAR | Status: DC | PRN
  Administered 2016-05-22: 25 ug via INTRAVENOUS

## 2016-05-22 MED ORDER — SODIUM CHLORIDE 0.9 % IV SOLN: INTRAVENOUS | Status: DC

## 2016-05-22 MED ORDER — FENTANYL CITRATE (PF) 100 MCG/2ML IJ SOLN
INTRAMUSCULAR | Status: AC
  Filled 2016-05-22: qty 2

## 2016-05-22 MED ORDER — HEPARIN (PORCINE) IN NACL 2-0.9 UNIT/ML-% IJ SOLN
INTRAMUSCULAR | Status: DC | PRN
  Administered 2016-05-22: 1000 mL

## 2016-05-22 MED ORDER — SODIUM CHLORIDE 0.9% FLUSH: 3.0000 mL | Freq: Two times a day (BID) | INTRAVENOUS | Status: DC

## 2016-05-22 MED ORDER — HEPARIN (PORCINE) IN NACL 2-0.9 UNIT/ML-% IJ SOLN
INTRAMUSCULAR | Status: AC
  Filled 2016-05-22: qty 1000

## 2016-05-22 MED ORDER — ASPIRIN 81 MG PO CHEW
CHEWABLE_TABLET | ORAL | Status: AC
  Administered 2016-05-22: 81 mg via ORAL
  Filled 2016-05-22: qty 1

## 2016-05-22 MED ORDER — LIDOCAINE HCL (PF) 1 % IJ SOLN
INTRAMUSCULAR | Status: AC
  Filled 2016-05-22: qty 30

## 2016-05-22 MED ORDER — HEPARIN SODIUM (PORCINE) 1000 UNIT/ML IJ SOLN
INTRAMUSCULAR | Status: DC | PRN
  Administered 2016-05-22: 6000 [IU] via INTRAVENOUS

## 2016-05-22 MED ORDER — SODIUM CHLORIDE 0.9% FLUSH: 3.0000 mL | INTRAVENOUS | Status: DC | PRN

## 2016-05-22 MED ORDER — VERAPAMIL HCL 2.5 MG/ML IV SOLN
INTRAVENOUS | Status: AC
  Filled 2016-05-22: qty 2

## 2016-05-22 MED ORDER — ASPIRIN 81 MG PO CHEW
81.0000 mg | CHEWABLE_TABLET | ORAL | Status: AC
  Administered 2016-05-22: 81 mg via ORAL

## 2016-05-22 MED ORDER — LIDOCAINE HCL (PF) 1 % IJ SOLN
INTRAMUSCULAR | Status: DC | PRN
  Administered 2016-05-22: 2 mL

## 2016-05-22 MED ORDER — VERAPAMIL HCL 2.5 MG/ML IV SOLN
INTRAVENOUS | Status: DC | PRN
  Administered 2016-05-22: 10 mL via INTRA_ARTERIAL

## 2016-05-22 MED ORDER — MIDAZOLAM HCL 2 MG/2ML IJ SOLN
INTRAMUSCULAR | Status: AC
  Filled 2016-05-22: qty 2

## 2016-05-22 MED ORDER — ASPIRIN 81 MG PO CHEW: 81.0000 mg | CHEWABLE_TABLET | ORAL | Status: DC

## 2016-05-22 MED ORDER — IOPAMIDOL (ISOVUE-370) INJECTION 76%
INTRAVENOUS | Status: DC | PRN
  Administered 2016-05-22: 80 mL via INTRA_ARTERIAL

## 2016-05-22 MED ORDER — HEPARIN SODIUM (PORCINE) 1000 UNIT/ML IJ SOLN
INTRAMUSCULAR | Status: AC
  Filled 2016-05-22: qty 1

## 2016-05-22 MED ORDER — DIAZEPAM 5 MG PO TABS
10.0000 mg | ORAL_TABLET | ORAL | Status: AC
  Administered 2016-05-22: 10 mg via ORAL

## 2016-05-22 SURGICAL SUPPLY — 11 items

## 2016-05-22 NOTE — Interval H&P Note (Signed)
History and Physical Interval Note:  05/22/2016 12:56 PM  Jared Clark  has presented today for surgery, with the diagnosis of cp  The various methods of treatment have been discussed with the patient and family. After consideration of risks, benefits and other options for treatment, the patient has consented to  Procedure(s): Left Heart Cath and Coronary Angiography (N/A) as a surgical intervention .  The patient's history has been reviewed, patient examined, no change in status, stable for surgery.  I have reviewed the patient's chart and labs.  Questions were answered to the patient's satisfaction.   Cath Lab Visit (complete for each Cath Lab visit)  Clinical Evaluation Leading to the Procedure:   ACS: No.  Non-ACS:    Anginal Classification: CCS III  Anti-ischemic medical therapy: Minimal Therapy (1 class of medications)  Non-Invasive Test Results: No non-invasive testing performed  Prior CABG: No previous CABG       Jared Clark [facility] 05/22/2016 12:57 PM

## 2016-05-22 NOTE — Research (Signed)
[facility]ERS FREE RESEARCH STUDY Informed Consent   Subject Name: Jared Clark  Subject met inclusion and exclusion criteria.  The informed consent form, study requirements and expectations were reviewed with the subject and questions and concerns were addressed prior to the signing of the consent form.  The subject verbalized understanding of the trial requirements.  The subject agreed to participate in the trial and signed the informed consent.  The informed consent was obtained prior to performance of any protocol-specific procedures for the subject.  A copy of the signed informed consent was given to the subject and a copy was placed in the subject's medical record.  Blossom Hoops 05/22/2016, 12:43 PM

## 2016-05-22 NOTE — H&P (View-Only) (Signed)
CARDIOLOGY OFFICE NOTE  Date:  05/21/2016    Jared Clark Date of Birth: 12-02-1936 Medical Record [id number]  PCP:  Marton Redwood, MD  Cardiologist:  Former patient of Dr. Sherryl Barters - to establish with  Dr. Martinique going forward.    Chief Complaint  Patient presents with  . Chest Pain  . Coronary Artery Disease  . Hyperlipidemia  . Hypertension    Work in visit - former patient of Dr. Sherryl Barters - to establish with Dr. Martinique    History of Present Illness: Jared Clark is a 80 y.o. male who presents today for a work in visit. Former patient of Dr. Sherryl Barters. He is to establish with Dr. Martinique going forward.  He has known CAD. In September of 2003 he underwent stenting of his first obtuse marginal vessel. He had a repeat cardiac catheterization in March 2010 after an abnormal stress Cardiolite. The cardiac catheterization showed long term patency of the prior stent and nonobstructive atherosclerotic disease elsewhere and he had normal left ventricular function. Seen as a work in on 01/25/14 because of recurring chest pain. He had LexiScan Myoview. This showed significant reversible anterolateral ischemia. On 02/10/14 the patient underwent cardiac catheterization with successful stenting of the second diagonal with a drug-eluting stent with Dr. Martinique. He was seen in September 2016 complaining of atypical chest discomfort. Because of his atypical chest pain the patient underwent a myocardial perfusion scan 08/2015 which showed no ischemia and normal EF. It was felt to be a low risk study.  Other issues include HTN, HLD, GERD, carotid disease and obesity.   He was last seen in February and felt to be doing ok.  Phone call today - I spoke to patient. Fmr pt of Dr. Mare Ferrari, set up to establish with Dr. Martinique in fall (no current future visit on file). Pt is retired [employer]. Pt explains he has history of stents and atypical angina. Typically his anginal discomfort is lateral to  breast. Noted he did a little bit of lifting yesterday. He does this on occasion, and is familiar w/ chostocondritis, notes usual improvement w sternal massage.He had some soreness from lifting yesterday. Went to bed around 10:30 but awoke at about 11:45pm and noted he hates to use the word "severe" or "pain", but had as he rated a 7/10 pain in left chest.  He took NTG x1 and the pain resolved after several minutes. He took a Tums to see if additional relief, noted this may have helped also.  Pt denies SOB, dizziness, radiating jaw or arm pain w/ occurrence last night.  Pt asking to be seen in office. Pt sched to see Truitt Merle today at 3pm.  I have given pt instruction to go to ED today instead if he has a return of his symptoms or any SOB, dizziness. Pt appreciative of advice given, and voiced understanding of recommendations and appt information.  Thus added to my schedule today.  Comes back today. Here alone. He notes that this spell last night - pretty reminiscent of his prior chest pain syndrome. NTG helped. It lasted about 10 minutes. He has noted more fatigue over the past several months - not really clear as to why he has. He does some exercise - chair yoga and chair volleyball. He noted that Friday night while at the [place], he walked up a very small incline and had to stop and rest. Wife feels like he tires more easily.   Past Medical History  Diagnosis Date  . Sinus bradycardia   . Dyslipidemia     History of dyslipidemia  . Mild depression   . BPH (benign prostatic hypertrophy)     S/P TURP 1995  . Hypertension   . Reflux OCCASIONAL    TAKES PRILOSEC  . Osteoarthritis     RIGHT ANKLE, BILATERAL WRIST, BACK  . Borderline diabetes     DIET CONTROLLED  . Cataract of right eye   . Coronary artery disease     a. 2003 s/p PCI/stenting; b. 02/2014 Abnl nuc -> Cath/PCI: LM nl, LAD nl, D2 95p (2.5x12 Promus DES), LCX <20, OM1 patent stent, RCA 50d, EF 55-65%. c. 08/2015: normal nuc.  Jared Clark  GERD (gastroesophageal reflux disease)   . Carotid artery disease ([place])     a. Carotid duplex in 05/2013: [id number] RICA, [id number] LICA. b. Per Patient, doppler 05/2015 showed 1-39% on the R - being folloewd by primary care.    Past Surgical History  Procedure Laterality Date  . Lumbar laminectomy  1973    L4-S1  . Shoulder arthroscopy      Bilateral  . Coronary angioplasty  2003- FIRST OBTUSE MARGIANL VESSEL     X1 CYPHER STENT  . Cardiac catheterization  02-06-09    NO INTERVENTION  . Tonsillectomy and adenoidectomy  CHILD  . Cholecystectomy  123XX123    W/ UMBILICAL HERNIA REPAIR  . Hernia repair  [id number]    Umbilical  . Inguinal hernia repair  1985  . Transurethral resection of prostate  1995  . Cervical discectomy  1989    C4 - 5  . Pilonidal cyst excision  1961  . Knee arthroscopy  11/05/2011    Procedure: ARTHROSCOPY KNEE;  Surgeon: Gearlean Alf;  Location: [facility];  Service: Orthopedics;  Laterality: Right;  RIGHT ARTHROSCOPY KNEE WITH DEBRIDEMENT, Chondroplasty  . Carotid stent    . Coronary stent placement      DES to first diagonal        DR Clark  . Left heart catheterization with coronary angiogram N/A 02/10/2014    Procedure: LEFT HEART CATHETERIZATION WITH CORONARY ANGIOGRAM;  Surgeon: Jared M Martinique, MD;  Location: [facility] CATH LAB;  Service: Cardiovascular;  Laterality: N/A;     Medications: Current Outpatient Prescriptions  Medication Sig Dispense Refill  . aspirin 81 MG tablet Take 81 mg by mouth daily.     Jared Clark atorvastatin (LIPITOR) 80 MG tablet Take 0.5 tablets (40 mg total) by mouth at bedtime. 90 tablet 3  . bismuth subsalicylate (KAOPECTATE) 262 MG/15ML suspension Take 30 mLs by mouth every 6 (six) hours as needed for indigestion or diarrhea or loose stools.    . calcium carbonate (TUMS - DOSED IN MG ELEMENTAL CALCIUM) 500 MG chewable tablet Chew 1 tablet by mouth daily as needed for indigestion or heartburn.    . clopidogrel (PLAVIX) 75 MG tablet Take  75 mg by mouth daily.     . finasteride (PROSCAR) 5 MG tablet Take 5 mg by mouth 2 (two) times a week.     . loperamide (IMODIUM) 2 MG capsule Take 2 mg by mouth as needed for diarrhea or loose stools.    Jared Clark losartan (COZAAR) 25 MG tablet Take 1 tablet by mouth  daily 90 tablet 2  . metoprolol succinate (TOPROL-XL) 25 MG 24 hr tablet Take 12.5 mg by mouth as directed. 1/2 tablet daily    . Multiple Vitamins-Minerals (MULTIVITAMIN PO) Take 1 tablet by mouth daily.    Jared Clark  nitroGLYCERIN (NITROSTAT) 0.4 MG SL tablet Place 0.4 mg under the tongue every 5 (five) minutes as needed for chest pain (x 3 tablets daily).    . valACYclovir (VALTREX) 1000 MG tablet Take 1,000 mg by mouth as needed. Outbreak.    . vitamin C (ASCORBIC ACID) 500 MG tablet Take 500 mg by mouth daily.    Jared Clark zolpidem (AMBIEN) 5 MG tablet Patient take 1/2 tablet by mouth as needed for sleep     No current facility-administered medications for this visit.    Allergies: Allergies  Allergen Reactions  . Flexeril [Cyclobenzaprine] Swelling  . Methocarbamol Swelling  . Orphenadrine Citrate Swelling  . Ramipril     DRY COUGH    Social History: The patient  reports that he has never smoked. He has never used smokeless tobacco. He reports that he drinks alcohol. He reports that he does not use illicit drugs.   Family History: The patient's family history includes Hypertension in his mother; Lung cancer in his father. There is no history of Heart attack or Stroke.   Review of Systems: Please see the history of present illness.   Otherwise, the review of systems is positive for none.   All other systems are reviewed and negative.   Physical Exam: VS:  BP 160/80 mmHg  Pulse 86  Ht 6\' 4"  (1.93 m)  Wt 258 lb (117.028 kg)  BMI 31.42 kg/m2 .  BMI Body mass index is 31.42 kg/(m^2).  Wt Readings from Last 3 Encounters:  05/21/16 258 lb (117.028 kg)  01/28/16 249 lb (112.946 kg)  10/03/15 260 lb (117.935 kg)    General: Pleasant.  Well developed, well nourished and in no acute distress. He has gained weight from last visit.  HEENT: Normal. Neck: Supple, no JVD, carotid bruits, or masses noted.  Cardiac: Regular rate and rhythm. No murmurs, rubs, or gallops. No edema.  Respiratory:  Lungs are clear to auscultation bilaterally with normal work of breathing.  GI: Soft and nontender.  MS: No deformity or atrophy. Gait and ROM intact. Skin: Warm and dry. Color is normal.  Neuro:  Strength and sensation are intact and no gross focal deficits noted.  Psych: Alert, appropriate and with normal affect.   LABORATORY DATA:  EKG:  EKG is ordered today. This demonstrates NSR he has lateral ST and T wave changes - unchanged - reviewed with Dr. Burt Knack.  Lab Results  Component Value Date   WBC 6.6 02/11/2014   HGB 14.4 02/11/2014   HCT 40.9 02/11/2014   PLT 185 02/11/2014   GLUCOSE 93 02/11/2014   CHOL 137 09/15/2011   TRIG 77.0 09/15/2011   HDL 47.30 09/15/2011   LDLCALC 74 09/15/2011   ALT 17 03/17/2011   AST 18 03/17/2011   NA 137 02/11/2014   K 4.4 02/11/2014   CL 103 02/11/2014   CREATININE 0.83 02/11/2014   BUN 13 02/11/2014   CO2 23 02/11/2014   INR 1.1* 02/07/2014    BNP (last 3 results) No results for input(s): BNP in the last 8760 hours.  ProBNP (last 3 results) No results for input(s): PROBNP in the last 8760 hours.   Other Studies Reviewed Today:  Cardiac Cath Final Conclusions:  1. Single vessel obstructive CAD involving the first diagonal. The old stent in the OM is patent. 2. Normal LV function. 3. Successful stenting of the first diagonal with DES.   Recommendations:  Continue dual antiplatelet therapy with ASA, Plavix for at least a year. Will increase statin  dose. Anticipate DC in am with follow up with Dr. Mare Ferrari.  [name] 02/10/2014, 8:46 AM   Myoview Study Highlights from 08/2015     Nuclear stress EF: 62%.  There was no ST segment deviation noted during  stress.  Defect 1: There is a medium defect of severe severity present in the basal inferior, basal inferolateral and mid inferior location. Consistent with diaphragmatic attenuation and improves with stress.  The study is normal.  This is a low risk study.  The left ventricular ejection fraction is normal (55-65%).        Assessment/Plan:  1. CAD with first stent in the 1st OM back in 2003; DES to 2nd DX in March 2015 by Dr. Martinique. Negative Myoview from 08/2015 - now with a recurrent spell of angina - also with more fatigue - reminiscent of prior chest pain syndrome. Discussed option of adding nitrates and following versus cardiac cath. His stress test was just 8 months ago. Discussed with Dr. Burt Knack. Dr. Joneen Caraway would like to proceed on with cardiac cath. The patient understands that risks include but are not limited to stroke (1 in 1000), death (1 in 32), kidney failure [usually temporary] (1 in 500), bleeding (1 in 200), allergic reaction [possibly serious] (1 in 200), and agrees to proceed.   2. HTN - BP is up here today - I think this is more related to the nature of the visit. Would follow for now.   3. HLD - on lower dose of statin  4. Carotid disease   5. Obesity/deconditioning  Current medicines are reviewed with the patient today.  The patient does not have concerns regarding medicines other than what has been noted above.  The following changes have been made:  See above.  Labs/ tests ordered today include:    Orders Placed This Encounter  Procedures  . Basic metabolic panel  . CBC  . Protime-INR  . APTT  . EKG 12-Lead     Disposition:   Further disposition to follow.   Patient is agreeable to this plan and will call if any problems develop in the interim.   Signed: Burtis Junes, RN, ANP-C 05/21/2016 10:24 AM  [facility] [address] [place], [place]  [address] Phone: [phone] Fax: [phone]

## 2016-05-22 NOTE — Discharge Instructions (Signed)
Radial Site Care °Refer to this sheet in the next few weeks. These instructions provide you with information about caring for yourself after your procedure. Your health care provider may also give you more specific instructions. Your treatment has been planned according to current medical practices, but problems sometimes occur. Call your health care provider if you have any problems or questions after your procedure. °WHAT TO EXPECT AFTER THE PROCEDURE °After your procedure, it is typical to have the following: °· Bruising at the radial site that usually fades within 1-2 weeks. °· Blood collecting in the tissue (hematoma) that may be painful to the touch. It should usually decrease in size and tenderness within 1-2 weeks. °HOME CARE INSTRUCTIONS °· Take medicines only as directed by your health care provider. °· You may shower 24-48 hours after the procedure or as directed by your health care provider. Remove the bandage (dressing) and gently wash the site with plain soap and water. Pat the area dry with a clean towel. Do not rub the site, because this may cause bleeding. °· Do not take baths, swim, or use a hot tub until your health care provider approves. °· Check your insertion site every day for redness, swelling, or drainage. °· Do not apply powder or lotion to the site. °· Do not flex or bend the affected arm for 24 hours or as directed by your health care provider. °· Do not push or pull heavy objects with the affected arm for 24 hours or as directed by your health care provider. °· Do not lift over 10 lb (4.5 kg) for 5 days after your procedure or as directed by your health care provider. °· Ask your health care provider when it is okay to: °¨ Return to work or school. °¨ Resume usual physical activities or sports. °¨ Resume sexual activity. °· Do not drive home if you are discharged the same day as the procedure. Have someone else drive you. °· You may drive 24 hours after the procedure unless otherwise  instructed by your health care provider. °· Do not operate machinery or power tools for 24 hours after the procedure. °· If your procedure was done as an outpatient procedure, which means that you went home the same day as your procedure, a responsible adult should be with you for the first 24 hours after you arrive home. °· Keep all follow-up visits as directed by your health care provider. This is important. °SEEK MEDICAL CARE IF: °· You have a fever. °· You have chills. °· You have increased bleeding from the radial site. Hold pressure on the site. °SEEK IMMEDIATE MEDICAL CARE IF: °· You have unusual pain at the radial site. °· You have redness, warmth, or swelling at the radial site. °· You have drainage (other than a small amount of blood on the dressing) from the radial site. °· The radial site is bleeding, and the bleeding does not stop after 30 minutes of holding steady pressure on the site. °· Your arm or hand becomes pale, cool, tingly, or numb. °  °This information is not intended to replace advice given to you by your health care provider. Make sure you discuss any questions you have with your health care provider. °  °Document Released: 12/27/2010 Document Revised: 12/15/2014 Document Reviewed: 06/12/2014 °Elsevier Interactive Patient Education ©2016 [employer] ° °

## 2016-05-22 NOTE — Progress Notes (Signed)
Right radial access site continues to have a slight ooze. Dr Martinique was paged/ call returned. Pt site level 0, no pain, 3+ radial pulse, VSS. Order was to place coban around site and monitor for 30 minutes then DC.

## 2016-05-23 ENCOUNTER — Encounter: Payer: Self-pay | Admitting: Nurse Practitioner

## 2016-05-23 ENCOUNTER — Encounter (HOSPITAL_COMMUNITY): Payer: Self-pay | Admitting: Cardiology

## 2016-05-27 DIAGNOSIS — E784 Other hyperlipidemia: Secondary | ICD-10-CM | POA: Diagnosis not present

## 2016-05-27 DIAGNOSIS — I10 Essential (primary) hypertension: Secondary | ICD-10-CM | POA: Diagnosis not present

## 2016-05-27 DIAGNOSIS — Z125 Encounter for screening for malignant neoplasm of prostate: Secondary | ICD-10-CM | POA: Diagnosis not present

## 2016-05-27 DIAGNOSIS — R7301 Impaired fasting glucose: Secondary | ICD-10-CM | POA: Diagnosis not present

## 2016-06-03 DIAGNOSIS — I10 Essential (primary) hypertension: Secondary | ICD-10-CM | POA: Diagnosis not present

## 2016-06-03 DIAGNOSIS — Z1389 Encounter for screening for other disorder: Secondary | ICD-10-CM | POA: Diagnosis not present

## 2016-06-03 DIAGNOSIS — Z86010 Personal history of colonic polyps: Secondary | ICD-10-CM | POA: Diagnosis not present

## 2016-06-03 DIAGNOSIS — E669 Obesity, unspecified: Secondary | ICD-10-CM | POA: Diagnosis not present

## 2016-06-03 DIAGNOSIS — Z6831 Body mass index (BMI) 31.0-31.9, adult: Secondary | ICD-10-CM | POA: Diagnosis not present

## 2016-06-03 DIAGNOSIS — Z0000 Encounter for general adult medical examination without abnormal findings: Secondary | ICD-10-CM | POA: Diagnosis not present

## 2016-06-03 DIAGNOSIS — R7301 Impaired fasting glucose: Secondary | ICD-10-CM | POA: Diagnosis not present

## 2016-06-03 DIAGNOSIS — K219 Gastro-esophageal reflux disease without esophagitis: Secondary | ICD-10-CM | POA: Diagnosis not present

## 2016-06-03 DIAGNOSIS — Z9861 Coronary angioplasty status: Secondary | ICD-10-CM | POA: Diagnosis not present

## 2016-06-03 DIAGNOSIS — I6529 Occlusion and stenosis of unspecified carotid artery: Secondary | ICD-10-CM | POA: Diagnosis not present

## 2016-06-03 DIAGNOSIS — E784 Other hyperlipidemia: Secondary | ICD-10-CM | POA: Diagnosis not present

## 2016-06-09 ENCOUNTER — Ambulatory Visit: Payer: Medicare Other | Admitting: Physician Assistant

## 2016-06-11 ENCOUNTER — Other Ambulatory Visit (HOSPITAL_COMMUNITY): Payer: Self-pay | Admitting: Internal Medicine

## 2016-06-11 DIAGNOSIS — I6529 Occlusion and stenosis of unspecified carotid artery: Secondary | ICD-10-CM

## 2016-06-12 ENCOUNTER — Ambulatory Visit (HOSPITAL_COMMUNITY)
Admission: RE | Admit: 2016-06-12 | Discharge: 2016-06-12 | Disposition: A | Discharge status: Home / Self Care | Payer: Medicare Other | Source: Ambulatory Visit | Attending: Vascular Surgery | Admitting: Vascular Surgery

## 2016-06-12 DIAGNOSIS — I10 Essential (primary) hypertension: Secondary | ICD-10-CM | POA: Insufficient documentation

## 2016-06-12 DIAGNOSIS — K219 Gastro-esophageal reflux disease without esophagitis: Secondary | ICD-10-CM | POA: Insufficient documentation

## 2016-06-12 DIAGNOSIS — I2510 Atherosclerotic heart disease of native coronary artery without angina pectoris: Secondary | ICD-10-CM | POA: Diagnosis not present

## 2016-06-12 DIAGNOSIS — E785 Hyperlipidemia, unspecified: Secondary | ICD-10-CM | POA: Diagnosis not present

## 2016-06-12 DIAGNOSIS — F329 Major depressive disorder, single episode, unspecified: Secondary | ICD-10-CM | POA: Diagnosis not present

## 2016-06-12 DIAGNOSIS — I6529 Occlusion and stenosis of unspecified carotid artery: Secondary | ICD-10-CM | POA: Insufficient documentation

## 2016-06-12 LAB — VAS US CAROTID
LCCADSYS: -84 cm/s
LCCAPDIAS: 20 cm/s
LCCAPSYS: 133 cm/s
LEFT ECA DIAS: -12 cm/s
LICADDIAS: -14 cm/s
LICAPDIAS: -14 cm/s
Left CCA dist dias: -15 cm/s
Left ICA dist sys: -54 cm/s
Left ICA prox sys: -51 cm/s
RCCAPDIAS: 22 cm/s
RCCAPSYS: 132 cm/s
RIGHT CCA MID DIAS: -9 cm/s
RIGHT ECA DIAS: -7 cm/s
RIGHT VERTEBRAL DIAS: 6 cm/s
Right cca dist sys: -60 cm/s

## 2016-06-17 ENCOUNTER — Ambulatory Visit (INDEPENDENT_AMBULATORY_CARE_PROVIDER_SITE_OTHER): Payer: Medicare Other | Admitting: Nurse Practitioner

## 2016-06-17 ENCOUNTER — Encounter: Payer: Self-pay | Admitting: Nurse Practitioner

## 2016-06-17 VITALS — BP 118/68 | HR 64 | Ht 76.0 in

## 2016-06-17 DIAGNOSIS — I10 Essential (primary) hypertension: Secondary | ICD-10-CM

## 2016-06-17 DIAGNOSIS — E785 Hyperlipidemia, unspecified: Secondary | ICD-10-CM | POA: Diagnosis not present

## 2016-06-17 DIAGNOSIS — Z9889 Other specified postprocedural states: Secondary | ICD-10-CM | POA: Diagnosis not present

## 2016-06-17 DIAGNOSIS — I259 Chronic ischemic heart disease, unspecified: Secondary | ICD-10-CM

## 2016-06-17 NOTE — Progress Notes (Signed)
CARDIOLOGY OFFICE NOTE  Date:  06/17/2016    [name] Date of Birth: 08/24/1936 Medical Record [id number]  PCP:  Marton Redwood, MD  Cardiologist:  Jocilyn Trego & Martinique    Chief Complaint  Patient presents with  . Coronary Artery Disease  . Hyperlipidemia    Post cath visit - seen for Dr. Martinique    History of Present Illness: Jared Clark is a 80 y.o. male who presents today for a post cardiac cath visit. Former patient of Dr. Sherryl Barters. He is to establish with Dr. Martinique going forward.  He has known CAD. In September of 2003 he underwent stenting of his first obtuse marginal vessel. He had a repeat cardiac catheterization in March 2010 after an abnormal stress Cardiolite. The cardiac catheterization showed long term patency of the prior stent and nonobstructive atherosclerotic disease elsewhere and he had normal left ventricular function. Seen as a work in on 01/25/14 because of recurring chest pain. He had LexiScan Myoview. This showed significant reversible anterolateral ischemia. On 02/10/14 the patient underwent cardiac catheterization with successful stenting of the second diagonal with a drug-eluting stent with Dr. Martinique. He was seen in September 2016 complaining of atypical chest discomfort. Because of his atypical chest pain the patient underwent a myocardial perfusion scan 08/2015 which showed no ischemia and normal EF. It was felt to be a low risk study. Other issues include HTN, HLD, GERD, carotid disease and obesity.   I saw him last month after he had called in with an episode of chest pain. Referred on for repeat cardiac cath - this was stable - he is to continue with medical management.   Comes back today. Here alone. Grumpy today. Refused to weigh. No real chest pain. He says he knows what he needs to do - just not motivated. Some limitations from his arthritis. No NTG use. Breathing ok.  He has had recent carotid dopplers which are stable.   Past Medical  History  Diagnosis Date  . Sinus bradycardia   . Dyslipidemia     History of dyslipidemia  . Mild depression   . BPH (benign prostatic hypertrophy)     S/P TURP 1995  . Hypertension   . Reflux OCCASIONAL    TAKES PRILOSEC  . Osteoarthritis     RIGHT ANKLE, BILATERAL WRIST, BACK  . Borderline diabetes     DIET CONTROLLED  . Cataract of right eye   . Coronary artery disease     a. 2003 s/p PCI/stenting; b. 02/2014 Abnl nuc -> Cath/PCI: LM nl, LAD nl, D2 95p (2.5x12 Promus DES), LCX <20, OM1 patent stent, RCA 50d, EF 55-65%. c. 08/2015: normal nuc.  [name] GERD (gastroesophageal reflux disease)   . Carotid artery disease ([place])     a. Carotid duplex in 05/2013: 123456 RICA, [id number] LICA. b. Per Patient, doppler 05/2015 showed 1-39% on the R - being folloewd by primary care.    Past Surgical History  Procedure Laterality Date  . Lumbar laminectomy  1973    L4-S1  . Shoulder arthroscopy      Bilateral  . Coronary angioplasty  2003- FIRST OBTUSE MARGIANL VESSEL     X1 CYPHER STENT  . Cardiac catheterization  02-06-09    NO INTERVENTION  . Tonsillectomy and adenoidectomy  CHILD  . Cholecystectomy  123XX123    W/ UMBILICAL HERNIA REPAIR  . Hernia repair  123XX123    Umbilical  . Inguinal hernia repair  1985  . Transurethral resection  of prostate  1995  . Cervical discectomy  1989    C4 - 5  . Pilonidal cyst excision  1961  . Knee arthroscopy  11/05/2011    Procedure: ARTHROSCOPY KNEE;  Surgeon: Gearlean Alf;  Location: [facility];  Service: Orthopedics;  Laterality: Right;  RIGHT ARTHROSCOPY KNEE WITH DEBRIDEMENT, Chondroplasty  . Carotid stent    . Coronary stent placement      DES to first diagonal        DR Martinique  . Left heart catheterization with coronary angiogram N/A 02/10/2014    Procedure: LEFT HEART CATHETERIZATION WITH CORONARY ANGIOGRAM;  Surgeon: Peter M Martinique, MD;  Location: [facility] CATH LAB;  Service: Cardiovascular;  Laterality: N/A;  . Cardiac catheterization N/A  05/22/2016    Procedure: Left Heart Cath and Coronary Angiography;  Surgeon: Peter M Martinique, MD;  Location: [facility] CV LAB;  Service: Cardiovascular;  Laterality: N/A;     Medications: Current Outpatient Prescriptions  Medication Sig Dispense Refill  . aspirin 81 MG tablet Take 81 mg by mouth daily.     [name] atorvastatin (LIPITOR) 80 MG tablet Take 0.5 tablets (40 mg total) by mouth at bedtime. (Patient taking differently: Take 80 mg by mouth at bedtime. ) 90 tablet 3  . bismuth subsalicylate (KAOPECTATE) 262 MG/15ML suspension Take 30 mLs by mouth every 6 (six) hours as needed for indigestion or diarrhea or loose stools.    . calcium carbonate (TUMS - DOSED IN MG ELEMENTAL CALCIUM) 500 MG chewable tablet Chew 1 tablet by mouth daily as needed for indigestion or heartburn.    . clopidogrel (PLAVIX) 75 MG tablet Take 75 mg by mouth daily.     . finasteride (PROSCAR) 5 MG tablet Take 5 mg by mouth 2 (two) times a week.     . loperamide (IMODIUM) 2 MG capsule Take 2 mg by mouth as needed for diarrhea or loose stools.    [name] losartan (COZAAR) 25 MG tablet Take 1 tablet by mouth  daily 90 tablet 2  . metoprolol succinate (TOPROL-XL) 25 MG 24 hr tablet Take 12.5 mg by mouth as directed. 1/2 tablet daily    . Multiple Vitamins-Minerals (MULTIVITAMIN PO) Take 1 tablet by mouth daily.    . nitroGLYCERIN (NITROSTAT) 0.4 MG SL tablet Place 0.4 mg under the tongue every 5 (five) minutes as needed for chest pain (x 3 tablets daily).    . valACYclovir (VALTREX) 1000 MG tablet Take 1,000 mg by mouth as needed. Outbreak.    . vitamin C (ASCORBIC ACID) 500 MG tablet Take 500 mg by mouth daily.    [name] zolpidem (AMBIEN) 5 MG tablet Patient take 1/2 tablet by mouth as needed for sleep     No current facility-administered medications for this visit.    Allergies: Allergies  Allergen Reactions  . Flexeril [Cyclobenzaprine] Swelling  . Methocarbamol Swelling  . Orphenadrine Citrate Swelling  . Ramipril     DRY  COUGH    Social History: The patient  reports that he has never smoked. He has never used smokeless tobacco. He reports that he drinks alcohol. He reports that he does not use illicit drugs.   Family History: The patient's family history includes Hypertension in his mother; Lung cancer in his father. There is no history of Heart attack or Stroke.   Review of Systems: Please see the history of present illness.   Otherwise, the review of systems is positive for none.   All other systems are  reviewed and negative.   Physical Exam: VS:  BP 118/68 mmHg  Pulse 64  Ht 6\' 4"  (1.93 m) .  BMI There is no weight on file to calculate BMI.  Wt Readings from Last 3 Encounters:  05/22/16 257 lb (116.574 kg)  05/21/16 258 lb (117.028 kg)  01/28/16 249 lb (112.946 kg)    General: Little grumpy today. Refused to weigh. He is alert and in no acute distress.  HEENT: Normal. Neck: Supple, no JVD, carotid bruits, or masses noted.  Cardiac: Regular rate and rhythm. No murmurs, rubs, or gallops. No edema.  Respiratory:  Lungs are clear to auscultation bilaterally with normal work of breathing.  GI: Soft and nontender.  MS: No deformity or atrophy. Gait and ROM intact. Skin: Warm and dry. Color is normal.  Neuro:  Strength and sensation are intact and no gross focal deficits noted.  Psych: Alert, appropriate and with normal affect. Right wrist cath site looks fine.   LABORATORY DATA:  EKG:  EKG is not ordered today.  Lab Results  Component Value Date   WBC 5.7 05/21/2016   HGB 14.7 05/21/2016   HCT 43.7 05/21/2016   PLT 200 05/21/2016   GLUCOSE 99 05/21/2016   CHOL 137 09/15/2011   TRIG 77.0 09/15/2011   HDL 47.30 09/15/2011   LDLCALC 74 09/15/2011   ALT 17 03/17/2011   AST 18 03/17/2011   NA 137 05/21/2016   K 4.1 05/21/2016   CL 109 05/21/2016   CREATININE 0.78 05/21/2016   BUN 18 05/21/2016   CO2 22 05/21/2016   INR 1.06 05/21/2016    BNP (last 3 results) No results for  input(s): BNP in the last 8760 hours.  ProBNP (last 3 results) No results for input(s): PROBNP in the last 8760 hours.   Other Studies Reviewed Today: Procedures    Left Heart Cath and Coronary Angiography 05/2016    Conclusion     Prox RCA lesion, 20% stenosed.  Dist RCA lesion, 50% stenosed.  Ost 1st Mrg to 1st Mrg lesion, 20% stenosed.  The left ventricular systolic function is normal.  1. No obstructive CAD 2. Normal LV function  Plan: continue medical therapy     Assessment/Plan: 1. CAD with first stent in the 1st OM back in 2003; DES to 2nd DX in March 2015 by Dr. Martinique. Negative Myoview from 08/2015 - now s/p recent cardiac cath due to chest pain with stable findings. To continue with medical management. Needs to work on CV risk factor modification but admits to not having the motivation - discussed at length.   2. HTN - BP is good on his current therapy.   3. HLD - on lower dose of statin  4. Carotid disease with recent stable doppler study   5. Obesity/deconditioning - discussed at length today.   Current medicines are reviewed with the patient today.  The patient does not have concerns regarding medicines other than what has been noted above.  The following changes have been made:  See above.  Labs/ tests ordered today include:   No orders of the defined types were placed in this encounter.     Disposition:   FU with Dr. Martinique in October.   Patient is agreeable to this plan and will call if any problems develop in the interim.   Signed: Burtis Junes, RN, ANP-C 06/17/2016 8:22 AM  [facility] [address] [place], [place]  [address] Phone: [phone] Fax: [phone]         

## 2016-06-17 NOTE — Patient Instructions (Addendum)
We will be checking the following labs today - NONE   Medication Instructions:    Continue with your current medicines.     Testing/Procedures To Be Arranged:  N/A  Follow-Up:   See Dr. Martinique in October   Other Special Instructions:   N/A    If you need a refill on your cardiac medications before your next appointment, please call your pharmacy.   Call the [facility] [place] office at [phone] if you have any questions, problems or concerns.

## 2016-06-24 ENCOUNTER — Encounter: Payer: Self-pay | Admitting: Nurse Practitioner

## 2016-07-04 DIAGNOSIS — L814 Other melanin hyperpigmentation: Secondary | ICD-10-CM | POA: Diagnosis not present

## 2016-07-04 DIAGNOSIS — L821 Other seborrheic keratosis: Secondary | ICD-10-CM | POA: Diagnosis not present

## 2016-07-04 DIAGNOSIS — D225 Melanocytic nevi of trunk: Secondary | ICD-10-CM | POA: Diagnosis not present

## 2016-07-04 DIAGNOSIS — D1801 Hemangioma of skin and subcutaneous tissue: Secondary | ICD-10-CM | POA: Diagnosis not present

## 2016-07-04 DIAGNOSIS — L304 Erythema intertrigo: Secondary | ICD-10-CM | POA: Diagnosis not present

## 2016-07-04 DIAGNOSIS — D2271 Melanocytic nevi of right lower limb, including hip: Secondary | ICD-10-CM | POA: Diagnosis not present

## 2016-08-23 DIAGNOSIS — Z23 Encounter for immunization: Secondary | ICD-10-CM | POA: Diagnosis not present

## 2016-09-15 ENCOUNTER — Encounter: Payer: Self-pay | Admitting: Cardiology

## 2016-09-15 ENCOUNTER — Other Ambulatory Visit: Payer: Self-pay | Admitting: *Deleted

## 2016-09-15 MED ORDER — LOSARTAN POTASSIUM 25 MG PO TABS: 25.0000 mg | ORAL_TABLET | Freq: Every day | ORAL | 2 refills | Status: DC

## 2016-09-16 DIAGNOSIS — R05 Cough: Secondary | ICD-10-CM | POA: Diagnosis not present

## 2016-09-16 DIAGNOSIS — J069 Acute upper respiratory infection, unspecified: Secondary | ICD-10-CM | POA: Diagnosis not present

## 2016-09-16 DIAGNOSIS — Z6830 Body mass index (BMI) 30.0-30.9, adult: Secondary | ICD-10-CM | POA: Diagnosis not present

## 2016-09-16 DIAGNOSIS — R062 Wheezing: Secondary | ICD-10-CM | POA: Diagnosis not present

## 2016-10-15 NOTE — Progress Notes (Signed)
CARDIOLOGY OFFICE NOTE  Date:  10/16/2016    Jared Clark Date of Birth: 07/20/1936 Medical Record [id number]  PCP:  Jared Redwood, MD  Cardiologist:  Jared Martinique  MD  Chief Complaint  Patient presents with  . Follow-up    Pt states no Sx.     History of Present Illness: Jared Clark is a 80 y.o. male who presents today for follow up CAD. Former patient of Jared Clark.   He has known CAD. In September of 2003 he underwent stenting of his first obtuse marginal vessel. He had a repeat cardiac catheterization in March 2010 after an abnormal stress Cardiolite. The cardiac catheterization showed long term patency of the prior stent and nonobstructive atherosclerotic disease elsewhere and he had normal left ventricular function. Seen as a work in on 01/25/14 because of recurring chest pain. He had LexiScan Myoview. This showed significant reversible anterolateral ischemia. On 02/10/14 the patient underwent cardiac catheterization with successful stenting of the second diagonal with a drug-eluting stent with Dr. Martinique. He was seen in September 2016 complaining of atypical chest discomfort. Because of his atypical chest pain the patient underwent a myocardial perfusion scan 08/2015 which showed no ischemia and normal EF. It was felt to be a low risk study. Seen again in June 2017 with atypical chest pain and referred for cardiac cath which was unchanged. Other issues include HTN, HLD, GERD, carotid disease and obesity.   On follow up today he is doing well. Now living at [facility]. Rare intermittent chest pain 1-2/10. Only used Ntg x 1. Still very sedentary- more of a motivational issue. Concerned about some memory issues particularly with names.  Past Medical History:  Diagnosis Date  . Borderline diabetes    DIET CONTROLLED  . BPH (benign prostatic hypertrophy)    S/P TURP 1995  . Carotid artery disease ([place])    a. Carotid duplex in 05/2013: 123456 RICA, [id number] LICA. b. Per  Patient, doppler 05/2015 showed 1-39% on the R - being folloewd by primary care.  . Cataract of right eye   . Coronary artery disease    a. 2003 s/p PCI/stenting; b. 02/2014 Abnl nuc -> Cath/PCI: LM nl, LAD nl, D2 95p (2.5x12 Promus DES), LCX <20, OM1 patent stent, RCA 50d, EF 55-65%. c. 08/2015: normal nuc.  [name] Dyslipidemia    History of dyslipidemia  . GERD (gastroesophageal reflux disease)   . Hypertension   . Mild depression ([place])   . Osteoarthritis    RIGHT ANKLE, BILATERAL WRIST, BACK  . Reflux OCCASIONAL   TAKES PRILOSEC  . Sinus bradycardia     Past Surgical History:  Procedure Laterality Date  . CARDIAC CATHETERIZATION  02-06-09   NO INTERVENTION  . CARDIAC CATHETERIZATION N/A 05/22/2016   Procedure: Left Heart Cath and Coronary Angiography;  Surgeon: Jared M Martinique, MD;  Location: [facility] CV LAB;  Service: Cardiovascular;  Laterality: N/A;  . CAROTID STENT    . CERVICAL DISCECTOMY  1989   C4 - 5  . CHOLECYSTECTOMY  123XX123   W/ UMBILICAL HERNIA REPAIR  . CORONARY ANGIOPLASTY  2003- FIRST OBTUSE MARGIANL VESSEL    X1 CYPHER STENT  . CORONARY STENT PLACEMENT     DES to first diagonal        DR Clark  . HERNIA REPAIR  123XX123   Umbilical  . INGUINAL HERNIA REPAIR  1985  . KNEE ARTHROSCOPY  11/05/2011   Procedure: ARTHROSCOPY KNEE;  Surgeon: Gearlean Alf;  Location: [facility];  Service: Orthopedics;  Laterality: Right;  RIGHT ARTHROSCOPY KNEE WITH DEBRIDEMENT, Chondroplasty  . LEFT HEART CATHETERIZATION WITH CORONARY ANGIOGRAM N/A 02/10/2014   Procedure: LEFT HEART CATHETERIZATION WITH CORONARY ANGIOGRAM;  Surgeon: Jared M Martinique, MD;  Location: [facility] CATH LAB;  Service: Cardiovascular;  Laterality: N/A;  . LUMBAR LAMINECTOMY  1973   L4-S1  . PILONIDAL CYST EXCISION  1961  . SHOULDER ARTHROSCOPY     Bilateral  . TONSILLECTOMY AND ADENOIDECTOMY  CHILD  . TRANSURETHRAL RESECTION OF PROSTATE  1995     Medications: Current Outpatient Prescriptions    Medication Sig Dispense Refill  . aspirin 81 MG tablet Take 81 mg by mouth daily.     [name] atorvastatin (LIPITOR) 80 MG tablet Take 0.5 tablets (40 mg total) by mouth at bedtime. (Patient taking differently: Take 80 mg by mouth at bedtime. ) 90 tablet 3  . calcium carbonate (TUMS - DOSED IN MG ELEMENTAL CALCIUM) 500 MG chewable tablet Chew 1 tablet by mouth daily as needed for indigestion or heartburn.    . clopidogrel (PLAVIX) 75 MG tablet Take 75 mg by mouth daily.     [name] loperamide (IMODIUM) 2 MG capsule Take 2 mg by mouth as needed for diarrhea or loose stools.    [name] losartan (COZAAR) 25 MG tablet Take 1 tablet (25 mg total) by mouth daily. 90 tablet 2  . metoprolol succinate (TOPROL-XL) 25 MG 24 hr tablet Take 12.5 mg by mouth as directed. 1/2 tablet daily    . Multiple Vitamins-Minerals (MULTIVITAMIN PO) Take 1 tablet by mouth daily.    . nitroGLYCERIN (NITROSTAT) 0.4 MG SL tablet Place 0.4 mg under the tongue every 5 (five) minutes as needed for chest pain (x 3 tablets daily).    . valACYclovir (VALTREX) 1000 MG tablet Take 1,000 mg by mouth as needed. Outbreak.    . vitamin C (ASCORBIC ACID) 500 MG tablet Take 500 mg by mouth daily.    [name] zolpidem (AMBIEN) 5 MG tablet Patient take 1/2 tablet by mouth as needed for sleep     No current facility-administered medications for this visit.     Allergies: Allergies  Allergen Reactions  . Flexeril [Cyclobenzaprine] Swelling  . Methocarbamol Swelling  . Orphenadrine Citrate Swelling  . Ramipril     DRY COUGH    Social History: The patient  reports that he has never smoked. He has never used smokeless tobacco. He reports that he drinks alcohol. He reports that he does not use drugs.   Family History: The patient's family history includes Hypertension in his mother; Lung cancer in his father.   Review of Systems: Please see the history of present illness.   Otherwise, the review of systems is positive for none.   All other systems are  reviewed and negative.   Physical Exam: VS:  BP 139/76   Pulse 61   Ht 6\' 4"  (1.93 m)   Wt 256 lb (116.1 kg)   BMI 31.16 kg/m  .  BMI Body mass index is 31.16 kg/m.  Wt Readings from Last 3 Encounters:  10/16/16 256 lb (116.1 kg)  05/22/16 257 lb (116.6 kg)  05/21/16 258 lb (117 kg)    General:  He is alert and in no acute distress.   HEENT: Normal.  Neck: Supple, no JVD, carotid bruits, or masses noted.  Cardiac: Regular rate and rhythm. No murmurs, rubs, or gallops. No edema.  Respiratory:  Lungs are clear to auscultation bilaterally with  normal work of breathing.  GI: Soft and nontender.  MS: No deformity or atrophy. Gait and ROM intact.  Skin: Warm and dry. Color is normal.  Neuro:  Strength and sensation are intact and no gross focal deficits noted.  Psych: Alert, appropriate and with normal affect.    LABORATORY DATA:  EKG:  EKG is not ordered today.  Lab Results  Component Value Date   WBC 5.7 05/21/2016   HGB 14.7 05/21/2016   HCT 43.7 05/21/2016   PLT 200 05/21/2016   GLUCOSE 99 05/21/2016   CHOL 137 09/15/2011   TRIG 77.0 09/15/2011   HDL 47.30 09/15/2011   LDLCALC 74 09/15/2011   ALT 17 03/17/2011   AST 18 03/17/2011   NA 137 05/21/2016   K 4.1 05/21/2016   CL 109 05/21/2016   CREATININE 0.78 05/21/2016   BUN 18 05/21/2016   CO2 22 05/21/2016   INR 1.06 05/21/2016    BNP (last 3 results) No results for input(s): BNP in the last 8760 hours.  ProBNP (last 3 results) No results for input(s): PROBNP in the last 8760 hours.   Other Studies Reviewed Today:  Labs from primary care: 05/27/16 Cholesterol 101, triglycerides 64, HDL 40, LDL 48. Chemistries, TSH, Hgb are normal.  Procedures    Left Heart Cath and Coronary Angiography 05/2016    Conclusion     Prox RCA lesion, 20% stenosed.  Dist RCA lesion, 50% stenosed.  Ost 1st Mrg to 1st Mrg lesion, 20% stenosed.  The left ventricular systolic function is normal.  1. No obstructive  CAD 2. Normal LV function  Plan: continue medical therapy     Assessment/Plan: 1. CAD with first stent in the 1st OM back in 2003; DES to 2nd DX in March 2015. Negative Myoview from 08/2015.  S/p  cardiac cath June 2017 due to chest pain with stable findings. To continue with medical management. Needs to increase aerobic activity.  2. HTN - BP is well controlled on his current therapy.   3. HLD - excellent control.  4. Carotid disease with  stable doppler study in July  5. Obesity/deconditioning - encourage increased aerobic activity.   Current medicines are reviewed with the patient today.  The patient does not have concerns regarding medicines other than what has been noted above.  The following changes have been made:  See above.  Labs/ tests ordered today include:   No orders of the defined types were placed in this encounter.    Disposition:      Follow up in one year.  Signed: Peter Martinique MD, [facility]  10/16/2016 8:50 AM  [facility]

## 2016-10-16 ENCOUNTER — Ambulatory Visit (INDEPENDENT_AMBULATORY_CARE_PROVIDER_SITE_OTHER): Payer: Medicare Other | Admitting: Cardiology

## 2016-10-16 ENCOUNTER — Encounter: Payer: Self-pay | Admitting: Cardiology

## 2016-10-16 VITALS — BP 139/76 | HR 61 | Ht 76.0 in | Wt 256.0 lb

## 2016-10-16 DIAGNOSIS — I259 Chronic ischemic heart disease, unspecified: Secondary | ICD-10-CM | POA: Diagnosis not present

## 2016-10-16 DIAGNOSIS — E785 Hyperlipidemia, unspecified: Secondary | ICD-10-CM

## 2016-10-16 DIAGNOSIS — I119 Hypertensive heart disease without heart failure: Secondary | ICD-10-CM | POA: Diagnosis not present

## 2016-10-16 DIAGNOSIS — I2510 Atherosclerotic heart disease of native coronary artery without angina pectoris: Secondary | ICD-10-CM

## 2016-10-16 DIAGNOSIS — Z9861 Coronary angioplasty status: Secondary | ICD-10-CM

## 2016-10-16 NOTE — Patient Instructions (Addendum)
Continue your current therapy  I will see you in one year   

## 2017-04-20 ENCOUNTER — Telehealth: Payer: Self-pay | Admitting: Cardiology

## 2017-04-20 NOTE — Progress Notes (Signed)
CARDIOLOGY OFFICE NOTE  Date:  04/21/2017    [name] Date of Birth: 1935/12/31 Medical Record #[id number]  PCP:  Marton Redwood, MD  Cardiologist:  Rosaisela Jamroz Martinique  MD  Chief Complaint  Patient presents with  . Chest Pain    History of Present Illness: BURHANUDDIN KOHLMANN is a 81 y.o. male who presents today for evaluation of recent chest pain.    He has known CAD. In September of 2003 he underwent stenting of his first obtuse marginal vessel. He had a repeat cardiac catheterization in March 2010 after an abnormal stress Cardiolite. The cardiac catheterization showed long term patency of the prior stent and nonobstructive atherosclerotic disease elsewhere and he had normal left ventricular function. Seen as a work in on 01/25/14 because of recurring chest pain. He had LexiScan Myoview. This showed significant reversible anterolateral ischemia. On 02/10/14 the patient underwent cardiac catheterization with successful stenting of the second diagonal with a drug-eluting stent with Dr. Martinique. He was seen in September 2016 complaining of atypical chest discomfort. Because of his atypical chest pain the patient underwent a myocardial perfusion scan 08/2015 which showed no ischemia and normal EF. It was felt to be a low risk study. Seen again in June 2017 with atypical chest pain and referred for cardiac cath which was unchanged. Other issues include HTN, HLD, GERD, carotid disease and obesity.   On follow up today he presents with symptoms of chest pain.  Now living at [facility]. He was spending time this past week at [facility]. Friday noted a mild pain just medial to his left nipple without radiation. Lasted a few minutes then resolved. Saturday at 5 am he awoke with more chest pain 3/10 in intensity. Radiated more superiorly. Lasted 15 minutes. Noted some skip in heart beat. Since then he has 3-4 additional episodes of pain at rest. Lasts 3-5 minutes. Takes Ntg but isn't sure this really  makes a difference. Pain is worse lying on left side. Not exertional. No associated SOB or diaphoresis.   Past Medical History:  Diagnosis Date  . Borderline diabetes    DIET CONTROLLED  . BPH (benign prostatic hypertrophy)    S/P TURP 1995  . Carotid artery disease ([place])    a. Carotid duplex in 05/2013: 40-98% RICA, <11% LICA. b. Per Patient, doppler 05/2015 showed 1-39% on the R - being folloewd by primary care.  . Cataract of right eye   . Coronary artery disease    a. 2003 s/p PCI/stenting; b. 02/2014 Abnl nuc -> Cath/PCI: LM nl, LAD nl, D2 95p (2.5x12 Promus DES), LCX <20, OM1 patent stent, RCA 50d, EF 55-65%. c. 08/2015: normal nuc.  [name] Dyslipidemia    History of dyslipidemia  . GERD (gastroesophageal reflux disease)   . Hypertension   . Mild depression ([place])   . Osteoarthritis    RIGHT ANKLE, BILATERAL WRIST, BACK  . Reflux OCCASIONAL   TAKES PRILOSEC  . Sinus bradycardia     Past Surgical History:  Procedure Laterality Date  . CARDIAC CATHETERIZATION  02-06-09   NO INTERVENTION  . CARDIAC CATHETERIZATION N/A 05/22/2016   Procedure: Left Heart Cath and Coronary Angiography;  Surgeon: Ladesha Pacini M Martinique, MD;  Location: [place] [facility];  Service: Cardiovascular;  Laterality: N/A;  . CAROTID STENT    . CERVICAL DISCECTOMY  1989   C4 - 5  . CHOLECYSTECTOMY  9147   W/ UMBILICAL HERNIA REPAIR  . CORONARY ANGIOPLASTY  2003- FIRST OBTUSE MARGIANL  VESSEL    X1 CYPHER STENT  . CORONARY STENT PLACEMENT     DES to first diagonal        DR Martinique  . HERNIA REPAIR  1062   Umbilical  . INGUINAL HERNIA REPAIR  1985  . KNEE ARTHROSCOPY  11/05/2011   Procedure: ARTHROSCOPY KNEE;  Surgeon: Gearlean Alf;  Location: [place];  Service: Orthopedics;  Laterality: Right;  RIGHT ARTHROSCOPY KNEE WITH DEBRIDEMENT, Chondroplasty  . LEFT HEART CATHETERIZATION WITH CORONARY ANGIOGRAM N/A 02/10/2014   Procedure: LEFT HEART CATHETERIZATION WITH CORONARY ANGIOGRAM;  Surgeon: Breyer Tejera M  Martinique, MD;  Location: [facility] CATH LAB;  Service: Cardiovascular;  Laterality: N/A;  . LUMBAR LAMINECTOMY  1973   L4-S1  . PILONIDAL CYST EXCISION  1961  . SHOULDER ARTHROSCOPY     Bilateral  . TONSILLECTOMY AND ADENOIDECTOMY  CHILD  . TRANSURETHRAL RESECTION OF PROSTATE  1995     Medications: Current Outpatient Prescriptions  Medication Sig Dispense Refill  . aspirin 81 MG tablet Take 81 mg by mouth daily.     [name] atorvastatin (LIPITOR) 80 MG tablet Take 80 mg by mouth daily.    . calcium carbonate (TUMS - DOSED IN MG ELEMENTAL CALCIUM) 500 MG chewable tablet Chew 1 tablet by mouth daily as needed for indigestion or heartburn.    . clopidogrel (PLAVIX) 75 MG tablet Take 75 mg by mouth daily.     [name] losartan (COZAAR) 25 MG tablet Take 1 tablet (25 mg total) by mouth daily. 90 tablet 2  . metoprolol succinate (TOPROL-XL) 25 MG 24 hr tablet Take 12.5 mg by mouth as directed. 1/2 tablet daily    . Multiple Vitamins-Minerals (MULTIVITAMIN PO) Take 1 tablet by mouth daily.    . nitroGLYCERIN (NITROSTAT) 0.4 MG SL tablet Place 0.4 mg under the tongue every 5 (five) minutes as needed for chest pain (x 3 tablets daily).    . valACYclovir (VALTREX) 1000 MG tablet Take 1,000 mg by mouth as needed. Outbreak.    . vitamin C (ASCORBIC ACID) 500 MG tablet Take 500 mg by mouth daily.    [name] zolpidem (AMBIEN) 5 MG tablet Patient take 1/2 tablet by mouth as needed for sleep     No current facility-administered medications for this visit.     Allergies: Allergies  Allergen Reactions  . Flexeril [Cyclobenzaprine] Swelling  . Methocarbamol Swelling  . Orphenadrine Citrate Swelling  . Ramipril     DRY COUGH    Social History: The patient  reports that he has never smoked. He has never used smokeless tobacco. He reports that he drinks alcohol. He reports that he does not use drugs.   Family History: The patient's family history includes Hypertension in his mother; Lung cancer in his father.   Review of  Systems: Please see the history of present illness. He did go on a Nutrisystem diet and lost weight.   Otherwise, the review of systems is positive for none.   All other systems are reviewed and negative.   Physical Exam: VS:  BP (!) 144/80   Pulse (!) 57   Ht 6\' 4"  (1.93 m)   Wt 243 lb (110.2 kg)   BMI 29.58 kg/m  .  BMI Body mass index is 29.58 kg/m.  Wt Readings from Last 3 Encounters:  04/21/17 243 lb (110.2 kg)  10/16/16 256 lb (116.1 kg)  05/22/16 257 lb (116.6 kg)    General:  He is alert and in no acute distress.  HEENT: Normal.  Neck: Supple, no JVD, carotid bruits, or masses noted.  Cardiac: Regular rate and rhythm. No murmurs, rubs, or gallops. No edema. There is no chest wall or breast tenderness to palpation.  Respiratory:  Lungs are clear to auscultation bilaterally with normal work of breathing.  GI: Soft and nontender.  Skin: Warm and dry. Color is normal.  Neuro:  Strength and sensation are intact and no gross focal deficits noted.  Psych: Alert, appropriate and with normal affect.    LABORATORY DATA:  EKG:  EKG is ordered today. NSR with LVH with QRS widening. No change from June 2017. I have personally reviewed and interpreted this study.   Lab Results  Component Value Date   WBC 5.7 05/21/2016   HGB 14.7 05/21/2016   HCT 43.7 05/21/2016   PLT 200 05/21/2016   GLUCOSE 99 05/21/2016   CHOL 137 09/15/2011   TRIG 77.0 09/15/2011   HDL 47.30 09/15/2011   LDLCALC 74 09/15/2011   ALT 17 03/17/2011   AST 18 03/17/2011   NA 137 05/21/2016   K 4.1 05/21/2016   CL 109 05/21/2016   CREATININE 0.78 05/21/2016   BUN 18 05/21/2016   CO2 22 05/21/2016   INR 1.06 05/21/2016    BNP (last 3 results) No results for input(s): BNP in the last 8760 hours.  ProBNP (last 3 results) No results for input(s): PROBNP in the last 8760 hours.   Other Studies Reviewed Today:  Labs from primary care: 05/27/16 Cholesterol 101, triglycerides 64, HDL 40, LDL 48.  Chemistries, TSH, Hgb are normal.  Procedures    Left Heart Cath and Coronary Angiography 05/2016    Conclusion     Prox RCA lesion, 20% stenosed.  Dist RCA lesion, 50% stenosed.  Ost 1st Mrg to 1st Mrg lesion, 20% stenosed.  The left ventricular systolic function is normal.  1. No obstructive CAD 2. Normal LV function  Plan: continue medical therapy     Assessment/Plan: 1. CAD with first stent in the 1st OM back in 2003; DES to 2nd DX in March 2015. Negative Myoview from 08/2015.  S/p  cardiac cath June 2017 due to chest pain with stable findings.  Now with atypical chest pain/angina. Recommend follow up stress Myoview at this time. If unchanged from 2016 we will reassure. Continue current medical therapy.  2. HTN - BP is well controlled on his current therapy.   3. HLD - on high dose statin  4. Carotid disease with  stable doppler study in July 2017  5. Obesity/deconditioning - encourage increased aerobic activity. Congratulated on 13 lbs weight loss.  Current medicines are reviewed with the patient today.  The patient does not have concerns regarding medicines other than what has been noted above.  The following changes have been made:  See above.  Labs/ tests ordered today include:    Orders Placed This Encounter  Procedures  . Myocardial Perfusion Imaging  . EKG 12-Lead    Signed: Aeric Burnham Martinique MD, [facility]  04/21/2017 8:33 AM  [place]

## 2017-04-20 NOTE — Telephone Encounter (Signed)
Appt scheduled 5-15

## 2017-04-20 NOTE — Telephone Encounter (Signed)
Spoke with pt he states that he has been having chest pain/discomfort since Friday. He has been out of town since Thursday. He states that it was very mild on Friday and he did not have to take his nitro. He states that Saturday chest pain was very concerning and lasted 10-15 minutes, he took nitro x2 and after ~15 minutes this went away but was reminiscent of when he had stents placed 2015 he also had had stents placed in 2002. Now, Sunday he states that chest pain/discomfort was very, very mild only lasting 3-4 minutes and went away with nitro x1. Now, pt states that he is only having a little chest discomfort 1/10 and states that his "heart is skipping" pt states that this is not concerning and states that he does not want to go to the ER as I have directed. He states that he would like Dr Martinique to order stress test to "see how his heart is doing", I informed pt that if he goes to the ER and they think this is warranted they can order it there, pt refuses. Wants Dr Martinique to have his input, will forward message, please advise

## 2017-04-20 NOTE — Telephone Encounter (Signed)
New message      Pt c/o of Chest Pain: STAT if CP now or developed within 24 hours  1. Are you having CP right now?  Not now, but did have discomfort around 7:15 am today 2. Are you experiencing any other symptoms (ex. SOB, nausea, vomiting, sweating)?  no 3. How long have you been experiencing CP?  Started last Friday---off and on all weekend 4. Is your CP continuous or coming and going?  Comes and goes  5. Have you taken Nitroglycerin? ?yes, took 4 nitro over weekend

## 2017-04-20 NOTE — Telephone Encounter (Signed)
I can see him tomorrow at 8:20 am. A meeting I had scheduled before was cancelled.  Peter Martinique MD, [facility]

## 2017-04-20 NOTE — Telephone Encounter (Signed)
Pt notified Appt rescheduled to 820am

## 2017-04-21 ENCOUNTER — Ambulatory Visit: Payer: Medicare Other | Admitting: Cardiology

## 2017-04-21 ENCOUNTER — Ambulatory Visit (INDEPENDENT_AMBULATORY_CARE_PROVIDER_SITE_OTHER): Payer: Medicare Other | Admitting: Cardiology

## 2017-04-21 ENCOUNTER — Encounter: Payer: Self-pay | Admitting: Cardiology

## 2017-04-21 VITALS — BP 144/80 | HR 57 | Ht 76.0 in | Wt 243.0 lb

## 2017-04-21 DIAGNOSIS — E785 Hyperlipidemia, unspecified: Secondary | ICD-10-CM

## 2017-04-21 DIAGNOSIS — I209 Angina pectoris, unspecified: Secondary | ICD-10-CM

## 2017-04-21 DIAGNOSIS — I10 Essential (primary) hypertension: Secondary | ICD-10-CM

## 2017-04-21 DIAGNOSIS — Z9861 Coronary angioplasty status: Secondary | ICD-10-CM | POA: Diagnosis not present

## 2017-04-21 DIAGNOSIS — I2510 Atherosclerotic heart disease of native coronary artery without angina pectoris: Secondary | ICD-10-CM

## 2017-04-21 NOTE — Patient Instructions (Signed)
We will schedule you for a nuclear stress test  Continue your current therapy   

## 2017-04-22 ENCOUNTER — Telehealth (HOSPITAL_COMMUNITY): Payer: Self-pay

## 2017-04-22 NOTE — Telephone Encounter (Signed)
Encounter complete. 

## 2017-04-23 ENCOUNTER — Telehealth: Payer: Self-pay | Admitting: Cardiology

## 2017-04-23 ENCOUNTER — Ambulatory Visit (HOSPITAL_COMMUNITY)
Admission: RE | Admit: 2017-04-23 | Discharge: 2017-04-23 | Disposition: A | Discharge status: Home / Self Care | Payer: Medicare Other | Source: Ambulatory Visit | Attending: Cardiology | Admitting: Cardiology

## 2017-04-23 DIAGNOSIS — R079 Chest pain, unspecified: Secondary | ICD-10-CM | POA: Insufficient documentation

## 2017-04-23 DIAGNOSIS — I10 Essential (primary) hypertension: Secondary | ICD-10-CM

## 2017-04-23 DIAGNOSIS — I209 Angina pectoris, unspecified: Secondary | ICD-10-CM | POA: Diagnosis not present

## 2017-04-23 DIAGNOSIS — E785 Hyperlipidemia, unspecified: Secondary | ICD-10-CM | POA: Diagnosis not present

## 2017-04-23 DIAGNOSIS — R5383 Other fatigue: Secondary | ICD-10-CM | POA: Insufficient documentation

## 2017-04-23 DIAGNOSIS — R9439 Abnormal result of other cardiovascular function study: Secondary | ICD-10-CM | POA: Diagnosis not present

## 2017-04-23 DIAGNOSIS — E119 Type 2 diabetes mellitus without complications: Secondary | ICD-10-CM | POA: Insufficient documentation

## 2017-04-23 DIAGNOSIS — I2510 Atherosclerotic heart disease of native coronary artery without angina pectoris: Secondary | ICD-10-CM | POA: Insufficient documentation

## 2017-04-23 DIAGNOSIS — Z9861 Coronary angioplasty status: Secondary | ICD-10-CM

## 2017-04-23 DIAGNOSIS — I6523 Occlusion and stenosis of bilateral carotid arteries: Secondary | ICD-10-CM | POA: Diagnosis not present

## 2017-04-23 LAB — MYOCARDIAL PERFUSION IMAGING
CHL CUP NUCLEAR SDS: 2
CHL CUP NUCLEAR SRS: 4
CHL CUP NUCLEAR SSS: 6
CSEPPHR: 78 {beats}/min
LV dias vol: 125 mL (ref 62–150)
LV sys vol: 66 mL
Rest HR: 60 {beats}/min
TID: 1.16

## 2017-04-23 MED ORDER — TECHNETIUM TC 99M TETROFOSMIN IV KIT
31.0000 | PACK | Freq: Once | INTRAVENOUS | Status: AC | PRN
  Administered 2017-04-23: 31 via INTRAVENOUS
  Filled 2017-04-23: qty 31

## 2017-04-23 MED ORDER — REGADENOSON 0.4 MG/5ML IV SOLN
0.4000 mg | Freq: Once | INTRAVENOUS | Status: AC
  Administered 2017-04-23: 0.4 mg via INTRAVENOUS

## 2017-04-23 MED ORDER — AMINOPHYLLINE 25 MG/ML IV SOLN
75.0000 mg | Freq: Once | INTRAVENOUS | Status: AC
  Administered 2017-04-23: 75 mg via INTRAVENOUS

## 2017-04-23 MED ORDER — TECHNETIUM TC 99M TETROFOSMIN IV KIT
10.0000 | PACK | Freq: Once | INTRAVENOUS | Status: AC | PRN
  Administered 2017-04-23: 10 via INTRAVENOUS
  Filled 2017-04-23: qty 10

## 2017-04-23 NOTE — Telephone Encounter (Signed)
New message    Pt is calling to find out when he will hear about the results from his myocardial perfusion done today.

## 2017-04-23 NOTE — Telephone Encounter (Signed)
Returned call to patient.Advised myoview results not available.I will call you back after Dr.Jordan reviews.

## 2017-04-24 ENCOUNTER — Other Ambulatory Visit: Payer: Self-pay

## 2017-04-24 ENCOUNTER — Other Ambulatory Visit: Payer: Self-pay | Admitting: Cardiology

## 2017-04-24 DIAGNOSIS — I2510 Atherosclerotic heart disease of native coronary artery without angina pectoris: Secondary | ICD-10-CM

## 2017-04-24 DIAGNOSIS — Z9861 Coronary angioplasty status: Principal | ICD-10-CM

## 2017-04-24 DIAGNOSIS — R9439 Abnormal result of other cardiovascular function study: Secondary | ICD-10-CM

## 2017-04-24 NOTE — Telephone Encounter (Signed)
Returned call to patient Dr.Jordan called him earlier today.Cardiac cath scheduled 05/01/17.He will come to office Monday 04/27/17, I will go over cath instructions and he will have pre cath labs.

## 2017-04-27 ENCOUNTER — Ambulatory Visit
Admission: RE | Admit: 2017-04-27 | Discharge: 2017-04-27 | Disposition: A | Discharge status: Home / Self Care | Payer: Medicare Other | Source: Ambulatory Visit | Attending: Cardiology | Admitting: Cardiology

## 2017-04-27 DIAGNOSIS — Z01818 Encounter for other preprocedural examination: Secondary | ICD-10-CM | POA: Diagnosis not present

## 2017-04-27 DIAGNOSIS — Z9861 Coronary angioplasty status: Principal | ICD-10-CM

## 2017-04-27 DIAGNOSIS — I2510 Atherosclerotic heart disease of native coronary artery without angina pectoris: Secondary | ICD-10-CM

## 2017-04-27 LAB — CBC WITH DIFFERENTIAL/PLATELET
Basophils Absolute: 0 x10E3/uL (ref 0.0–0.2)
Basos: 0 %
EOS (ABSOLUTE): 0.1 x10E3/uL (ref 0.0–0.4)
Eos: 3 %
Hematocrit: 44 % (ref 37.5–51.0)
Hemoglobin: 15.2 g/dL (ref 13.0–17.7)
Immature Grans (Abs): 0 x10E3/uL (ref 0.0–0.1)
Immature Granulocytes: 0 %
Lymphocytes Absolute: 1.5 x10E3/uL (ref 0.7–3.1)
Lymphs: 29 %
MCH: 31.7 pg (ref 26.6–33.0)
MCHC: 34.5 g/dL (ref 31.5–35.7)
MCV: 92 fL (ref 79–97)
Monocytes Absolute: 0.4 x10E3/uL (ref 0.1–0.9)
Monocytes: 8 %
Neutrophils Absolute: 3.1 x10E3/uL (ref 1.4–7.0)
Neutrophils: 60 %
Platelets: 196 x10E3/uL (ref 150–379)
RBC: 4.79 x10E6/uL (ref 4.14–5.80)
RDW: 13.7 % (ref 12.3–15.4)
WBC: 5.2 x10E3/uL (ref 3.4–10.8)

## 2017-04-27 LAB — BASIC METABOLIC PANEL
BUN/Creatinine Ratio: 18 (ref 10–24)
BUN: 15 mg/dL (ref 8–27)
CALCIUM: 10 mg/dL (ref 8.6–10.2)
CHLORIDE: 104 mmol/L (ref 96–106)
CO2: 23 mmol/L (ref 18–29)
Creatinine, Ser: 0.85 mg/dL (ref 0.76–1.27)
GFR, EST AFRICAN AMERICAN: 96 mL/min/{1.73_m2} (ref >59)
GFR, EST NON AFRICAN AMERICAN: 83 mL/min/{1.73_m2} (ref >59)
Glucose: 99 mg/dL (ref 65–99)
POTASSIUM: 4.7 mmol/L (ref 3.5–5.2)
Sodium: 138 mmol/L (ref 134–144)

## 2017-04-28 LAB — PROTIME-INR
INR: 1 (ref 0.8–1.2)
Prothrombin Time: 10.2 s (ref 9.1–12.0)

## 2017-05-01 ENCOUNTER — Ambulatory Visit (HOSPITAL_COMMUNITY)
Admission: RE | Admit: 2017-05-01 | Discharge: 2017-05-01 | Disposition: A | Discharge status: Home / Self Care | Payer: Medicare Other | Source: Ambulatory Visit | Attending: Cardiology | Admitting: Cardiology

## 2017-05-01 ENCOUNTER — Encounter (HOSPITAL_COMMUNITY)
Admission: RE | Disposition: A | Discharge status: Home / Self Care | Payer: Self-pay | Source: Ambulatory Visit | Attending: Cardiology

## 2017-05-01 DIAGNOSIS — I119 Hypertensive heart disease without heart failure: Secondary | ICD-10-CM | POA: Diagnosis present

## 2017-05-01 DIAGNOSIS — F329 Major depressive disorder, single episode, unspecified: Secondary | ICD-10-CM | POA: Insufficient documentation

## 2017-05-01 DIAGNOSIS — K219 Gastro-esophageal reflux disease without esophagitis: Secondary | ICD-10-CM | POA: Diagnosis not present

## 2017-05-01 DIAGNOSIS — R7303 Prediabetes: Secondary | ICD-10-CM | POA: Diagnosis not present

## 2017-05-01 DIAGNOSIS — Z955 Presence of coronary angioplasty implant and graft: Secondary | ICD-10-CM | POA: Insufficient documentation

## 2017-05-01 DIAGNOSIS — N400 Enlarged prostate without lower urinary tract symptoms: Secondary | ICD-10-CM | POA: Insufficient documentation

## 2017-05-01 DIAGNOSIS — R0789 Other chest pain: Secondary | ICD-10-CM | POA: Diagnosis not present

## 2017-05-01 DIAGNOSIS — R9439 Abnormal result of other cardiovascular function study: Secondary | ICD-10-CM | POA: Diagnosis present

## 2017-05-01 DIAGNOSIS — Z9861 Coronary angioplasty status: Secondary | ICD-10-CM

## 2017-05-01 DIAGNOSIS — E669 Obesity, unspecified: Secondary | ICD-10-CM | POA: Insufficient documentation

## 2017-05-01 DIAGNOSIS — Z7982 Long term (current) use of aspirin: Secondary | ICD-10-CM | POA: Diagnosis not present

## 2017-05-01 DIAGNOSIS — I2510 Atherosclerotic heart disease of native coronary artery without angina pectoris: Secondary | ICD-10-CM | POA: Insufficient documentation

## 2017-05-01 DIAGNOSIS — E785 Hyperlipidemia, unspecified: Secondary | ICD-10-CM | POA: Diagnosis not present

## 2017-05-01 DIAGNOSIS — Z6829 Body mass index (BMI) 29.0-29.9, adult: Secondary | ICD-10-CM | POA: Diagnosis not present

## 2017-05-01 DIAGNOSIS — I259 Chronic ischemic heart disease, unspecified: Secondary | ICD-10-CM | POA: Diagnosis present

## 2017-05-01 DIAGNOSIS — I10 Essential (primary) hypertension: Secondary | ICD-10-CM | POA: Diagnosis not present

## 2017-05-01 DIAGNOSIS — Z7902 Long term (current) use of antithrombotics/antiplatelets: Secondary | ICD-10-CM | POA: Insufficient documentation

## 2017-05-01 DIAGNOSIS — I6521 Occlusion and stenosis of right carotid artery: Secondary | ICD-10-CM | POA: Insufficient documentation

## 2017-05-01 DIAGNOSIS — R079 Chest pain, unspecified: Secondary | ICD-10-CM | POA: Diagnosis present

## 2017-05-01 DIAGNOSIS — M1990 Unspecified osteoarthritis, unspecified site: Secondary | ICD-10-CM | POA: Insufficient documentation

## 2017-05-01 HISTORY — PX: LEFT HEART CATH AND CORONARY ANGIOGRAPHY: CATH118249

## 2017-05-01 SURGERY — LEFT HEART CATH AND CORONARY ANGIOGRAPHY
Anesthesia: LOCAL

## 2017-05-01 MED ORDER — FENTANYL CITRATE (PF) 100 MCG/2ML IJ SOLN
INTRAMUSCULAR | Status: AC
  Filled 2017-05-01: qty 2

## 2017-05-01 MED ORDER — LIDOCAINE HCL 1 % IJ SOLN
INTRAMUSCULAR | Status: AC
  Filled 2017-05-01: qty 20

## 2017-05-01 MED ORDER — SODIUM CHLORIDE 0.9% FLUSH: 3.0000 mL | INTRAVENOUS | Status: DC | PRN

## 2017-05-01 MED ORDER — HEPARIN SODIUM (PORCINE) 1000 UNIT/ML IJ SOLN
INTRAMUSCULAR | Status: AC
  Filled 2017-05-01: qty 1

## 2017-05-01 MED ORDER — ASPIRIN 81 MG PO CHEW: 81.0000 mg | CHEWABLE_TABLET | ORAL | Status: DC

## 2017-05-01 MED ORDER — SODIUM CHLORIDE 0.9 % IV SOLN: 250.0000 mL | INTRAVENOUS | Status: DC | PRN

## 2017-05-01 MED ORDER — SODIUM CHLORIDE 0.9% FLUSH: 3.0000 mL | Freq: Two times a day (BID) | INTRAVENOUS | Status: DC

## 2017-05-01 MED ORDER — SODIUM CHLORIDE 0.9 % WEIGHT BASED INFUSION
3.0000 mL/kg/h | INTRAVENOUS | Status: DC
  Administered 2017-05-01: 3 mL/kg/h via INTRAVENOUS

## 2017-05-01 MED ORDER — MIDAZOLAM HCL 2 MG/2ML IJ SOLN
INTRAMUSCULAR | Status: DC | PRN
  Administered 2017-05-01: 1 mg via INTRAVENOUS

## 2017-05-01 MED ORDER — HEPARIN (PORCINE) IN NACL 2-0.9 UNIT/ML-% IJ SOLN
INTRAMUSCULAR | Status: AC
Start: 2017-05-01 — End: ?
  Filled 2017-05-01: qty 1000

## 2017-05-01 MED ORDER — MIDAZOLAM HCL 2 MG/2ML IJ SOLN
INTRAMUSCULAR | Status: AC
  Filled 2017-05-01: qty 2

## 2017-05-01 MED ORDER — SODIUM CHLORIDE 0.9 % WEIGHT BASED INFUSION: 1.0000 mL/kg/h | INTRAVENOUS | Status: DC

## 2017-05-01 MED ORDER — SODIUM CHLORIDE 0.9 % WEIGHT BASED INFUSION: 1.0000 mL/kg/h | INTRAVENOUS | Status: AC

## 2017-05-01 MED ORDER — HEPARIN SODIUM (PORCINE) 1000 UNIT/ML IJ SOLN
INTRAMUSCULAR | Status: DC | PRN
  Administered 2017-05-01: 5500 [IU] via INTRAVENOUS

## 2017-05-01 MED ORDER — IOPAMIDOL (ISOVUE-370) INJECTION 76%
INTRAVENOUS | Status: AC
  Filled 2017-05-01: qty 100

## 2017-05-01 MED ORDER — VERAPAMIL HCL 2.5 MG/ML IV SOLN
INTRAVENOUS | Status: AC
  Filled 2017-05-01: qty 2

## 2017-05-01 MED ORDER — ACETAMINOPHEN 325 MG PO TABS
ORAL_TABLET | ORAL | Status: AC
  Filled 2017-05-01: qty 2

## 2017-05-01 MED ORDER — ACETAMINOPHEN 325 MG PO TABS
650.0000 mg | ORAL_TABLET | Freq: Once | ORAL | Status: AC
  Administered 2017-05-01: 650 mg via ORAL

## 2017-05-01 MED ORDER — LIDOCAINE HCL (PF) 1 % IJ SOLN
INTRAMUSCULAR | Status: DC | PRN
  Administered 2017-05-01: 2 mL

## 2017-05-01 MED ORDER — HEPARIN (PORCINE) IN NACL 2-0.9 UNIT/ML-% IJ SOLN
INTRAMUSCULAR | Status: AC | PRN
  Administered 2017-05-01: 1000 mL via INTRA_ARTERIAL

## 2017-05-01 MED ORDER — HEPARIN (PORCINE) IN NACL 2-0.9 UNIT/ML-% IJ SOLN
INTRAMUSCULAR | Status: DC | PRN
  Administered 2017-05-01: 10 mL via INTRA_ARTERIAL

## 2017-05-01 MED ORDER — FENTANYL CITRATE (PF) 100 MCG/2ML IJ SOLN
INTRAMUSCULAR | Status: DC | PRN
  Administered 2017-05-01: 25 ug via INTRAVENOUS

## 2017-05-01 MED ORDER — IOPAMIDOL (ISOVUE-370) INJECTION 76%
INTRAVENOUS | Status: DC | PRN
  Administered 2017-05-01: 100 mL via INTRA_ARTERIAL

## 2017-05-01 SURGICAL SUPPLY — 13 items
CATH INFINITI 5 FR JL3.5 (CATHETERS) ×2 IMPLANT
CATH INFINITI 5FR ANG PIGTAIL (CATHETERS) ×2 IMPLANT
CATH INFINITI JR4 5F (CATHETERS) ×2 IMPLANT
DEVICE RAD COMP TR BAND LRG (VASCULAR PRODUCTS) ×2 IMPLANT
GLIDESHEATH SLEND SS 6F .021 (SHEATH) ×2 IMPLANT
GUIDEWIRE INQWIRE 1.5J.035X260 (WIRE) ×1 IMPLANT
INQWIRE 1.5J .035X260CM (WIRE) ×2
KIT HEART LEFT (KITS) ×2 IMPLANT
PACK CARDIAC CATHETERIZATION (CUSTOM PROCEDURE TRAY) ×2 IMPLANT
SYR MEDRAD MARK V 150ML (SYRINGE) ×2 IMPLANT
TRANSDUCER W/STOPCOCK (MISCELLANEOUS) ×2 IMPLANT
TUBING CIL FLEX 10 FLL-RA (TUBING) ×2 IMPLANT
WIRE EMERALD ST .035X260CM (WIRE) ×2 IMPLANT

## 2017-05-01 NOTE — Interval H&P Note (Signed)
History and Physical Interval Note:  05/01/2017 10:57 AM  [name]  has presented today for surgery, with the diagnosis of abnormal myoview  The various methods of treatment have been discussed with the patient and family. After consideration of risks, benefits and other options for treatment, the patient has consented to  Procedure(s): Left Heart Cath and Coronary Angiography (N/A) as a surgical intervention .  The patient's history has been reviewed, patient examined, no change in status, stable for surgery.  I have reviewed the patient's chart and labs.  Questions were answered to the patient's satisfaction.    Cath Lab Visit (complete for each Cath Lab visit)  Clinical Evaluation Leading to the Procedure:   ACS: No.  Non-ACS:    Anginal Classification: CCS III  Anti-ischemic medical therapy: Minimal Therapy (1 class of medications)  Non-Invasive Test Results: Intermediate-risk stress test findings: cardiac mortality 1-3%/year  Prior CABG: No previous CABG       [name] [facility] 05/01/2017 10:57 AM

## 2017-05-01 NOTE — H&P (View-Only) (Signed)
CARDIOLOGY OFFICE NOTE  Date:  04/21/2017    Jared Clark Date of Birth: 1936/06/12 Medical Record #[id number]  PCP:  Marton Redwood, MD  Cardiologist:  Stephannie Broner Martinique  MD  Chief Complaint  Patient presents with  . Chest Pain    History of Present Illness: Jared Clark is a 81 y.o. male who presents today for evaluation of recent chest pain.    He has known CAD. In September of 2003 he underwent stenting of his first obtuse marginal vessel. He had a repeat cardiac catheterization in March 2010 after an abnormal stress Cardiolite. The cardiac catheterization showed long term patency of the prior stent and nonobstructive atherosclerotic disease elsewhere and he had normal left ventricular function. Seen as a work in on 01/25/14 because of recurring chest pain. He had LexiScan Myoview. This showed significant reversible anterolateral ischemia. On 02/10/14 the patient underwent cardiac catheterization with successful stenting of the second diagonal with a drug-eluting stent with Dr. Martinique. He was seen in September 2016 complaining of atypical chest discomfort. Because of his atypical chest pain the patient underwent a myocardial perfusion scan 08/2015 which showed no ischemia and normal EF. It was felt to be a low risk study. Seen again in June 2017 with atypical chest pain and referred for cardiac cath which was unchanged. Other issues include HTN, HLD, GERD, carotid disease and obesity.   On follow up today he presents with symptoms of chest pain.  Now living at [facility]. He was spending time this past week at [facility]. Friday noted a mild pain just medial to his left nipple without radiation. Lasted a few minutes then resolved. Saturday at 5 am he awoke with more chest pain 3/10 in intensity. Radiated more superiorly. Lasted 15 minutes. Noted some skip in heart beat. Since then he has 3-4 additional episodes of pain at rest. Lasts 3-5 minutes. Takes Ntg but isn't sure this really  makes a difference. Pain is worse lying on left side. Not exertional. No associated SOB or diaphoresis.   Past Medical History:  Diagnosis Date  . Borderline diabetes    DIET CONTROLLED  . BPH (benign prostatic hypertrophy)    S/P TURP 1995  . Carotid artery disease ([place])    a. Carotid duplex in 05/2013: 74-08% RICA, <14% LICA. b. Per Patient, doppler 05/2015 showed 1-39% on the R - being folloewd by primary care.  . Cataract of right eye   . Coronary artery disease    a. 2003 s/p PCI/stenting; b. 02/2014 Abnl nuc -> Cath/PCI: LM nl, LAD nl, D2 95p (2.5x12 Promus DES), LCX <20, OM1 patent stent, RCA 50d, EF 55-65%. c. 08/2015: normal nuc.  [name] Dyslipidemia    History of dyslipidemia  . GERD (gastroesophageal reflux disease)   . Hypertension   . Mild depression ([place])   . Osteoarthritis    RIGHT ANKLE, BILATERAL WRIST, BACK  . Reflux OCCASIONAL   TAKES PRILOSEC  . Sinus bradycardia     Past Surgical History:  Procedure Laterality Date  . CARDIAC CATHETERIZATION  02-06-09   NO INTERVENTION  . CARDIAC CATHETERIZATION N/A 05/22/2016   Procedure: Left Heart Cath and Coronary Angiography;  Surgeon: Pride Gonzales M Martinique, MD;  Location: [place] CV [facility];  Service: Cardiovascular;  Laterality: N/A;  . CAROTID STENT    . CERVICAL DISCECTOMY  1989   C4 - 5  . CHOLECYSTECTOMY  4818   W/ UMBILICAL HERNIA REPAIR  . CORONARY ANGIOPLASTY  2003- FIRST OBTUSE MARGIANL  VESSEL    X1 CYPHER STENT  . CORONARY STENT PLACEMENT     DES to first diagonal        DR Martinique  . HERNIA REPAIR  3846   Umbilical  . INGUINAL HERNIA REPAIR  1985  . KNEE ARTHROSCOPY  11/05/2011   Procedure: ARTHROSCOPY KNEE;  Surgeon: Gearlean Alf;  Location: [place];  Service: Orthopedics;  Laterality: Right;  RIGHT ARTHROSCOPY KNEE WITH DEBRIDEMENT, Chondroplasty  . LEFT HEART CATHETERIZATION WITH CORONARY ANGIOGRAM N/A 02/10/2014   Procedure: LEFT HEART CATHETERIZATION WITH CORONARY ANGIOGRAM;  Surgeon: Kesley Gaffey M  Martinique, MD;  Location: [facility] CATH LAB;  Service: Cardiovascular;  Laterality: N/A;  . LUMBAR LAMINECTOMY  1973   L4-S1  . PILONIDAL CYST EXCISION  1961  . SHOULDER ARTHROSCOPY     Bilateral  . TONSILLECTOMY AND ADENOIDECTOMY  CHILD  . TRANSURETHRAL RESECTION OF PROSTATE  1995     Medications: Current Outpatient Prescriptions  Medication Sig Dispense Refill  . aspirin 81 MG tablet Take 81 mg by mouth daily.     [name] atorvastatin (LIPITOR) 80 MG tablet Take 80 mg by mouth daily.    . calcium carbonate (TUMS - DOSED IN MG ELEMENTAL CALCIUM) 500 MG chewable tablet Chew 1 tablet by mouth daily as needed for indigestion or heartburn.    . clopidogrel (PLAVIX) 75 MG tablet Take 75 mg by mouth daily.     [name] losartan (COZAAR) 25 MG tablet Take 1 tablet (25 mg total) by mouth daily. 90 tablet 2  . metoprolol succinate (TOPROL-XL) 25 MG 24 hr tablet Take 12.5 mg by mouth as directed. 1/2 tablet daily    . Multiple Vitamins-Minerals (MULTIVITAMIN PO) Take 1 tablet by mouth daily.    . nitroGLYCERIN (NITROSTAT) 0.4 MG SL tablet Place 0.4 mg under the tongue every 5 (five) minutes as needed for chest pain (x 3 tablets daily).    . valACYclovir (VALTREX) 1000 MG tablet Take 1,000 mg by mouth as needed. Outbreak.    . vitamin C (ASCORBIC ACID) 500 MG tablet Take 500 mg by mouth daily.    [name] zolpidem (AMBIEN) 5 MG tablet Patient take 1/2 tablet by mouth as needed for sleep     No current facility-administered medications for this visit.     Allergies: Allergies  Allergen Reactions  . Flexeril [Cyclobenzaprine] Swelling  . Methocarbamol Swelling  . Orphenadrine Citrate Swelling  . Ramipril     DRY COUGH    Social History: The patient  reports that he has never smoked. He has never used smokeless tobacco. He reports that he drinks alcohol. He reports that he does not use drugs.   Family History: The patient's family history includes Hypertension in his mother; Lung cancer in his father.   Review of  Systems: Please see the history of present illness. He did go on a Nutrisystem diet and lost weight.   Otherwise, the review of systems is positive for none.   All other systems are reviewed and negative.   Physical Exam: VS:  BP (!) 144/80   Pulse (!) 57   Ht 6\' 4"  (1.93 m)   Wt 243 lb (110.2 kg)   BMI 29.58 kg/m  .  BMI Body mass index is 29.58 kg/m.  Wt Readings from Last 3 Encounters:  04/21/17 243 lb (110.2 kg)  10/16/16 256 lb (116.1 kg)  05/22/16 257 lb (116.6 kg)    General:  He is alert and in no acute distress.  HEENT: Normal.  Neck: Supple, no JVD, carotid bruits, or masses noted.  Cardiac: Regular rate and rhythm. No murmurs, rubs, or gallops. No edema. There is no chest wall or breast tenderness to palpation.  Respiratory:  Lungs are clear to auscultation bilaterally with normal work of breathing.  GI: Soft and nontender.  Skin: Warm and dry. Color is normal.  Neuro:  Strength and sensation are intact and no gross focal deficits noted.  Psych: Alert, appropriate and with normal affect.    LABORATORY DATA:  EKG:  EKG is ordered today. NSR with LVH with QRS widening. No change from June 2017. I have personally reviewed and interpreted this study.   Lab Results  Component Value Date   WBC 5.7 05/21/2016   HGB 14.7 05/21/2016   HCT 43.7 05/21/2016   PLT 200 05/21/2016   GLUCOSE 99 05/21/2016   CHOL 137 09/15/2011   TRIG 77.0 09/15/2011   HDL 47.30 09/15/2011   LDLCALC 74 09/15/2011   ALT 17 03/17/2011   AST 18 03/17/2011   NA 137 05/21/2016   K 4.1 05/21/2016   CL 109 05/21/2016   CREATININE 0.78 05/21/2016   BUN 18 05/21/2016   CO2 22 05/21/2016   INR 1.06 05/21/2016    BNP (last 3 results) No results for input(s): BNP in the last 8760 hours.  ProBNP (last 3 results) No results for input(s): PROBNP in the last 8760 hours.   Other Studies Reviewed Today:  Labs from primary care: 05/27/16 Cholesterol 101, triglycerides 64, HDL 40, LDL 48.  Chemistries, TSH, Hgb are normal.  Procedures    Left Heart Cath and Coronary Angiography 05/2016    Conclusion     Prox RCA lesion, 20% stenosed.  Dist RCA lesion, 50% stenosed.  Ost 1st Mrg to 1st Mrg lesion, 20% stenosed.  The left ventricular systolic function is normal.  1. No obstructive CAD 2. Normal LV function  Plan: continue medical therapy     Assessment/Plan: 1. CAD with first stent in the 1st OM back in 2003; DES to 2nd DX in March 2015. Negative Myoview from 08/2015.  S/p  cardiac cath June 2017 due to chest pain with stable findings.  Now with atypical chest pain/angina. Recommend follow up stress Myoview at this time. If unchanged from 2016 we will reassure. Continue current medical therapy.  2. HTN - BP is well controlled on his current therapy.   3. HLD - on high dose statin  4. Carotid disease with  stable doppler study in July 2017  5. Obesity/deconditioning - encourage increased aerobic activity. Congratulated on 13 lbs weight loss.  Current medicines are reviewed with the patient today.  The patient does not have concerns regarding medicines other than what has been noted above.  The following changes have been made:  See above.  Labs/ tests ordered today include:    Orders Placed This Encounter  Procedures  . Myocardial Perfusion Imaging  . EKG 12-Lead    Signed: Liviya Santini Martinique MD, [facility]  04/21/2017 8:33 AM  [place]

## 2017-05-01 NOTE — Discharge Instructions (Signed)
Radial Site Care °Refer to this sheet in the next few weeks. These instructions provide you with information about caring for yourself after your procedure. Your health care provider may also give you more specific instructions. Your treatment has been planned according to current medical practices, but problems sometimes occur. Call your health care provider if you have any problems or questions after your procedure. °What can I expect after the procedure? °After your procedure, it is typical to have the following: °· Bruising at the radial site that usually fades within 1-2 weeks. °· Blood collecting in the tissue (hematoma) that may be painful to the touch. It should usually decrease in size and tenderness within 1-2 weeks. °Follow these instructions at home: °· Take medicines only as directed by your health care provider. °· You may shower 24-48 hours after the procedure or as directed by your health care provider. Remove the bandage (dressing) and gently wash the site with plain soap and water. Pat the area dry with a clean towel. Do not rub the site, because this may cause bleeding. °· Do not take baths, swim, or use a hot tub until your health care provider approves. °· Check your insertion site every day for redness, swelling, or drainage. °· Do not apply powder or lotion to the site. °· Do not flex or bend the affected arm for 24 hours or as directed by your health care provider. °· Do not push or pull heavy objects with the affected arm for 24 hours or as directed by your health care provider. °· Do not lift over 10 lb (4.5 kg) for 5 days after your procedure or as directed by your health care provider. °· Ask your health care provider when it is okay to: °¨ Return to work or school. °¨ Resume usual physical activities or sports. °¨ Resume sexual activity. °· Do not drive home if you are discharged the same day as the procedure. Have someone else drive you. °· You may drive 24 hours after the procedure  unless otherwise instructed by your health care provider. °· Do not operate machinery or power tools for 24 hours after the procedure. °· If your procedure was done as an outpatient procedure, which means that you went home the same day as your procedure, a responsible adult should be with you for the first 24 hours after you arrive home. °· Keep all follow-up visits as directed by your health care provider. This is important. °Contact a health care provider if: °· You have a fever. °· You have chills. °· You have increased bleeding from the radial site. Hold pressure on the site. °Get help right away if: °· You have unusual pain at the radial site. °· You have redness, warmth, or swelling at the radial site. °· You have drainage (other than a small amount of blood on the dressing) from the radial site. °· The radial site is bleeding, and the bleeding does not stop after 30 minutes of holding steady pressure on the site. °· Your arm or hand becomes pale, cool, tingly, or numb. °This information is not intended to replace advice given to you by your health care provider. Make sure you discuss any questions you have with your health care provider. °Document Released: 12/27/2010 Document Revised: 05/01/2016 Document Reviewed: 06/12/2014 °Elsevier Interactive Patient Education © 2017 [employer] ° °

## 2017-05-05 ENCOUNTER — Telehealth: Payer: Self-pay

## 2017-05-05 ENCOUNTER — Encounter (HOSPITAL_COMMUNITY): Payer: Self-pay | Admitting: Cardiology

## 2017-05-05 NOTE — Telephone Encounter (Signed)
Post-cath call placed to Pt.  Pt identified with name and DOB.  Asked Pt how his wrist is healing.  Pt states minimal drainage, Pt covered with gauze and bandaid.  Pt denies any s/s of infection or frank blood.  Pt requested copy of cath report.  Spoke with MR.  Per MR, Pt may pick up copy of cath report at office.  Pt indicates understanding.  Verified follow up appt with Dr. Martinique.

## 2017-05-19 DIAGNOSIS — H52203 Unspecified astigmatism, bilateral: Secondary | ICD-10-CM | POA: Diagnosis not present

## 2017-05-19 DIAGNOSIS — H26491 Other secondary cataract, right eye: Secondary | ICD-10-CM | POA: Diagnosis not present

## 2017-05-20 NOTE — Progress Notes (Signed)
CARDIOLOGY OFFICE NOTE  Date:  05/21/2017    Jared Clark Date of Birth: Apr 07, 1936 Medical Record #[id number]  PCP:  Marton Redwood, MD  Cardiologist:  Peter Martinique  MD  Chief Complaint  Patient presents with  . Chest Pain    History of Present Illness: Jared Clark is a 81 y.o. male who presents for follow up post cardiac cath.  He has known CAD. In September of 2003 he underwent stenting of his first obtuse marginal vessel. He had a repeat cardiac catheterization in March 2010 after an abnormal stress Cardiolite. The cardiac catheterization showed long term patency of the prior stent and nonobstructive atherosclerotic disease elsewhere and he had normal left ventricular function. Seen as a work in on 01/25/14 because of recurring chest pain. He had LexiScan Myoview. This showed significant reversible anterolateral ischemia. On 02/10/14 the patient underwent cardiac catheterization with successful stenting of the second diagonal with a drug-eluting stent with Dr. Martinique. He was seen in September 2016 complaining of atypical chest discomfort. Because of his atypical chest pain the patient underwent a myocardial perfusion scan 08/2015 which showed no ischemia and normal EF. It was felt to be a low risk study. Seen again in June 2017 with atypical chest pain and referred for cardiac cath which was unchanged. Other issues include HTN, HLD, GERD, carotid disease and obesity.   Seen recently with symptoms of chest pain. Myoview study showed a small inferior and apical area of ischemia. EF lower at 47%. We recommended repeat cardiac cath. This showed nonobstructive CAD that was unchanged from prior studies. LV function was normal. EDP elevated 26-27 mm Hg. Medical management recommended. Since then he has been taking prilosec every other day and no more chest pain.[name] He does describe swelling at times particularly at night if he has eaten something salty.  Past Medical History:  Diagnosis  Date  . Borderline diabetes    DIET CONTROLLED  . BPH (benign prostatic hypertrophy)    S/P TURP 1995  . Carotid artery disease ([place])    a. Carotid duplex in 05/2013: 76-54% RICA, <65% LICA. b. Per Patient, doppler 05/2015 showed 1-39% on the R - being folloewd by primary care.  . Cataract of right eye   . Coronary artery disease    a. 2003 s/p PCI/stenting; b. 02/2014 Abnl nuc -> Cath/PCI: LM nl, LAD nl, D2 95p (2.5x12 Promus DES), LCX <20, OM1 patent stent, RCA 50d, EF 55-65%. c. 08/2015: normal nuc.  [name] Dyslipidemia    History of dyslipidemia  . GERD (gastroesophageal reflux disease)   . Hypertension   . Mild depression ([place])   . Osteoarthritis    RIGHT ANKLE, BILATERAL WRIST, BACK  . Reflux OCCASIONAL   TAKES PRILOSEC  . Sinus bradycardia     Past Surgical History:  Procedure Laterality Date  . CARDIAC CATHETERIZATION  02-06-09   NO INTERVENTION  . CARDIAC CATHETERIZATION N/A 05/22/2016   Procedure: Left Heart Cath and Coronary Angiography;  Surgeon: Peter M Martinique, MD;  Location: [facility];  Service: Cardiovascular;  Laterality: N/A;  . CAROTID STENT    . CERVICAL DISCECTOMY  1989   C4 - 5  . CHOLECYSTECTOMY  0354   W/ UMBILICAL HERNIA REPAIR  . CORONARY ANGIOPLASTY  2003- FIRST OBTUSE MARGIANL VESSEL    X1 CYPHER STENT  . CORONARY STENT PLACEMENT     DES to first diagonal        DR Martinique  . HERNIA REPAIR  8182   Umbilical  . INGUINAL HERNIA REPAIR  1985  . KNEE ARTHROSCOPY  11/05/2011   Procedure: ARTHROSCOPY KNEE;  Surgeon: Gearlean Alf;  Location: [facility];  Service: Orthopedics;  Laterality: Right;  RIGHT ARTHROSCOPY KNEE WITH DEBRIDEMENT, Chondroplasty  . LEFT HEART CATH AND CORONARY ANGIOGRAPHY N/A 05/01/2017   Procedure: Left Heart Cath and Coronary Angiography;  Surgeon: Martinique, Peter M, MD;  Location: [facility];  Service: Cardiovascular;  Laterality: N/A;  . LEFT HEART CATHETERIZATION WITH CORONARY ANGIOGRAM N/A 02/10/2014    Procedure: LEFT HEART CATHETERIZATION WITH CORONARY ANGIOGRAM;  Surgeon: Peter M Martinique, MD;  Location: [facility];  Service: Cardiovascular;  Laterality: N/A;  . LUMBAR LAMINECTOMY  1973   L4-S1  . PILONIDAL CYST EXCISION  1961  . SHOULDER ARTHROSCOPY     Bilateral  . TONSILLECTOMY AND ADENOIDECTOMY  CHILD  . TRANSURETHRAL RESECTION OF PROSTATE  1995     Medications: Current Outpatient Prescriptions  Medication Sig Dispense Refill  . aspirin 81 MG tablet Take 81 mg by mouth daily.     [name] atorvastatin (LIPITOR) 80 MG tablet Take 80 mg by mouth at bedtime.     . calcium carbonate (TUMS - DOSED IN MG ELEMENTAL CALCIUM) 500 MG chewable tablet Chew 1 tablet by mouth daily as needed for indigestion or heartburn.    . clopidogrel (PLAVIX) 75 MG tablet Take 75 mg by mouth daily.     [name] HYDROCORTISONE EX Apply 1 application topically daily as needed (ANKLE ITCHING).    . Lidocaine, Anorectal, (RECTICARE EX) Apply 1 application topically daily as needed (ANKLE ITCHING).    [name] losartan (COZAAR) 25 MG tablet Take 1 tablet (25 mg total) by mouth daily. 90 tablet 2  . metoprolol succinate (TOPROL-XL) 25 MG 24 hr tablet Take 12.5 mg by mouth daily.     . Multiple Vitamins-Minerals (MULTIVITAMIN PO) Take 1 tablet by mouth daily.    [name] Neomycin-Bacitracin-Polymyxin (TRIPLE ANTIBIOTIC EX) Apply 1 application topically at bedtime as needed (ANKLE).    . nitroGLYCERIN (NITROSTAT) 0.4 MG SL tablet Place 0.4 mg under the tongue every 5 (five) minutes as needed for chest pain (x 3 tablets daily).    . valACYclovir (VALTREX) 1000 MG tablet Take 1,000 mg by mouth daily as needed. Outbreak.    . vitamin C (ASCORBIC ACID) 500 MG tablet Take 500 mg by mouth daily.    [name] zolpidem (AMBIEN) 5 MG tablet Take 2.5 mg by mouth at bedtime as needed for sleep.     . furosemide (LASIX) 20 MG tablet Take 1 tablet (20 mg total) by mouth daily as needed for edema. 30 tablet 3   No current facility-administered medications for  this visit.     Allergies: Allergies  Allergen Reactions  . Flexeril [Cyclobenzaprine] Swelling  . Methocarbamol Swelling  . Orphenadrine Citrate Swelling  . Ramipril Other (See Comments)    DRY COUGH    Social History: The patient  reports that he has never smoked. He has never used smokeless tobacco. He reports that he drinks alcohol. He reports that he does not use drugs.   Family History: The patient's family history includes Hypertension in his mother; Lung cancer in his father.   Review of Systems: Please see the history of present illness.   Otherwise, the review of systems is positive for none.   All other systems are reviewed and negative.   Physical Exam: VS:  BP 120/70   Pulse 69  Ht 6\' 4"  (1.93 m)   Wt 243 lb (110.2 kg)   BMI 29.58 kg/m  .  BMI Body mass index is 29.58 kg/m.  Wt Readings from Last 3 Encounters:  05/21/17 243 lb (110.2 kg)  05/01/17 244 lb (110.7 kg)  04/23/17 243 lb (110.2 kg)    General:  He is alert and in no acute distress.   HEENT: Normal.  Neck: Supple, no JVD, carotid bruits, or masses noted.  Cardiac: Regular rate and rhythm. No murmurs, rubs, or gallops. No edema. There is no chest wall or breast tenderness to palpation. Radial cath site is without hematoma. Respiratory:  Lungs are clear to auscultation bilaterally with normal work of breathing.  GI: Soft and nontender.  Skin: Warm and dry. Color is normal.  Neuro:  Strength and sensation are intact and no gross focal deficits noted.  Psych: Alert, appropriate and with normal affect.    LABORATORY DATA:  EKG:  EKG is not ordered today.    Lab Results  Component Value Date   WBC 5.2 04/27/2017   HGB 15.2 04/27/2017   HCT 44.0 04/27/2017   PLT 196 04/27/2017   GLUCOSE 99 04/27/2017   CHOL 137 09/15/2011   TRIG 77.0 09/15/2011   HDL 47.30 09/15/2011   LDLCALC 74 09/15/2011   ALT 17 03/17/2011   AST 18 03/17/2011   NA 138 04/27/2017   K 4.7 04/27/2017   CL 104  04/27/2017   CREATININE 0.85 04/27/2017   BUN 15 04/27/2017   CO2 23 04/27/2017   INR 1.0 04/27/2017    BNP (last 3 results) No results for input(s): BNP in the last 8760 hours.  ProBNP (last 3 results) No results for input(s): PROBNP in the last 8760 hours.   Other Studies Reviewed Today:  Labs from primary care: 05/27/16 Cholesterol 101, triglycerides 64, HDL 40, LDL 48. Chemistries, TSH, Hgb are normal.  Myoview: 04/23/17: Study Highlights     The left ventricular ejection fraction is mildly decreased (45-54%).  Nuclear stress EF: 47%.  There was no ST segment deviation noted during stress.  Defect 1: There is a small defect of moderate severity present in the mid inferior and apical inferior location.  Findings consistent with ischemia.  This is an intermediate risk study.   There is small area mild severity reversible defect in the mid and apical inferior walls (SDS =2). LVEF is mildly decreased at 47%   Cardiac cath: 05/01/17: Conclusion     Prox RCA lesion, 20 %stenosed.  Dist RCA lesion, 50 %stenosed.  1st Mrg lesion, 0 %stenosed.  1st Diag lesion, 0 %stenosed.  Ost 1st Mrg to 1st Mrg lesion, 20 %stenosed.  Prox Cx to Mid Cx lesion, 20 %stenosed.  The left ventricular systolic function is normal.  LV end diastolic pressure is moderately elevated.  The left ventricular ejection fraction is 55-65% by visual estimate.   1. Nonobstructive CAD 2. Normal LV function 3. Elevated LVEDP  Plan: compared to last angiogram there is no significant change. Will continue medical therapy and consider other causes of chest pain.       Assessment/Plan: 1. CAD with first stent in the 1st OM back in 2003; DES to 2nd DX in March 2015. Negative Myoview from 08/2015.  Recent myoview suggested an area of inferior apical ischemia. S/p  cardiac cath showed nonobstructive disease with stable findings.   Continue current medical therapy. Symptoms seem better on  prilosec.   2. HTN - BP is well controlled  on his current therapy.   3. HLD - on high dose statin  4. Carotid disease with  stable doppler study in July 2017  5. Obesity/deconditioning - encourage increased aerobic activity. Congratulated on 13 lbs weight loss.  6. Swelling. Cardiac cath did show elevated LVEDP. Will prescribe lasix 20 mg prn swelling. Restrict salt.   Follow up 6 months.  Current medicines are reviewed with the patient today.  The patient does not have concerns regarding medicines other than what has been noted above.  The following changes have been made:  See above.  Labs/ tests ordered today include:    No orders of the defined types were placed in this encounter.   Signed: Peter Martinique MD, [facility]  05/21/2017 2:30 PM  [place]

## 2017-05-21 ENCOUNTER — Ambulatory Visit (INDEPENDENT_AMBULATORY_CARE_PROVIDER_SITE_OTHER): Payer: Medicare Other | Admitting: Cardiology

## 2017-05-21 ENCOUNTER — Encounter: Payer: Self-pay | Admitting: Cardiology

## 2017-05-21 VITALS — BP 120/70 | HR 69 | Ht 76.0 in | Wt 243.0 lb

## 2017-05-21 DIAGNOSIS — Z9861 Coronary angioplasty status: Secondary | ICD-10-CM | POA: Diagnosis not present

## 2017-05-21 DIAGNOSIS — I10 Essential (primary) hypertension: Secondary | ICD-10-CM | POA: Diagnosis not present

## 2017-05-21 DIAGNOSIS — I2510 Atherosclerotic heart disease of native coronary artery without angina pectoris: Secondary | ICD-10-CM

## 2017-05-21 DIAGNOSIS — E785 Hyperlipidemia, unspecified: Secondary | ICD-10-CM

## 2017-05-21 MED ORDER — FUROSEMIDE 20 MG PO TABS: 20.0000 mg | ORAL_TABLET | Freq: Every day | ORAL | 3 refills | Status: DC | PRN

## 2017-05-21 NOTE — Patient Instructions (Addendum)
Continue your current therapy- you may take lasix 20 mg daily prn swelling   I will see you in 6 months.

## 2017-05-27 ENCOUNTER — Ambulatory Visit: Payer: Medicare Other | Admitting: Cardiology

## 2017-06-09 DIAGNOSIS — R7301 Impaired fasting glucose: Secondary | ICD-10-CM | POA: Diagnosis not present

## 2017-06-09 DIAGNOSIS — E784 Other hyperlipidemia: Secondary | ICD-10-CM | POA: Diagnosis not present

## 2017-06-09 DIAGNOSIS — R8299 Other abnormal findings in urine: Secondary | ICD-10-CM | POA: Diagnosis not present

## 2017-06-09 DIAGNOSIS — I10 Essential (primary) hypertension: Secondary | ICD-10-CM | POA: Diagnosis not present

## 2017-06-09 DIAGNOSIS — Z125 Encounter for screening for malignant neoplasm of prostate: Secondary | ICD-10-CM | POA: Diagnosis not present

## 2017-06-11 DIAGNOSIS — H26491 Other secondary cataract, right eye: Secondary | ICD-10-CM | POA: Diagnosis not present

## 2017-06-14 ENCOUNTER — Other Ambulatory Visit: Payer: Self-pay | Admitting: Nurse Practitioner

## 2017-06-16 DIAGNOSIS — I10 Essential (primary) hypertension: Secondary | ICD-10-CM | POA: Diagnosis not present

## 2017-06-16 DIAGNOSIS — R7301 Impaired fasting glucose: Secondary | ICD-10-CM | POA: Diagnosis not present

## 2017-06-16 DIAGNOSIS — E668 Other obesity: Secondary | ICD-10-CM | POA: Diagnosis not present

## 2017-06-16 DIAGNOSIS — Z9861 Coronary angioplasty status: Secondary | ICD-10-CM | POA: Diagnosis not present

## 2017-06-16 DIAGNOSIS — I6529 Occlusion and stenosis of unspecified carotid artery: Secondary | ICD-10-CM | POA: Diagnosis not present

## 2017-06-16 DIAGNOSIS — E784 Other hyperlipidemia: Secondary | ICD-10-CM | POA: Diagnosis not present

## 2017-06-16 DIAGNOSIS — Z86010 Personal history of colonic polyps: Secondary | ICD-10-CM | POA: Diagnosis not present

## 2017-06-16 DIAGNOSIS — Z0000 Encounter for general adult medical examination without abnormal findings: Secondary | ICD-10-CM | POA: Diagnosis not present

## 2017-06-16 DIAGNOSIS — Z1389 Encounter for screening for other disorder: Secondary | ICD-10-CM | POA: Diagnosis not present

## 2017-06-16 DIAGNOSIS — Z6830 Body mass index (BMI) 30.0-30.9, adult: Secondary | ICD-10-CM | POA: Diagnosis not present

## 2017-06-16 DIAGNOSIS — K219 Gastro-esophageal reflux disease without esophagitis: Secondary | ICD-10-CM | POA: Diagnosis not present

## 2017-09-09 ENCOUNTER — Encounter (HOSPITAL_COMMUNITY): Payer: Self-pay | Admitting: *Deleted

## 2017-09-09 ENCOUNTER — Emergency Department (HOSPITAL_COMMUNITY)
Admission: EM | Admit: 2017-09-09 | Discharge: 2017-09-09 | Disposition: A | Discharge status: Home / Self Care | Payer: Medicare Other | Attending: Emergency Medicine | Admitting: Emergency Medicine

## 2017-09-09 ENCOUNTER — Emergency Department (HOSPITAL_COMMUNITY): Payer: Medicare Other

## 2017-09-09 DIAGNOSIS — Y929 Unspecified place or not applicable: Secondary | ICD-10-CM | POA: Diagnosis not present

## 2017-09-09 DIAGNOSIS — I10 Essential (primary) hypertension: Secondary | ICD-10-CM | POA: Insufficient documentation

## 2017-09-09 DIAGNOSIS — S0993XA Unspecified injury of face, initial encounter: Secondary | ICD-10-CM | POA: Diagnosis not present

## 2017-09-09 DIAGNOSIS — I2510 Atherosclerotic heart disease of native coronary artery without angina pectoris: Secondary | ICD-10-CM | POA: Diagnosis not present

## 2017-09-09 DIAGNOSIS — S0285XA Fracture of orbit, unspecified, initial encounter for closed fracture: Secondary | ICD-10-CM

## 2017-09-09 DIAGNOSIS — Z79899 Other long term (current) drug therapy: Secondary | ICD-10-CM | POA: Insufficient documentation

## 2017-09-09 DIAGNOSIS — Z955 Presence of coronary angioplasty implant and graft: Secondary | ICD-10-CM | POA: Insufficient documentation

## 2017-09-09 DIAGNOSIS — Z7902 Long term (current) use of antithrombotics/antiplatelets: Secondary | ICD-10-CM | POA: Diagnosis not present

## 2017-09-09 DIAGNOSIS — S0280XA Fracture of other specified skull and facial bones, unspecified side, initial encounter for closed fracture: Secondary | ICD-10-CM | POA: Diagnosis not present

## 2017-09-09 DIAGNOSIS — E119 Type 2 diabetes mellitus without complications: Secondary | ICD-10-CM | POA: Diagnosis not present

## 2017-09-09 DIAGNOSIS — W0110XA Fall on same level from slipping, tripping and stumbling with subsequent striking against unspecified object, initial encounter: Secondary | ICD-10-CM | POA: Diagnosis not present

## 2017-09-09 DIAGNOSIS — Y999 Unspecified external cause status: Secondary | ICD-10-CM | POA: Diagnosis not present

## 2017-09-09 DIAGNOSIS — Z7982 Long term (current) use of aspirin: Secondary | ICD-10-CM | POA: Diagnosis not present

## 2017-09-09 DIAGNOSIS — S0232XA Fracture of orbital floor, left side, initial encounter for closed fracture: Secondary | ICD-10-CM | POA: Diagnosis not present

## 2017-09-09 DIAGNOSIS — Y939 Activity, unspecified: Secondary | ICD-10-CM | POA: Diagnosis not present

## 2017-09-09 DIAGNOSIS — R51 Headache: Secondary | ICD-10-CM | POA: Diagnosis not present

## 2017-09-09 DIAGNOSIS — W19XXXA Unspecified fall, initial encounter: Secondary | ICD-10-CM

## 2017-09-09 DIAGNOSIS — S0990XA Unspecified injury of head, initial encounter: Secondary | ICD-10-CM | POA: Diagnosis not present

## 2017-09-09 MED ORDER — AMOXICILLIN-POT CLAVULANATE 875-125 MG PO TABS: 1.0000 | ORAL_TABLET | Freq: Two times a day (BID) | ORAL | 0 refills | Status: DC

## 2017-09-09 MED ORDER — HYDROCODONE-ACETAMINOPHEN 5-325 MG PO TABS
1.0000 | ORAL_TABLET | Freq: Once | ORAL | Status: AC
  Administered 2017-09-09: 1 via ORAL
  Filled 2017-09-09: qty 1

## 2017-09-09 MED ORDER — HYDROCODONE-ACETAMINOPHEN 5-325 MG PO TABS: 1.0000 | ORAL_TABLET | ORAL | 0 refills | Status: DC | PRN

## 2017-09-09 NOTE — ED Provider Notes (Signed)
[facility] DEPT Provider Note   CSN: [id number] Arrival date & time: 09/09/17  1330     History   Chief Complaint Chief Complaint  Patient presents with  . Fall    HPI Jared Clark is a 81 y.o. male.  HPI Patient presents after a fall. [place] earlier today also progressed since struck the left side of his face. No loss consciousness. He is however on aspirin and Plavix. Has some pain in his left eye area. No neck chest or shoulder pain. Mild abrasion to his knees. No neck pain. No numbness or weakness. No confusion. No vision change. No headaches but does have some pain around his left eye. Past Medical History:  Diagnosis Date  . Borderline diabetes    DIET CONTROLLED  . BPH (benign prostatic hypertrophy)    S/P TURP 1995  . Carotid artery disease ([place])    a. Carotid duplex in 05/2013: 75-64% RICA, <33% LICA. b. Per Patient, doppler 05/2015 showed 1-39% on the R - being folloewd by primary care.  . Cataract of right eye   . Coronary artery disease    a. 2003 s/p PCI/stenting; b. 02/2014 Abnl nuc -> Cath/PCI: LM nl, LAD nl, D2 95p (2.5x12 Promus DES), LCX <20, OM1 patent stent, RCA 50d, EF 55-65%. c. 08/2015: normal nuc.  [name] Dyslipidemia    History of dyslipidemia  . GERD (gastroesophageal reflux disease)   . Hypertension   . Mild depression ([place])   . Osteoarthritis    RIGHT ANKLE, BILATERAL WRIST, BACK  . Reflux OCCASIONAL   TAKES PRILOSEC  . Sinus bradycardia     Patient Active Problem List   Diagnosis Date Noted  . Chest pain with moderate risk for cardiac etiology 05/22/2016  . CAD S/P percutaneous coronary angioplasty 10/02/2015  . Essential hypertension 10/02/2015  . Sinus bradycardia 10/02/2015  . Bilateral carotid artery disease ([place]) 08/29/2015  . Abnormal nuclear stress test 02/07/2014  . Benign hypertensive heart disease without heart failure 08/22/2011  . Ischemic heart disease 03/19/2011  . Dyslipidemia 03/19/2011  . BPH (benign prostatic  hyperplasia) 03/19/2011  . Malaise and fatigue 03/19/2011    Past Surgical History:  Procedure Laterality Date  . CARDIAC CATHETERIZATION  02-06-09   NO INTERVENTION  . CARDIAC CATHETERIZATION N/A 05/22/2016   Procedure: Left Heart Cath and Coronary Angiography;  Surgeon: Peter M Martinique, MD;  Location: [facility];  Service: Cardiovascular;  Laterality: N/A;  . CAROTID STENT    . CERVICAL DISCECTOMY  1989   C4 - 5  . CHOLECYSTECTOMY  2951   W/ UMBILICAL HERNIA REPAIR  . CORONARY ANGIOPLASTY  2003- FIRST OBTUSE MARGIANL VESSEL    X1 CYPHER STENT  . CORONARY STENT PLACEMENT     DES to first diagonal        DR Martinique  . HERNIA REPAIR  [id number]   Umbilical  . INGUINAL HERNIA REPAIR  1985  . KNEE ARTHROSCOPY  11/05/2011   Procedure: ARTHROSCOPY KNEE;  Surgeon: Gearlean Alf;  Location: [facility];  Service: Orthopedics;  Laterality: Right;  RIGHT ARTHROSCOPY KNEE WITH DEBRIDEMENT, Chondroplasty  . LEFT HEART CATH AND CORONARY ANGIOGRAPHY N/A 05/01/2017   Procedure: Left Heart Cath and Coronary Angiography;  Surgeon: Martinique, Peter M, MD;  Location: [facility];  Service: Cardiovascular;  Laterality: N/A;  . LEFT HEART CATHETERIZATION WITH CORONARY ANGIOGRAM N/A 02/10/2014   Procedure: LEFT HEART CATHETERIZATION WITH CORONARY ANGIOGRAM;  Surgeon: Peter M Martinique, MD;  Location: [facility]  CATH LAB;  Service: Cardiovascular;  Laterality: N/A;  . LUMBAR LAMINECTOMY  1973   L4-S1  . PILONIDAL CYST EXCISION  1961  . SHOULDER ARTHROSCOPY     Bilateral  . TONSILLECTOMY AND ADENOIDECTOMY  CHILD  . TRANSURETHRAL RESECTION OF PROSTATE  1995       Home Medications    Prior to Admission medications   Medication Sig Start Date End Date Taking? Authorizing Provider  aspirin 81 MG tablet Take 81 mg by mouth daily.    Yes [provider]  atorvastatin (LIPITOR) 80 MG tablet Take 80 mg by mouth at bedtime.    Yes [provider]  calcium carbonate (TUMS - DOSED  IN MG ELEMENTAL CALCIUM) 500 MG chewable tablet Chew 1 tablet by mouth daily as needed for indigestion or heartburn.   Yes [provider]  citalopram (CELEXA) 20 MG tablet Take 20 mg by mouth daily.   Yes [provider]  clopidogrel (PLAVIX) 75 MG tablet Take 75 mg by mouth daily.    Yes [provider]  furosemide (LASIX) 20 MG tablet Take 1 tablet (20 mg total) by mouth daily as needed for edema. 05/21/17  Yes Martinique, Peter M, MD  HYDROCORTISONE EX Apply 1 application topically daily as needed (ANKLE ITCHING).   Yes [provider]  Lidocaine, Anorectal, (RECTICARE EX) Apply 1 application topically daily as needed (ANKLE ITCHING).   Yes [provider]  losartan (COZAAR) 25 MG tablet TAKE 1 TABLET BY MOUTH  DAILY 06/15/17  Yes Burtis Junes, NP  metoprolol succinate (TOPROL-XL) 25 MG 24 hr tablet Take 12.5 mg by mouth daily.  09/09/12  Yes Darlin Coco, MD  naproxen sodium (ANAPROX) 220 MG tablet Take 220 mg by mouth daily.   Yes [provider]  valACYclovir (VALTREX) 1000 MG tablet Take 1,000 mg by mouth daily as needed. Outbreak.   Yes [provider]  zolpidem (AMBIEN) 5 MG tablet Take 2.5 mg by mouth at bedtime as needed for sleep.  08/16/15  Yes [provider]  amoxicillin-clavulanate (AUGMENTIN) 875-125 MG tablet Take 1 tablet by mouth every 12 (twelve) hours. 09/09/17   Davonna Belling, MD  HYDROcodone-acetaminophen (NORCO/VICODIN) 5-325 MG tablet Take 1-2 tablets by mouth every 4 (four) hours as needed. 09/09/17   Davonna Belling, MD  nitroGLYCERIN (NITROSTAT) 0.4 MG SL tablet Place 0.4 mg under the tongue every 5 (five) minutes as needed for chest pain (x 3 tablets daily).    [provider]    Family History Family History  Problem Relation Age of Onset  . Hypertension Mother   . Lung cancer Father   . Heart attack Neg Hx   . Stroke Neg Hx     Social History Social History  Substance Use  Topics  . Smoking status: Never Smoker  . Smokeless tobacco: Never Used  . Alcohol use Yes     Comment: RARE     Allergies   Flexeril [cyclobenzaprine]; Methocarbamol; Orphenadrine citrate; and Ramipril   Review of Systems Review of Systems   Physical Exam Updated Vital Signs BP (!) 165/81 (BP Location: Right Arm)   Pulse (!) 55   Temp (!) 97.5 F (36.4 C) (Oral)   Resp 17   Ht 6\' 4"  (1.93 m)   Wt 109.8 kg (242 lb)   SpO2 94%   BMI 29.46 kg/m   Physical Exam  Constitutional: He appears well-developed and well-nourished.  HENT:  Abrasion/hematoma of left arm. Below left eye with some  subcutaneous emphysema. Patient has self packed his left nostril for bleeding. Packing removed from nostril without active bleeding seen.  Eyes: Pupils are equal, round, and reactive to light. EOM are normal.  Neck: Neck supple.  Cardiovascular: Normal rate.   Pulmonary/Chest: Effort normal.  Abdominal: Soft. There is no tenderness.  Musculoskeletal:  Mild abrasions to bilateral knees without underlying bony tenderness  Neurological: He is alert.  Skin: Skin is warm. Capillary refill takes less than 2 seconds.     ED Treatments / Results  Labs (all labs ordered are listed, but only abnormal results are displayed) Labs Reviewed - No data to display  EKG  EKG Interpretation None       Radiology Ct Head Wo Contrast  Result Date: 09/09/2017 CLINICAL DATA:  Left-sided facial and orbital pain.  Fall. EXAM: CT HEAD WITHOUT CONTRAST CT MAXILLOFACIAL WITHOUT CONTRAST TECHNIQUE: Multidetector CT imaging of the head and maxillofacial structures were performed using the standard protocol without intravenous contrast. Multiplanar CT image reconstructions of the maxillofacial structures were also generated. COMPARISON:  11/14/2014 FINDINGS: CT HEAD FINDINGS Brain: There is no mass effect, midline shift, or acute intracranial hemorrhage. Several lipomata in the anterior falx. Mild global  atrophy is appropriate to age. Minimal chronic ischemic changes in the periventricular white matter. Vascular: No hyperdense vessel or unexpected calcification. Skull: Cranium is intact.  Fracture of the left orbit is noted. Other: Noncontributory. CT MAXILLOFACIAL FINDINGS Osseous: There is a fracture involving the the floor of the orbit. There is inferior displacement of fracture fragments. There is some orbital fat herniated through the defect in the floor. There is no evidence of entrapment of the ocular muscles. There is hemorrhage layering in the left maxillary sinus. The orbital rim is intact. There is also a a minimally displaced fracture involving the medial left orbit wall. There is a large amount of emphysema within the left orbit. There is no evidence of intraorbital hemorrhage. There is no evidence of vitreous hemorrhage. Nasal septum is deviated to the right. Orbits: There is a large amount of emphysema within the left or bed consistent with an orbital blowout fracture. Sinuses: Other then hemorrhage in the left maxillary sinus, paranasal sinuses are clear. Mastoid air cells are clear. Soft tissues: Negative. IMPRESSION: There are minimally displaced fractures of the left orbit floor and left medial orbit wall as described. This is associated with a large amount of emphysema within the soft tissues of the orbit. There is no evidence of hemorrhage within the left orbit No acute intracranial pathology.  Chronic changes are noted. Electronically Signed   By: Marybelle Killings [name]   On: 09/09/2017 15:27   Ct Maxillofacial Wo Contrast  Result Date: 09/09/2017 CLINICAL DATA:  Left-sided facial and orbital pain.  Fall. EXAM: CT HEAD WITHOUT CONTRAST CT MAXILLOFACIAL WITHOUT CONTRAST TECHNIQUE: Multidetector CT imaging of the head and maxillofacial structures were performed using the standard protocol without intravenous contrast. Multiplanar CT image reconstructions of the maxillofacial structures were also  generated. COMPARISON:  11/14/2014 FINDINGS: CT HEAD FINDINGS Brain: There is no mass effect, midline shift, or acute intracranial hemorrhage. Several lipomata in the anterior falx. Mild global atrophy is appropriate to age. Minimal chronic ischemic changes in the periventricular white matter. Vascular: No hyperdense vessel or unexpected calcification. Skull: Cranium is intact.  Fracture of the left orbit is noted. Other: Noncontributory. CT MAXILLOFACIAL FINDINGS Osseous: There is a fracture involving the the floor of the orbit. There is inferior displacement of fracture fragments. There is some  orbital fat herniated through the defect in the floor. There is no evidence of entrapment of the ocular muscles. There is hemorrhage layering in the left maxillary sinus. The orbital rim is intact. There is also a a minimally displaced fracture involving the medial left orbit wall. There is a large amount of emphysema within the left orbit. There is no evidence of intraorbital hemorrhage. There is no evidence of vitreous hemorrhage. Nasal septum is deviated to the right. Orbits: There is a large amount of emphysema within the left or bed consistent with an orbital blowout fracture. Sinuses: Other then hemorrhage in the left maxillary sinus, paranasal sinuses are clear. Mastoid air cells are clear. Soft tissues: Negative. IMPRESSION: There are minimally displaced fractures of the left orbit floor and left medial orbit wall as described. This is associated with a large amount of emphysema within the soft tissues of the orbit. There is no evidence of hemorrhage within the left orbit No acute intracranial pathology.  Chronic changes are noted. Electronically Signed   By: Marybelle Killings [name]   On: 09/09/2017 15:27    Procedures Procedures (including critical care time)  Medications Ordered in ED Medications  HYDROcodone-acetaminophen (NORCO/VICODIN) 5-325 MG per tablet 1 tablet (not administered)     Initial Impression  / Assessment and Plan / ED Course  I have reviewed the triage vital signs and the nursing notes.  Pertinent labs & imaging results that were available during my care of the patient were reviewed by me and considered in my medical decision making (see chart for details).     Patient was fall. Orbital floor and wall fracture. Some subcutaneous emphysema around orbit. No entrapment. No active bleeding from nose. Will discharge home to follow-up with ENT and ophthalmology. Prophylactic antibiotics will be given.  Final Clinical Impressions(s) / ED Diagnoses   Final diagnoses:  Fall, initial encounter  Closed fracture of orbit, initial encounter ([place])    New Prescriptions New Prescriptions   AMOXICILLIN-CLAVULANATE (AUGMENTIN) 875-125 MG TABLET    Take 1 tablet by mouth every 12 (twelve) hours.   HYDROCODONE-ACETAMINOPHEN (NORCO/VICODIN) 5-325 MG TABLET    Take 1-2 tablets by mouth every 4 (four) hours as needed.     Davonna Belling, MD 09/09/17 1630

## 2017-09-09 NOTE — ED Triage Notes (Signed)
Patient is alert and oriented x4.   He is from home and being seen for a fall.  Patient states that he was bent over on slippery grass and landed on his forehead.  Patient adds that he is on Plavix and a baby aspirin.  Currently the patient has swelling to the left eye area with intermittent nose bleeding.

## 2017-09-10 DIAGNOSIS — Z23 Encounter for immunization: Secondary | ICD-10-CM | POA: Diagnosis not present

## 2017-09-11 DIAGNOSIS — S0232XA Fracture of orbital floor, left side, initial encounter for closed fracture: Secondary | ICD-10-CM | POA: Diagnosis not present

## 2017-09-16 DIAGNOSIS — R0781 Pleurodynia: Secondary | ICD-10-CM | POA: Diagnosis not present

## 2017-09-23 DIAGNOSIS — M545 Low back pain: Secondary | ICD-10-CM | POA: Diagnosis not present

## 2017-09-24 ENCOUNTER — Other Ambulatory Visit: Payer: Self-pay | Admitting: Internal Medicine

## 2017-09-24 DIAGNOSIS — M545 Low back pain: Secondary | ICD-10-CM

## 2017-10-14 ENCOUNTER — Ambulatory Visit
Admission: RE | Admit: 2017-10-14 | Discharge: 2017-10-14 | Disposition: A | Discharge status: Home / Self Care | Payer: Medicare Other | Source: Ambulatory Visit | Attending: Internal Medicine | Admitting: Internal Medicine

## 2017-10-14 DIAGNOSIS — M48061 Spinal stenosis, lumbar region without neurogenic claudication: Secondary | ICD-10-CM | POA: Diagnosis not present

## 2017-10-14 DIAGNOSIS — M545 Low back pain: Secondary | ICD-10-CM

## 2017-10-23 DIAGNOSIS — R51 Headache: Secondary | ICD-10-CM | POA: Diagnosis not present

## 2017-10-23 DIAGNOSIS — Z6829 Body mass index (BMI) 29.0-29.9, adult: Secondary | ICD-10-CM | POA: Diagnosis not present

## 2017-10-23 DIAGNOSIS — M545 Low back pain: Secondary | ICD-10-CM | POA: Diagnosis not present

## 2017-10-28 ENCOUNTER — Other Ambulatory Visit: Payer: Self-pay | Admitting: Internal Medicine

## 2017-10-28 ENCOUNTER — Ambulatory Visit
Admission: RE | Admit: 2017-10-28 | Discharge: 2017-10-28 | Disposition: A | Discharge status: Home / Self Care | Payer: Medicare Other | Source: Ambulatory Visit | Attending: Internal Medicine | Admitting: Internal Medicine

## 2017-10-28 DIAGNOSIS — R51 Headache: Secondary | ICD-10-CM | POA: Diagnosis not present

## 2017-10-28 DIAGNOSIS — R519 Headache, unspecified: Secondary | ICD-10-CM

## 2017-10-28 DIAGNOSIS — S0990XA Unspecified injury of head, initial encounter: Secondary | ICD-10-CM | POA: Diagnosis not present

## 2017-11-10 ENCOUNTER — Encounter: Payer: Self-pay | Admitting: Physician Assistant

## 2017-11-10 ENCOUNTER — Ambulatory Visit (INDEPENDENT_AMBULATORY_CARE_PROVIDER_SITE_OTHER): Payer: Medicare Other | Admitting: Physician Assistant

## 2017-11-10 VITALS — BP 150/76 | HR 55 | Ht 72.0 in | Wt 248.0 lb

## 2017-11-10 DIAGNOSIS — I10 Essential (primary) hypertension: Secondary | ICD-10-CM

## 2017-11-10 DIAGNOSIS — I779 Disorder of arteries and arterioles, unspecified: Secondary | ICD-10-CM | POA: Diagnosis not present

## 2017-11-10 DIAGNOSIS — I2510 Atherosclerotic heart disease of native coronary artery without angina pectoris: Secondary | ICD-10-CM

## 2017-11-10 DIAGNOSIS — E785 Hyperlipidemia, unspecified: Secondary | ICD-10-CM

## 2017-11-10 DIAGNOSIS — I739 Peripheral vascular disease, unspecified: Secondary | ICD-10-CM

## 2017-11-10 DIAGNOSIS — Z9861 Coronary angioplasty status: Secondary | ICD-10-CM

## 2017-11-10 DIAGNOSIS — M47816 Spondylosis without myelopathy or radiculopathy, lumbar region: Secondary | ICD-10-CM | POA: Diagnosis not present

## 2017-11-10 NOTE — Progress Notes (Signed)
Cardiology Office Note    Date:  11/12/2017   ID:  [name], DOB [birth year], MRN [id number]  PCP:  Marton Redwood, MD  Cardiologist:  Dr. Martinique   Chief Complaint  Patient presents with  . Follow-up    seen for Dr. Martinique    History of Present Illness:  Jared Clark is a 81 y.o. male with PMH of diet controlled borderline DM, carotid artery disease, HLD, HTN, GERD and CAD.  He underwent stenting of OM1 in September 2003.  Repeat cardiac catheterization in March 2010 after abnormal Myoview showed a long term patency of prior stent and nonobstructive CAD elsewhere.  He had a recurrent chest discomfort in 2015, Myoview showed reversible anterolateral ischemia.  He underwent cardiac catheterization on 02/10/2014 with successful stenting of D1.  Last Myoview obtained on 04/23/2017 showed EF 47%, small defect of moderate severity present in the mid inferior, apical inferior location consistent with ischemia.  Subsequent cardiac catheterization on 05/01/2017 showed nonobstructive disease.  He did suffer a fall in October 2018, CT of head was negative for acute process.  He presents today for cardiology office visit.  He denies any significant chest discomfort or shortness of breath.  He does not have significant lower extremity edema, orthopnea or PND.  He can follow-up in 1 year.  He is a retired Engineer, drilling.  Previous fall was related to him tripping over on a slippery floor.  It appears to be mechanical in nature.  He has been very careful not to fall again.   Past Medical History:  Diagnosis Date  . Borderline diabetes    DIET CONTROLLED  . BPH (benign prostatic hypertrophy)    S/P TURP 1995  . Carotid artery disease ([place])    a. Carotid duplex in 05/2013: 25-95% RICA, <63% LICA. b. Per Patient, doppler 05/2015 showed 1-39% on the R - being folloewd by primary care.  . Cataract of right eye   . Coronary artery disease    a. 2003 s/p PCI/stenting; b. 02/2014 Abnl nuc -> Cath/PCI: LM nl,  LAD nl, D2 95p (2.5x12 Promus DES), LCX <20, OM1 patent stent, RCA 50d, EF 55-65%. c. 08/2015: normal nuc.  [name] Dyslipidemia    History of dyslipidemia  . GERD (gastroesophageal reflux disease)   . Hypertension   . Mild depression ([place])   . Osteoarthritis    RIGHT ANKLE, BILATERAL WRIST, BACK  . Reflux OCCASIONAL   TAKES PRILOSEC  . Sinus bradycardia     Past Surgical History:  Procedure Laterality Date  . CARDIAC CATHETERIZATION  02-06-09   NO INTERVENTION  . CARDIAC CATHETERIZATION N/A 05/22/2016   Procedure: Left Heart Cath and Coronary Angiography;  Surgeon: Peter M Martinique, MD;  Location: [facility];  Service: Cardiovascular;  Laterality: N/A;  . CAROTID STENT    . CERVICAL DISCECTOMY  1989   C4 - 5  . CHOLECYSTECTOMY  8756   W/ UMBILICAL HERNIA REPAIR  . CORONARY ANGIOPLASTY  2003- FIRST OBTUSE MARGIANL VESSEL    X1 CYPHER STENT  . CORONARY STENT PLACEMENT     DES to first diagonal        DR Martinique  . HERNIA REPAIR  4332   Umbilical  . INGUINAL HERNIA REPAIR  1985  . KNEE ARTHROSCOPY  11/05/2011   Procedure: ARTHROSCOPY KNEE;  Surgeon: Gearlean Alf;  Location: [facility];  Service: Orthopedics;  Laterality: Right;  RIGHT ARTHROSCOPY KNEE WITH DEBRIDEMENT, Chondroplasty  . LEFT HEART CATH  AND CORONARY ANGIOGRAPHY N/A 05/01/2017   Procedure: Left Heart Cath and Coronary Angiography;  Surgeon: Martinique, Peter M, MD;  Location: [facility];  Service: Cardiovascular;  Laterality: N/A;  . LEFT HEART CATHETERIZATION WITH CORONARY ANGIOGRAM N/A 02/10/2014   Procedure: LEFT HEART CATHETERIZATION WITH CORONARY ANGIOGRAM;  Surgeon: Peter M Martinique, MD;  Location: [facility] C[facility] LAB;  Service: Cardiovascular;  Laterality: N/A;  . LUMBAR LAMINECTOMY  1973   L4-S1  . PILONIDAL CYST EXCISION  1961  . SHOULDER ARTHROSCOPY     Bilateral  . TONSILLECTOMY AND ADENOIDECTOMY  CHILD  . TRANSURETHRAL RESECTION OF PROSTATE  1995    Current Medications: Outpatient  Medications Prior to Visit  Medication Sig Dispense Refill  . amoxicillin-clavulanate (AUGMENTIN) 875-125 MG tablet Take 1 tablet by mouth every 12 (twelve) hours. 14 tablet 0  . aspirin 81 MG tablet Take 81 mg by mouth daily.     [name] atorvastatin (LIPITOR) 80 MG tablet Take 80 mg by mouth at bedtime.     . calcium carbonate (TUMS - DOSED IN MG ELEMENTAL CALCIUM) 500 MG chewable tablet Chew 1 tablet by mouth daily as needed for indigestion or heartburn.    . citalopram (CELEXA) 20 MG tablet Take 20 mg by mouth daily.    . clopidogrel (PLAVIX) 75 MG tablet Take 75 mg by mouth daily.     . furosemide (LASIX) 20 MG tablet Take 1 tablet (20 mg total) by mouth daily as needed for edema. 30 tablet 3  . HYDROcodone-acetaminophen (NORCO/VICODIN) 5-325 MG tablet Take 1-2 tablets by mouth every 4 (four) hours as needed. 8 tablet 0  . HYDROCORTISONE EX Apply 1 application topically daily as needed (ANKLE ITCHING).    . Lidocaine, Anorectal, (RECTICARE EX) Apply 1 application topically daily as needed (ANKLE ITCHING).    [name] losartan (COZAAR) 25 MG tablet TAKE 1 TABLET BY MOUTH  DAILY 90 tablet 3  . metoprolol succinate (TOPROL-XL) 25 MG 24 hr tablet Take 12.5 mg by mouth daily.     . naproxen sodium (ANAPROX) 220 MG tablet Take 220 mg by mouth daily.    . nitroGLYCERIN (NITROSTAT) 0.4 MG SL tablet Place 0.4 mg under the tongue every 5 (five) minutes as needed for chest pain (x 3 tablets daily).    . valACYclovir (VALTREX) 1000 MG tablet Take 1,000 mg by mouth daily as needed. Outbreak.    . zolpidem (AMBIEN) 5 MG tablet Take 2.5 mg by mouth at bedtime as needed for sleep.      No facility-administered medications prior to visit.      Allergies:   Flexeril [cyclobenzaprine]; Methocarbamol; Orphenadrine citrate; and Ramipril   Social History   Socioeconomic History  . Marital status: Married    Spouse name: None  . Number of children: None  . Years of education: None  . Highest education level: None    Social Needs  . Financial resource strain: None  . Food insecurity - worry: None  . Food insecurity - inability: None  . Transportation needs - medical: None  . Transportation needs - non-medical: None  Occupational History  . None  Tobacco Use  . Smoking status: Never Smoker  . Smokeless tobacco: Never Used  Substance and Sexual Activity  . Alcohol use: Yes    Comment: RARE  . Drug use: No  . Sexual activity: None  Other Topics Concern  . None  Social History Narrative  . None     Family History:  The patient's family  history includes Hypertension in his mother; Lung cancer in his father.   ROS:   Please see the history of present illness.    ROS All other systems reviewed and are negative.   PHYSICAL EXAM:   VS:  BP (!) 150/76   Pulse (!) 55   Ht 6' (1.829 m)   Wt 248 lb (112.5 kg)   BMI 33.63 kg/m    GEN: Well nourished, well developed, in no acute distress  HEENT: normal  Neck: no JVD, carotid bruits, or masses Cardiac: RRR; no murmurs, rubs, or gallops,no edema  Respiratory:  clear to auscultation bilaterally, normal work of breathing GI: soft, nontender, nondistended, + BS MS: no deformity or atrophy  Skin: warm and dry, no rash Neuro:  Alert and Oriented x 3, Strength and sensation are intact Psych: euthymic mood, full affect  Wt Readings from Last 3 Encounters:  11/10/17 248 lb (112.5 kg)  09/09/17 242 lb (109.8 kg)  05/21/17 243 lb (110.2 kg)      Studies/Labs Reviewed:   EKG:  EKG is not ordered today.    Recent Labs: 04/27/2017: BUN 15; Creatinine, Ser 0.85; Hemoglobin 15.2; Platelets 196; Potassium 4.7; Sodium 138   Lipid Panel    Component Value Date/Time   CHOL 137 09/15/2011 0950   TRIG 77.0 09/15/2011 0950   HDL 47.30 09/15/2011 0950   CHOLHDL 3 09/15/2011 0950   VLDL 15.4 09/15/2011 0950   LDLCALC 74 09/15/2011 0950    Additional studies/ records that were reviewed today include:    Carotid US 05/24/2014    Carotid US  06/12/2016     Myoview 04/23/2017 Study Highlights     The left ventricular ejection fraction is mildly decreased (45-54%).  Nuclear stress EF: 47%.  There was no ST segment deviation noted during stress.  Defect 1: There is a small defect of moderate severity present in the mid inferior and apical inferior location.  Findings consistent with ischemia.  This is an intermediate risk study.   There is small area mild severity reversible defect in the mid and apical inferior walls (SDS =2). LVEF is mildly decreased at 47%.      Cath 05/01/2017 Conclusion     Prox RCA lesion, 20 %stenosed.  Dist RCA lesion, 50 %stenosed.  1st Mrg lesion, 0 %stenosed.  1st Diag lesion, 0 %stenosed.  Ost 1st Mrg to 1st Mrg lesion, 20 %stenosed.  Prox Cx to Mid Cx lesion, 20 %stenosed.  The left ventricular systolic function is normal.  LV end diastolic pressure is moderately elevated.  The left ventricular ejection fraction is 55-65% by visual estimate.   1. Nonobstructive CAD 2. Normal LV function 3. Elevated LVEDP  Plan: compared to last angiogram there is no significant change. Will continue medical therapy and consider other causes of chest pain      ASSESSMENT:    1. Coronary artery disease involving native coronary artery of native heart without angina pectoris   2. Essential hypertension   3. Hyperlipidemia, unspecified hyperlipidemia type   4. Bilateral carotid artery disease, unspecified type ([place])      PLAN:  In order of problems listed above:  1. CAD: Denies any chest pain or shortness of breath.  Recent cardiac catheterization in May 2018 showed mild nonobstructive disease.  2. Carotid artery disease: Although the previous carotid ultrasound seems to indicate moderate disease on the right, however more recent carotid ultrasound in 2017 showed only 1-39% disease bilaterally.  May consider repeat another carotid ultrasound  in 2019.  3. Hypertension: Blood  pressure well controlled, continue losartan and metoprolol  4. Hyperlipidemia: Continue 80 mg daily of Lipitor.  Fasting lipid panel and LFT monitored by primary care provider.   Medication Adjustments/Labs and Tests Ordered: Current medicines are reviewed at length with the patient today.  Concerns regarding medicines are outlined above.  Medication changes, Labs and Tests ordered today are listed in the Patient Instructions below. Patient Instructions  Medication Instructions:   No changes to current regimen.  Labwork:   none  Testing/Procedures:  none  Follow-Up:  With Dr. Martinique in 1 year, or sooner if needed.  If you need a refill on your cardiac medications before your next appointment, please call your pharmacy.      Hilbert Corrigan, [place]  11/12/2017 1:03 PM    [facility] [address], [place], [place]  [address] Phone: [phone]; Fax: [phone]

## 2017-11-10 NOTE — Patient Instructions (Signed)
Medication Instructions:   No changes to current regimen.  Labwork:   none  Testing/Procedures:  none  Follow-Up:  With Dr. Martinique in 1 year, or sooner if needed.  If you need a refill on your cardiac medications before your next appointment, please call your pharmacy.

## 2017-11-12 ENCOUNTER — Encounter: Payer: Self-pay | Admitting: Physician Assistant

## 2017-11-20 DIAGNOSIS — M47816 Spondylosis without myelopathy or radiculopathy, lumbar region: Secondary | ICD-10-CM | POA: Diagnosis not present

## 2017-12-01 ENCOUNTER — Other Ambulatory Visit: Payer: Self-pay

## 2017-12-01 ENCOUNTER — Emergency Department (HOSPITAL_COMMUNITY): Payer: Medicare Other

## 2017-12-01 ENCOUNTER — Observation Stay (HOSPITAL_COMMUNITY)
Admission: EM | Admit: 2017-12-01 | Discharge: 2017-12-02 | Disposition: A | Discharge status: Home / Self Care | Payer: Medicare Other | Attending: Internal Medicine | Admitting: Internal Medicine

## 2017-12-01 ENCOUNTER — Encounter (HOSPITAL_COMMUNITY): Payer: Self-pay

## 2017-12-01 DIAGNOSIS — Z79899 Other long term (current) drug therapy: Secondary | ICD-10-CM | POA: Diagnosis not present

## 2017-12-01 DIAGNOSIS — S199XXA Unspecified injury of neck, initial encounter: Secondary | ICD-10-CM | POA: Diagnosis not present

## 2017-12-01 DIAGNOSIS — Z9861 Coronary angioplasty status: Secondary | ICD-10-CM | POA: Diagnosis not present

## 2017-12-01 DIAGNOSIS — Z7982 Long term (current) use of aspirin: Secondary | ICD-10-CM | POA: Diagnosis not present

## 2017-12-01 DIAGNOSIS — R4182 Altered mental status, unspecified: Secondary | ICD-10-CM | POA: Diagnosis not present

## 2017-12-01 DIAGNOSIS — R55 Syncope and collapse: Secondary | ICD-10-CM

## 2017-12-01 DIAGNOSIS — I119 Hypertensive heart disease without heart failure: Secondary | ICD-10-CM | POA: Diagnosis not present

## 2017-12-01 DIAGNOSIS — I2510 Atherosclerotic heart disease of native coronary artery without angina pectoris: Secondary | ICD-10-CM

## 2017-12-01 DIAGNOSIS — L509 Urticaria, unspecified: Secondary | ICD-10-CM | POA: Diagnosis not present

## 2017-12-01 DIAGNOSIS — Z955 Presence of coronary angioplasty implant and graft: Secondary | ICD-10-CM | POA: Diagnosis not present

## 2017-12-01 DIAGNOSIS — E785 Hyperlipidemia, unspecified: Secondary | ICD-10-CM | POA: Diagnosis present

## 2017-12-01 DIAGNOSIS — I959 Hypotension, unspecified: Secondary | ICD-10-CM

## 2017-12-01 DIAGNOSIS — N400 Enlarged prostate without lower urinary tract symptoms: Secondary | ICD-10-CM | POA: Insufficient documentation

## 2017-12-01 DIAGNOSIS — E119 Type 2 diabetes mellitus without complications: Secondary | ICD-10-CM | POA: Diagnosis not present

## 2017-12-01 DIAGNOSIS — R2681 Unsteadiness on feet: Secondary | ICD-10-CM | POA: Insufficient documentation

## 2017-12-01 DIAGNOSIS — F329 Major depressive disorder, single episode, unspecified: Secondary | ICD-10-CM | POA: Diagnosis not present

## 2017-12-01 DIAGNOSIS — K219 Gastro-esophageal reflux disease without esophagitis: Secondary | ICD-10-CM | POA: Diagnosis not present

## 2017-12-01 DIAGNOSIS — I259 Chronic ischemic heart disease, unspecified: Secondary | ICD-10-CM | POA: Diagnosis present

## 2017-12-01 HISTORY — DX: Syncope and collapse: R55

## 2017-12-01 HISTORY — DX: Urticaria, unspecified: L50.9

## 2017-12-01 LAB — URINALYSIS, ROUTINE W REFLEX MICROSCOPIC
Bacteria, UA: NONE SEEN
Bilirubin Urine: NEGATIVE
Glucose, UA: NEGATIVE mg/dL
Hgb urine dipstick: NEGATIVE
Ketones, ur: NEGATIVE mg/dL
Leukocytes, UA: NEGATIVE
Nitrite: NEGATIVE
Protein, ur: 30 mg/dL — AB
Specific Gravity, Urine: 1.025 (ref 1.005–1.030)
pH: 5 (ref 5.0–8.0)

## 2017-12-01 LAB — CBC WITH DIFFERENTIAL/PLATELET
BASOS ABS: 0 10*3/uL (ref 0.0–0.1)
Basophils Relative: 0 %
EOS ABS: 0 10*3/uL (ref 0.0–0.7)
EOS PCT: 0 %
HCT: 48.3 % (ref 39.0–52.0)
HEMOGLOBIN: 16.7 g/dL (ref 13.0–17.0)
LYMPHS ABS: 1.6 10*3/uL (ref 0.7–4.0)
LYMPHS PCT: 13 %
MCH: 31.5 pg (ref 26.0–34.0)
MCHC: 34.6 g/dL (ref 30.0–36.0)
MCV: 91.1 fL (ref 78.0–100.0)
Monocytes Absolute: 0.5 10*3/uL (ref 0.1–1.0)
Monocytes Relative: 4 %
NEUTROS PCT: 83 %
Neutro Abs: 10.5 10*3/uL — ABNORMAL HIGH (ref 1.7–7.7)
PLATELETS: 221 10*3/uL (ref 150–400)
RBC: 5.3 MIL/uL (ref 4.22–5.81)
RDW: 14 % (ref 11.5–15.5)
WBC: 12.6 10*3/uL — AB (ref 4.0–10.5)

## 2017-12-01 LAB — COMPREHENSIVE METABOLIC PANEL
ALK PHOS: 67 U/L (ref 38–126)
ALT: 22 U/L (ref 17–63)
AST: 31 U/L (ref 15–41)
Albumin: 3.7 g/dL (ref 3.5–5.0)
Anion gap: 11 (ref 5–15)
BUN: 17 mg/dL (ref 6–20)
CALCIUM: 9.5 mg/dL (ref 8.9–10.3)
CHLORIDE: 106 mmol/L (ref 101–111)
CO2: 19 mmol/L — AB (ref 22–32)
CREATININE: 0.99 mg/dL (ref 0.61–1.24)
GFR calc Af Amer: >60 mL/min (ref >60)
GFR calc non Af Amer: >60 mL/min (ref >60)
Glucose, Bld: 128 mg/dL — ABNORMAL HIGH (ref 65–99)
Potassium: 4.1 mmol/L (ref 3.5–5.1)
SODIUM: 136 mmol/L (ref 135–145)
Total Bilirubin: 0.9 mg/dL (ref 0.3–1.2)
Total Protein: 6.9 g/dL (ref 6.5–8.1)

## 2017-12-01 LAB — TROPONIN I: Troponin I: <0.03 ng/mL (ref <0.03)

## 2017-12-01 MED ORDER — SODIUM CHLORIDE 0.9 % IV BOLUS (SEPSIS)
500.0000 mL | Freq: Once | INTRAVENOUS | Status: AC
  Administered 2017-12-01: 500 mL via INTRAVENOUS

## 2017-12-01 MED ORDER — SODIUM CHLORIDE 0.9 % IV SOLN
INTRAVENOUS | Status: AC
  Administered 2017-12-01: 19:00:00 via INTRAVENOUS

## 2017-12-01 MED ORDER — METHYLPREDNISOLONE SODIUM SUCC 125 MG IJ SOLR
125.0000 mg | Freq: Once | INTRAMUSCULAR | Status: AC
  Administered 2017-12-01: 125 mg via INTRAVENOUS
  Filled 2017-12-01: qty 2

## 2017-12-01 MED ORDER — ATORVASTATIN CALCIUM 80 MG PO TABS
80.0000 mg | ORAL_TABLET | Freq: Every day | ORAL | Status: DC
Start: 2017-12-01 — End: 2017-12-02
  Administered 2017-12-01: 80 mg via ORAL
  Filled 2017-12-01: qty 1

## 2017-12-01 MED ORDER — ASPIRIN EC 81 MG PO TBEC
81.0000 mg | DELAYED_RELEASE_TABLET | Freq: Every day | ORAL | Status: DC
  Administered 2017-12-02: 81 mg via ORAL
  Filled 2017-12-01: qty 1

## 2017-12-01 MED ORDER — DIPHENHYDRAMINE HCL 50 MG/ML IJ SOLN
25.0000 mg | Freq: Once | INTRAMUSCULAR | Status: AC
  Administered 2017-12-01: 25 mg via INTRAVENOUS
  Filled 2017-12-01: qty 1

## 2017-12-01 MED ORDER — CITALOPRAM HYDROBROMIDE 10 MG PO TABS
20.0000 mg | ORAL_TABLET | Freq: Every day | ORAL | Status: DC
  Administered 2017-12-02: 20 mg via ORAL
  Filled 2017-12-01 (×2): qty 2

## 2017-12-01 MED ORDER — NITROGLYCERIN 0.4 MG SL SUBL: 0.4000 mg | SUBLINGUAL_TABLET | SUBLINGUAL | Status: DC | PRN

## 2017-12-01 MED ORDER — HYDROXYZINE HCL 25 MG PO TABS
25.0000 mg | ORAL_TABLET | Freq: Once | ORAL | Status: AC
  Administered 2017-12-01: 25 mg via ORAL
  Filled 2017-12-01: qty 1

## 2017-12-01 MED ORDER — SENNOSIDES-DOCUSATE SODIUM 8.6-50 MG PO TABS: 1.0000 | ORAL_TABLET | Freq: Every evening | ORAL | Status: DC | PRN

## 2017-12-01 MED ORDER — FAMOTIDINE IN NACL 20-0.9 MG/50ML-% IV SOLN
20.0000 mg | Freq: Once | INTRAVENOUS | Status: AC
  Administered 2017-12-01: 20 mg via INTRAVENOUS
  Filled 2017-12-01: qty 50

## 2017-12-01 MED ORDER — CLOPIDOGREL BISULFATE 75 MG PO TABS
75.0000 mg | ORAL_TABLET | Freq: Every day | ORAL | Status: DC
  Administered 2017-12-02: 75 mg via ORAL
  Filled 2017-12-01: qty 1

## 2017-12-01 MED ORDER — ZOLPIDEM TARTRATE 5 MG PO TABS: 2.5000 mg | ORAL_TABLET | Freq: Every evening | ORAL | Status: DC | PRN

## 2017-12-01 NOTE — ED Notes (Signed)
Dr Yelverton at bedside.  

## 2017-12-01 NOTE — ED Triage Notes (Signed)
Pt arrives to ED with wife from home with complaints of nausea, allergic reaction, and fall this morning. PT reports last night he began to notice red itchy hives to neck, face, and groin. Pt took 25mg  benadryl last night and this morning with minimal improvement of symptoms. PT reports this morning he fell but is unsure why. PT states he doesn't not think there was LOC.   On arrival to ED pt is diaphoretic, pale, and nausea with dry heaving.

## 2017-12-01 NOTE — Progress Notes (Signed)
Report received from the ED. Pt arrived to the unit at 1720 via bed.

## 2017-12-01 NOTE — ED Notes (Signed)
Attempted report x1. 

## 2017-12-01 NOTE — ED Provider Notes (Signed)
[facility] EMERGENCY DEPARTMENT Provider Note   CSN: [id number] Arrival date & time: 12/01/17  1150     History   Chief Complaint Chief Complaint  Patient presents with  . Nausea  . Fall    HPI Jared Clark is a 81 y.o. male.  HPI Patient developed erythematous pruritic plaques to the anterior neck and scalp.  Also has plaques to the groin.  No new exposures.  He is been taking Benadryl for this.  States he took several doses of Benadryl throughout the night and this morning.  States he was walking and became lightheaded.  Questionable loss of consciousness.  [place] to the ground.  Denies any head trauma.  Had difficulty getting up.  Had nausea, diaphoresis when EMS arrived.  Denies current nausea.  He denies shortness of breath or chest pain at any point.  Denies headache, focal weakness or numbness.  No difficulty breathing.  No recent medication changes.   Past Medical History:  Diagnosis Date  . Borderline diabetes    DIET CONTROLLED  . BPH (benign prostatic hypertrophy)    S/P TURP 1995  . Carotid artery disease ([place])    a. Carotid duplex in 05/2013: 50-35% RICA, <46% LICA. b. Per Patient, doppler 05/2015 showed 1-39% on the R - being folloewd by primary care.  . Cataract of right eye   . Coronary artery disease    a. 2003 s/p PCI/stenting; b. 02/2014 Abnl nuc -> Cath/PCI: LM nl, LAD nl, D2 95p (2.5x12 Promus DES), LCX <20, OM1 patent stent, RCA 50d, EF 55-65%. c. 08/2015: normal nuc.  [name] Dyslipidemia    History of dyslipidemia  . GERD (gastroesophageal reflux disease)   . Hypertension   . Mild depression ([place])   . Osteoarthritis    RIGHT ANKLE, BILATERAL WRIST, BACK  . Reflux OCCASIONAL   TAKES PRILOSEC  . Sinus bradycardia     Patient Active Problem List   Diagnosis Date Noted  . Syncope 12/01/2017  . Chest pain with moderate risk for cardiac etiology 05/22/2016  . CAD S/P percutaneous coronary angioplasty 10/02/2015  . Essential  hypertension 10/02/2015  . Sinus bradycardia 10/02/2015  . Bilateral carotid artery disease ([place]) 08/29/2015  . Abnormal nuclear stress test 02/07/2014  . Benign hypertensive heart disease without heart failure 08/22/2011  . Ischemic heart disease 03/19/2011  . Dyslipidemia 03/19/2011  . BPH (benign prostatic hyperplasia) 03/19/2011  . Malaise and fatigue 03/19/2011    Past Surgical History:  Procedure Laterality Date  . CARDIAC CATHETERIZATION  02-06-09   NO INTERVENTION  . CARDIAC CATHETERIZATION N/A 05/22/2016   Procedure: Left Heart Cath and Coronary Angiography;  Surgeon: Peter M Martinique, MD;  Location: [facility];  Service: Cardiovascular;  Laterality: N/A;  . CAROTID STENT    . CERVICAL DISCECTOMY  1989   C4 - 5  . CHOLECYSTECTOMY  5681   W/ UMBILICAL HERNIA REPAIR  . CORONARY ANGIOPLASTY  2003- FIRST OBTUSE MARGIANL VESSEL    X1 CYPHER STENT  . CORONARY STENT PLACEMENT     DES to first diagonal        DR Martinique  . HERNIA REPAIR  2751   Umbilical  . INGUINAL HERNIA REPAIR  1985  . KNEE ARTHROSCOPY  11/05/2011   Procedure: ARTHROSCOPY KNEE;  Surgeon: Gearlean Alf;  Location: [facility];  Service: Orthopedics;  Laterality: Right;  RIGHT ARTHROSCOPY KNEE WITH DEBRIDEMENT, Chondroplasty  . LEFT HEART CATH AND CORONARY ANGIOGRAPHY N/A 05/01/2017  Procedure: Left Heart Cath and Coronary Angiography;  Surgeon: Martinique, Peter M, MD;  Location: [facility];  Service: Cardiovascular;  Laterality: N/A;  . LEFT HEART CATHETERIZATION WITH CORONARY ANGIOGRAM N/A 02/10/2014   Procedure: LEFT HEART CATHETERIZATION WITH CORONARY ANGIOGRAM;  Surgeon: Peter M Martinique, MD;  Location[facility] C[facility];  Service: Cardiovascular;  Laterality: N/A;  . LUMBAR LAMINECTOMY  1973   L4-S1  . PILONIDAL CYST EXCISION  1961  . SHOULDER ARTHROSCOPY     Bilateral  . TONSILLECTOMY AND ADENOIDECTOMY  CHILD  . TRANSURETHRAL RESECTION OF PROSTATE  1995       Home Medications      Prior to Admission medications   Medication Sig Start Date End Date Taking? Authorizing Provider  aspirin 81 MG tablet Take 81 mg by mouth daily.    Yes [provider]  atorvastatin (LIPITOR) 80 MG tablet Take 80 mg by mouth at bedtime.    Yes [provider]  calcium carbonate (TUMS - DOSED IN MG ELEMENTAL CALCIUM) 500 MG chewable tablet Chew 1 tablet by mouth daily as needed for indigestion or heartburn.   Yes [provider]  citalopram (CELEXA) 20 MG tablet Take 20 mg by mouth daily.   Yes [provider]  clopidogrel (PLAVIX) 75 MG tablet Take 75 mg by mouth daily.    Yes [provider]  diphenhydrAMINE (BENADRYL) 25 mg capsule Take 25-50 mg by mouth every 6 (six) hours as needed for itching.   Yes [provider]  furosemide (LASIX) 20 MG tablet Take 1 tablet (20 mg total) by mouth daily as needed for edema. 05/21/17  Yes Martinique, Peter M, MD  HYDROCORTISONE EX Apply 1 application topically daily as needed (ANKLE ITCHING).   Yes [provider]  Lidocaine, Anorectal, (RECTICARE EX) Apply 1 application topically daily as needed (ANKLE ITCHING).   Yes [provider]  losartan (COZAAR) 25 MG tablet TAKE 1 TABLET BY MOUTH  DAILY 06/15/17  Yes Burtis Junes, NP  metoprolol succinate (TOPROL-XL) 25 MG 24 hr tablet Take 12.5 mg by mouth daily.  09/09/12  Yes Darlin Coco, MD  naproxen sodium (ANAPROX) 220 MG tablet Take 220 mg by mouth daily.   Yes [provider]  nitroGLYCERIN (NITROSTAT) 0.4 MG SL tablet Place 0.4 mg under the tongue every 5 (five) minutes as needed for chest pain (x 3 tablets daily).   Yes [provider]  valACYclovir (VALTREX) 1000 MG tablet Take 1,000 mg by mouth daily as needed. Outbreak.   Yes [provider]  zolpidem (AMBIEN) 5 MG tablet Take 2.5 mg by mouth at bedtime as needed for sleep.  08/16/15  Yes [provider]    Family History Family History   Problem Relation Age of Onset  . Hypertension Mother   . Lung cancer Father   . Heart attack Neg Hx   . Stroke Neg Hx     Social History Social History   Tobacco Use  . Smoking status: Never Smoker  . Smokeless tobacco: Never Used  Substance Use Topics  . Alcohol use: Yes    Comment: RARE  . Drug use: No     Allergies   Flexeril [cyclobenzaprine]; Methocarbamol; Orphenadrine citrate; Lisinopril; and Ramipril   Review of Systems Review of Systems  Constitutional: Positive for diaphoresis. Negative for chills and fever.  HENT: Negative for sore throat and trouble swallowing.   Respiratory: Negative for cough and shortness of breath.   Cardiovascular: Negative for  chest pain, palpitations and leg swelling.  Gastrointestinal: Positive for nausea and vomiting. Negative for abdominal pain, constipation and diarrhea.  Genitourinary: Negative for dysuria, flank pain and frequency.  Musculoskeletal: Positive for neck pain. Negative for back pain and myalgias.  Skin: Positive for rash. Negative for wound.  Neurological: Positive for weakness and light-headedness. Negative for numbness and headaches.  All other systems reviewed and are negative.    Physical Exam Updated Vital Signs BP (!) 136/113 (BP Location: Right Arm)   Pulse 71   Temp 98.6 F (37 C) (Oral)   Resp (!) 23   Ht 6\' 4"  (1.93 m)   Wt 113.9 kg (251 lb)   SpO2 96%   BMI 30.55 kg/m   Physical Exam  Constitutional: He is oriented to person, place, and time. He appears well-developed and well-nourished. No distress.  HENT:  Head: Normocephalic and atraumatic.  Mouth/Throat: Oropharynx is clear and moist. No oropharyngeal exudate.  Patient has erythematous papules and plaques covering the scalp and across the lower face and anterior neck.  No intraoral trauma.  Eyes: EOM are normal. Pupils are equal, round, and reactive to light.  Neck: Normal range of motion. Neck supple.  Very mild upper midline cervical  tenderness to palpation.  No stridor.  Cardiovascular: Normal rate and regular rhythm. Exam reveals no gallop and no friction rub.  No murmur heard. Pulmonary/Chest: Effort normal and breath sounds normal. No stridor. No respiratory distress. He has no wheezes. He has no rales. He exhibits no tenderness.  Abdominal: Soft. Bowel sounds are normal. There is no tenderness. There is no rebound and no guarding.  Musculoskeletal: Normal range of motion. He exhibits no edema or tenderness.  No midline thoracic or lumbar tenderness.  Pelvis is stable.  Distal pulses are 2+.  Patient does have 1+ bilateral lower extremity pitting edema.  Neurological: He is alert and oriented to person, place, and time.  Patient is alert and oriented x3 with clear, goal oriented speech. Patient has 5/5 motor in all extremities. Sensation is intact to light touch.   Skin: Skin is warm and dry. Capillary refill takes less than 2 seconds. Rash noted. He is not diaphoretic. There is erythema.  Few scattered erythematous plaques in the groin region.  Psychiatric: He has a normal mood and affect. His behavior is normal.  Nursing note and vitals reviewed.    ED Treatments / Results  Labs (all labs ordered are listed, but only abnormal results are displayed) Labs Reviewed  CBC WITH DIFFERENTIAL/PLATELET - Abnormal; Notable for the following components:      Result Value   WBC 12.6 (*)    Neutro Abs 10.5 (*)    All other components within normal limits  COMPREHENSIVE METABOLIC PANEL - Abnormal; Notable for the following components:   CO2 19 (*)    Glucose, Bld 128 (*)    All other components within normal limits  URINALYSIS, ROUTINE W REFLEX MICROSCOPIC - Abnormal; Notable for the following components:   Color, Urine AMBER (*)    APPearance HAZY (*)    Protein, ur 30 (*)    Squamous Epithelial / LPF 0-5 (*)    All other components within normal limits  TROPONIN I    EKG  EKG Interpretation  Date/Time:  Tuesday  December 01 2017 12:19:32 EST Ventricular Rate:  85 PR Interval:    QRS Duration: 110 QT Interval:  388 QTC Calculation: 462 R Axis:   -47 Text Interpretation:  Sinus rhythm Abnormal R-wave  progression, early transition LVH with IVCD, LAD and secondary repol abnrm Anterior ST elevation, probably due to LVH Confirmed by Julianne Rice [phone]) on 12/01/2017 4:02:51 PM       Radiology Ct Head Wo Contrast  Result Date: 12/01/2017 CLINICAL DATA:  Patient status post fall this morning. Altered mental status today. EXAM: CT HEAD WITHOUT CONTRAST CT CERVICAL SPINE WITHOUT CONTRAST TECHNIQUE: Multidetector CT imaging of the head and cervical spine was performed following the standard protocol without intravenous contrast. Multiplanar CT image reconstructions of the cervical spine were also generated. COMPARISON:  Head and maxillofacial CT scans 09/09/2017. Head CT scan 10/28/2017 FINDINGS: CT HEAD FINDINGS Brain: Mild cortical atrophy is unchanged. No evidence of acute abnormality including hemorrhage, infarct, mass lesion, mass effect, midline shift or abnormal extra-axial fluid collection. No hydrocephalus or pneumocephalus. Vascular: No hyperdense vessel or unexpected calcification. Skull: Intact. Sinuses/Orbits: Status post lens extraction.  Otherwise negative. Other: None. CT CERVICAL SPINE FINDINGS Alignment: Trace anterolisthesis C2 on C3.  Otherwise maintained. Skull base and vertebrae: No acute fracture. No primary bone lesion or focal pathologic process. Soft tissues and spinal canal: No prevertebral fluid or swelling. No visible canal hematoma. Disc levels: Multilevel loss of disc space height appears worst at C5-6 and C6-7. Upper chest: Lung apices clear. Other: None. IMPRESSION: No acute abnormality head or cervical spine. Cortical atrophy. Cervical spondylosis. Electronically Signed   By: Inge Rise [name]   On: 12/01/2017 13:37   Ct Cervical Spine Wo Contrast  Result Date:  12/01/2017 CLINICAL DATA:  Patient status post fall this morning. Altered mental status today. EXAM: CT HEAD WITHOUT CONTRAST CT CERVICAL SPINE WITHOUT CONTRAST TECHNIQUE: Multidetector CT imaging of the head and cervical spine was performed following the standard protocol without intravenous contrast. Multiplanar CT image reconstructions of the cervical spine were also generated. COMPARISON:  Head and maxillofacial CT scans 09/09/2017. Head CT scan 10/28/2017 FINDINGS: CT HEAD FINDINGS Brain: Mild cortical atrophy is unchanged. No evidence of acute abnormality including hemorrhage, infarct, mass lesion, mass effect, midline shift or abnormal extra-axial fluid collection. No hydrocephalus or pneumocephalus. Vascular: No hyperdense vessel or unexpected calcification. Skull: Intact. Sinuses/Orbits: Status post lens extraction.  Otherwise negative. Other: None. CT CERVICAL SPINE FINDINGS Alignment: Trace anterolisthesis C2 on C3.  Otherwise maintained. Skull base and vertebrae: No acute fracture. No primary bone lesion or focal pathologic process. Soft tissues and spinal canal: No prevertebral fluid or swelling. No visible canal hematoma. Disc levels: Multilevel loss of disc space height appears worst at C5-6 and C6-7. Upper chest: Lung apices clear. Other: None. IMPRESSION: No acute abnormality head or cervical spine. Cortical atrophy. Cervical spondylosis. Electronically Signed   By: Inge Rise [name]   On: 12/01/2017 13:37    Procedures Procedures (including critical care time)  Medications Ordered in ED Medications  sodium chloride 0.9 % bolus 500 mL (0 mLs Intravenous Stopped 12/01/17 1324)  methylPREDNISolone sodium succinate (SOLU-MEDROL) 125 mg/2 mL injection 125 mg (125 mg Intravenous Given 12/01/17 1241)  diphenhydrAMINE (BENADRYL) injection 25 mg (25 mg Intravenous Given 12/01/17 1241)  famotidine (PEPCID) IVPB 20 mg premix (0 mg Intravenous Stopped 12/01/17 1323)  sodium chloride 0.9 %  bolus 500 mL (500 mLs Intravenous New Bag/Given 12/01/17 1416)  hydrOXYzine (ATARAX/VISTARIL) tablet 25 mg (25 mg Oral Given 12/01/17 1430)     Initial Impression / Assessment and Plan / ED Course  I have reviewed the triage vital signs and the nursing notes.  Pertinent labs & imaging results that were available during  my care of the patient were reviewed by me and considered in my medical decision making (see chart for details).     Blood pressures improved with medications and IV fluids.  Continues to have erythematous rash to the scalp and neck though there is some improvement.  No stridor or respiratory distress.  Initial hypertension has resolved.  Likely multifactorial given patient's on 2 blood pressure medications and is taking multiple doses of Benadryl as well as allergic reaction.  Discussed with hospitalist and will admit for observation.  Final Clinical Impressions(s) / ED Diagnoses   Final diagnoses:  Syncope and collapse  Hypotension, unspecified hypotension type  Urticarial rash    ED Discharge Orders    None       Julianne Rice, MD 12/01/17 [phone]

## 2017-12-01 NOTE — Progress Notes (Signed)
Jared Clark is a 81 y.o. male patient admitted from ED awake, alert - oriented  X 4 - no acute distress noted.  VSS - Blood pressure (!) 144/76, pulse 73, temperature 98.6 F (37 C), temperature source Oral, resp. rate 17, height 6\' 4"  (1.93 m), weight 113.9 kg (251 lb), SpO2 94 %.    IV in place, occlusive dsg intact without redness.  Orientation to room, and floor completed with information packet given to patient/family.  Patient declined safety video at this time.  Admission INP armband ID verified with patient/family, and in place.   SR up x 2, fall assessment complete, with patient and family able to verbalize understanding of risk associated with falls, and verbalized understanding to call nsg before up out of bed.  Call light within reach, patient able to voice, and demonstrate understanding.  Skin, clean-dry- intact without evidence of bruising, or skin tears.  Skin rash noted on patient skin due to allergic reaction. No evidence of skin break down noted on exam.     Will cont to eval and treat per MD orders.  Dorris Carnes, RN 12/01/2017 7:16 PM

## 2017-12-01 NOTE — H&P (Signed)
History and Physical  Jared Clark BMW:[id number] DOB: 1936-02-10 DOA: 12/01/2017  Referring physician: Dr. Lita Mains PCP: Marton Redwood, MD  Outpatient Specialists: Dr. Peter Martinique( Cardiology) Patient coming from:Home At his baseline ambulates independently  Chief Complaint: passed out  HPI: Jared Clark is a 81 y.o. male with medical history significant for CAD status post PCI (2015), hypertension, hyperlipidemia, GERD, diabetes (diet-controlled) who presents on 12/01/2017 with itchy rash and fall was found to have generalized pruritus and syncope of multifactorial etiology.  Patient states that Saturday he noticed hives initially started on his scalp.  He presumed it was due to dirty hair and noted some improvement after washing his hair however it progressed to his neck and upper chest.  Otherwise he was unsure of any recent exposures.  He states his only allergies are to medications (Flexeril and methocarbamol) which he has not taken recently.  He denies any recent travel, recent sick contacts, bee stings, new medications, changes in detergent.  Given the itchy nature of the rash he is taking Benadryl frequently; sometimes up to 2-3 times within a few hours per his wife.  On the day prior to his admission patient states he was walking in his kitchen when suddenly he blacked out.  He briefly remembers feeling lightheaded with maybe some temporary changes in vision, but denied any chest pain, palpitations, fevers or chills.  EMS was called and per chart review on arrival to ED patient was found to be diaphoretic, and complaining of nausea.  There is no head trauma or fall.   ED Course: Afebrile,  story rate 21-30, oxygen saturation 99%, heart rate peak 101, range 60-73, blood pressure initially 87/47 but improved to 127/69.  Lab workup significant for UA negative, CBC white count 12.6,CO2 19, glucose 128, troponin negative. CT head with no acute abnormality.  CT cervical spine no  acute abnormality.  EKG early repolarization changes (similar to previous EKGs)  Review of Systems:As mentioned in the history of present illness.Review of systems are otherwise negative Patient seen in the ED.    Past Medical History:  Diagnosis Date  . Borderline diabetes    DIET CONTROLLED  . BPH (benign prostatic hypertrophy)    S/P TURP 1995  . Carotid artery disease ([place])    a. Carotid duplex in 05/2013: 27-25% RICA, <36% LICA. b. Per Patient, doppler 05/2015 showed 1-39% on the R - being folloewd by primary care.  . Cataract of right eye   . Coronary artery disease    a. 2003 s/p PCI/stenting; b. 02/2014 Abnl nuc -> Cath/PCI: LM nl, LAD nl, D2 95p (2.5x12 Promus DES), LCX <20, OM1 patent stent, RCA 50d, EF 55-65%. c. 08/2015: normal nuc.  [name] Dyslipidemia    History of dyslipidemia  . GERD (gastroesophageal reflux disease)   . Hypertension   . Mild depression ([place])   . Osteoarthritis    RIGHT ANKLE, BILATERAL WRIST, BACK  . Reflux OCCASIONAL   TAKES PRILOSEC  . Sinus bradycardia    Past Surgical History:  Procedure Laterality Date  . CARDIAC CATHETERIZATION  02-06-09   NO INTERVENTION  . CARDIAC CATHETERIZATION N/A 05/22/2016   Procedure: Left Heart Cath and Coronary Angiography;  Surgeon: Peter M Martinique, MD;  Location: [facility];  Service: Cardiovascular;  Laterality: N/A;  . CAROTID STENT    . CERVICAL DISCECTOMY  1989   C4 - 5  . CHOLECYSTECTOMY  6440   W/ UMBILICAL HERNIA REPAIR  . CORONARY ANGIOPLASTY  2003- FIRST  OBTUSE MARGIANL VESSEL    X1 CYPHER STENT  . CORONARY STENT PLACEMENT     DES to first diagonal        DR Martinique  . HERNIA REPAIR  3474   Umbilical  . INGUINAL HERNIA REPAIR  1985  . KNEE ARTHROSCOPY  11/05/2011   Procedure: ARTHROSCOPY KNEE;  Surgeon: Gearlean Alf;  Location: [facility];  Service: Orthopedics;  Laterality: Right;  RIGHT ARTHROSCOPY KNEE WITH DEBRIDEMENT, Chondroplasty  . LEFT HEART CATH AND CORONARY  ANGIOGRAPHY N/A 05/01/2017   Procedure: Left Heart Cath and Coronary Angiography;  Surgeon: Martinique, Peter M, MD;  Location: [place] [facility];  Service: Cardiovascular;  Laterality: N/A;  . LEFT HEART CATHETERIZATION WITH CORONARY ANGIOGRAM N/A 02/10/2014   Procedure: LEFT HEART CATHETERIZATION WITH CORONARY ANGIOGRAM;  Surgeon: Peter M Martinique, MD;  Location: [facility];  Service: Cardiovascular;  Laterality: N/A;  . LUMBAR LAMINECTOMY  1973   L4-S1  . PILONIDAL CYST EXCISION  1961  . SHOULDER ARTHROSCOPY     Bilateral  . TONSILLECTOMY AND ADENOIDECTOMY  CHILD  . TRANSURETHRAL RESECTION OF PROSTATE  1995    Social History:  reports that  has never smoked. he has never used smokeless tobacco. He reports that he drinks alcohol. He reports that he does not use drugs.   Allergies  Allergen Reactions  . Flexeril [Cyclobenzaprine] Swelling  . Methocarbamol Swelling  . Orphenadrine Citrate Swelling  . Lisinopril Cough  . Ramipril Other (See Comments)    DRY COUGH    Family History  Problem Relation Age of Onset  . Hypertension Mother   . Lung cancer Father   . Heart attack Neg Hx   . Stroke Neg Hx       Prior to Admission medications   Medication Sig Start Date End Date Taking? Authorizing Provider  aspirin 81 MG tablet Take 81 mg by mouth daily.    Yes [provider]  atorvastatin (LIPITOR) 80 MG tablet Take 80 mg by mouth at bedtime.    Yes [provider]  calcium carbonate (TUMS - DOSED IN MG ELEMENTAL CALCIUM) 500 MG chewable tablet Chew 1 tablet by mouth daily as needed for indigestion or heartburn.   Yes [provider]  citalopram (CELEXA) 20 MG tablet Take 20 mg by mouth daily.   Yes [provider]  clopidogrel (PLAVIX) 75 MG tablet Take 75 mg by mouth daily.    Yes [provider]  diphenhydrAMINE (BENADRYL) 25 mg capsule Take 25-50 mg by mouth every 6 (six) hours as needed for itching.   Yes [provider]    furosemide (LASIX) 20 MG tablet Take 1 tablet (20 mg total) by mouth daily as needed for edema. 05/21/17  Yes Martinique, Peter M, MD  HYDROCORTISONE EX Apply 1 application topically daily as needed (ANKLE ITCHING).   Yes [provider]  Lidocaine, Anorectal, (RECTICARE EX) Apply 1 application topically daily as needed (ANKLE ITCHING).   Yes [provider]  losartan (COZAAR) 25 MG tablet TAKE 1 TABLET BY MOUTH  DAILY 06/15/17  Yes Burtis Junes, NP  metoprolol succinate (TOPROL-XL) 25 MG 24 hr tablet Take 12.5 mg by mouth daily.  09/09/12  Yes Darlin Coco, MD  naproxen sodium (ANAPROX) 220 MG tablet Take 220 mg by mouth daily.   Yes [provider]  nitroGLYCERIN (NITROSTAT) 0.4 MG SL tablet Place 0.4 mg under the tongue every 5 (five) minutes as needed for chest pain (x  3 tablets daily).   Yes [provider]  valACYclovir (VALTREX) 1000 MG tablet Take 1,000 mg by mouth daily as needed. Outbreak.   Yes [provider]  zolpidem (AMBIEN) 5 MG tablet Take 2.5 mg by mouth at bedtime as needed for sleep.  08/16/15  Yes [provider]    Physical Exam: BP (!) 144/76   Pulse 73   Temp 98.6 F (37 C) (Oral)   Resp 17   Ht 6\' 4"  (1.93 m)   Wt 113.9 kg (251 lb)   SpO2 94%   BMI 30.55 kg/m    General: Lying in bed in Appear in no distress Eyes: EOMI, anicteric ENT: Oral Mucosa clear moist Neck: normal, supple, no masses, no JVD Cardiovascular: regular rate and rhythm, no Murmurs, rubs or gallops. Peripheral Pulses present. No edema Respiratory: Normal respiratory effort, clear breath sounds on auscultation bilaterally of anterior transfer, no appreciable stridor., Abdomen: soft, non-distended, non-tender, normal bowel sounds, no guarding or rebound tenderness Skin: Multiple coalescing, erythematous plaques on scalp, midline of neck, upper chest, bilateral groin area, and sporadically in the lower legs. Musculoskeletal:No clubbing /  cyanosis. No joint deformity upper and lower extremities. Good ROM, no contractures. Normal muscle tone Neurologic: Grossly no focal neuro deficit.alert to person, place, and time.  Occasionally is oriented to context Psychiatric: Appropriate affect, and mood          Labs on Admission:  Basic Metabolic Panel: Recent Labs  Lab 12/01/17 1231  NA 136  K 4.1  CL 106  CO2 19*  GLUCOSE 128*  BUN 17  CREATININE 0.99  CALCIUM 9.5   Liver Function Tests: Recent Labs  Lab 12/01/17 1231  AST 31  ALT 22  ALKPHOS 67  BILITOT 0.9  PROT 6.9  ALBUMIN 3.7   No results for input(s): LIPASE, AMYLASE in the last 168 hours. No results for input(s): AMMONIA in the last 168 hours. CBC: Recent Labs  Lab 12/01/17 1231  WBC 12.6*  NEUTROABS 10.5*  HGB 16.7  HCT 48.3  MCV 91.1  PLT 221   Cardiac Enzymes: Recent Labs  Lab 12/01/17 1231  TROPONINI <0.03    BNP (last 3 results) No results for input(s): BNP in the last 8760 hours.  ProBNP (last 3 results) No results for input(s): PROBNP in the last 8760 hours.  CBG: No results for input(s): GLUCAP in the last 168 hours.  Radiological Exams on Admission: Ct Head Wo Contrast  Result Date: 12/01/2017 CLINICAL DATA:  Patient status post fall this morning. Altered mental status today. EXAM: CT HEAD WITHOUT CONTRAST CT CERVICAL SPINE WITHOUT CONTRAST TECHNIQUE: Multidetector CT imaging of the head and cervical spine was performed following the standard protocol without intravenous contrast. Multiplanar CT image reconstructions of the cervical spine were also generated. COMPARISON:  Head and maxillofacial CT scans 09/09/2017. Head CT scan 10/28/2017 FINDINGS: CT HEAD FINDINGS Brain: Mild cortical atrophy is unchanged. No evidence of acute abnormality including hemorrhage, infarct, mass lesion, mass effect, midline shift or abnormal extra-axial fluid collection. No hydrocephalus or pneumocephalus. Vascular: No hyperdense vessel or  unexpected calcification. Skull: Intact. Sinuses/Orbits: Status post lens extraction.  Otherwise negative. Other: None. CT CERVICAL SPINE FINDINGS Alignment: Trace anterolisthesis C2 on C3.  Otherwise maintained. Skull base and vertebrae: No acute fracture. No primary bone lesion or focal pathologic process. Soft tissues and spinal canal: No prevertebral fluid or swelling. No visible canal hematoma. Disc levels: Multilevel loss of disc space height appears worst at C5-6 and C6-7.  Upper chest: Lung apices clear. Other: None. IMPRESSION: No acute abnormality head or cervical spine. Cortical atrophy. Cervical spondylosis. Electronically Signed   By: Inge Rise [name]   On: 12/01/2017 13:37   Ct Cervical Spine Wo Contrast  Result Date: 12/01/2017 CLINICAL DATA:  Patient status post fall this morning. Altered mental status today. EXAM: CT HEAD WITHOUT CONTRAST CT CERVICAL SPINE WITHOUT CONTRAST TECHNIQUE: Multidetector CT imaging of the head and cervical spine was performed following the standard protocol without intravenous contrast. Multiplanar CT image reconstructions of the cervical spine were also generated. COMPARISON:  Head and maxillofacial CT scans 09/09/2017. Head CT scan 10/28/2017 FINDINGS: CT HEAD FINDINGS Brain: Mild cortical atrophy is unchanged. No evidence of acute abnormality including hemorrhage, infarct, mass lesion, mass effect, midline shift or abnormal extra-axial fluid collection. No hydrocephalus or pneumocephalus. Vascular: No hyperdense vessel or unexpected calcification. Skull: Intact. Sinuses/Orbits: Status post lens extraction.  Otherwise negative. Other: None. CT CERVICAL SPINE FINDINGS Alignment: Trace anterolisthesis C2 on C3.  Otherwise maintained. Skull base and vertebrae: No acute fracture. No primary bone lesion or focal pathologic process. Soft tissues and spinal canal: No prevertebral fluid or swelling. No visible canal hematoma. Disc levels: Multilevel loss of disc space  height appears worst at C5-6 and C6-7. Upper chest: Lung apices clear. Other: None. IMPRESSION: No acute abnormality head or cervical spine. Cortical atrophy. Cervical spondylosis. Electronically Signed   By: Inge Rise [name]   On: 12/01/2017 13:37     Assessment/Plan Present on Admission: . Syncope . Ischemic heart disease . Dyslipidemia . Benign hypertensive heart disease without heart failure  Active Problems:   Ischemic heart disease   Dyslipidemia   Benign hypertensive heart disease without heart failure   CAD S/P percutaneous coronary angioplasty   Syncope   Hives   Syncope, suspect related to medications/orthostatics.  Frequent use of Benadryl in the setting of multiple blood pressure medications likely increased risk of orthostatic syncope. -Obtain orthostatics (expect to be negative patient received fluid resuscitation ED) - We will hold home blood pressure medications for blood pressure stabilizes -Repeat orthostatics in a.m. prior to possible discharge home -Advised patient to avoid frequent Benadryl use -Monitor on telemetry  Generalized hives, unclear etiology.  Patient not on ACE inhibitors (proven cause of angioedema).  Additionally no other new medications or exposures.  Patient responded to Medrol and Pepcid given in the ED.  No concern for anaphylaxis given no epinephrine required and syncope can be explained by multiple blood pressure medications and Benadryl..  There was some concern for possible anaphylactic reaction however generalized hives going on for several days does not fit typical presentation of anaphylaxis albeit patient did syncopize -Observe over the next 24 hours -If condition continues to improve, likely discharge home tomorrow - No evidence of prednisone taper is needed in situations of disease -Would encourage supportive treatment at home with localized skin medications and avoiding Benadryl and another medications that could increase risk for  drowsiness.  Hypertension, relative hypotension initially on admission.  Blood pressure stable -We will home home blood pressure medications during 24-hour observation.  CAD status post PCI (2015), stable -Continue aspirin and Plavix  Lipidemia -Continue home atorvastatin  DVT prophylaxis: SCDs Code Status: Full code  Family Communication: Family was present at bedside and updated appropriately at the time of interview.   Disposition Plan: Observe over next 24 hours: Monitor blood pressure   Consults called: None Admission status: Admitted as observation telemetry unit.      Myrlene Broker  Dallie Dad MD [facility]  Pager [phone]  If 7PM-7AM, please contact night-coverage [email or web address] Password [facility]  12/01/2017, 7:19 PM

## 2017-12-02 DIAGNOSIS — I959 Hypotension, unspecified: Secondary | ICD-10-CM | POA: Diagnosis not present

## 2017-12-02 DIAGNOSIS — I259 Chronic ischemic heart disease, unspecified: Secondary | ICD-10-CM

## 2017-12-02 DIAGNOSIS — Z9861 Coronary angioplasty status: Secondary | ICD-10-CM | POA: Diagnosis not present

## 2017-12-02 DIAGNOSIS — L509 Urticaria, unspecified: Secondary | ICD-10-CM

## 2017-12-02 DIAGNOSIS — E785 Hyperlipidemia, unspecified: Secondary | ICD-10-CM

## 2017-12-02 DIAGNOSIS — I119 Hypertensive heart disease without heart failure: Secondary | ICD-10-CM | POA: Diagnosis not present

## 2017-12-02 DIAGNOSIS — R55 Syncope and collapse: Secondary | ICD-10-CM | POA: Diagnosis not present

## 2017-12-02 DIAGNOSIS — I2510 Atherosclerotic heart disease of native coronary artery without angina pectoris: Secondary | ICD-10-CM | POA: Diagnosis not present

## 2017-12-02 MED ORDER — HYDROXYZINE HCL 10 MG PO TABS
10.0000 mg | ORAL_TABLET | Freq: Three times a day (TID) | ORAL | Status: DC
  Administered 2017-12-02: 10 mg via ORAL
  Filled 2017-12-02 (×2): qty 1

## 2017-12-02 MED ORDER — METHYLPREDNISOLONE SODIUM SUCC 40 MG IJ SOLR
40.0000 mg | Freq: Two times a day (BID) | INTRAMUSCULAR | Status: DC
  Administered 2017-12-02: 40 mg via INTRAVENOUS
  Filled 2017-12-02: qty 1

## 2017-12-02 MED ORDER — NYSTATIN-TRIAMCINOLONE 100000-0.1 UNIT/GM-% EX OINT
TOPICAL_OINTMENT | Freq: Two times a day (BID) | CUTANEOUS | Status: DC
  Administered 2017-12-02: 13:00:00 via TOPICAL
  Filled 2017-12-02: qty 15

## 2017-12-02 MED ORDER — KETOCONAZOLE 2 % EX SHAM: 1.0000 "application " | MEDICATED_SHAMPOO | CUTANEOUS | 0 refills | Status: DC

## 2017-12-02 MED ORDER — PREDNISONE 10 MG PO TABS: 10.0000 mg | ORAL_TABLET | Freq: Every day | ORAL | 0 refills | Status: AC

## 2017-12-02 MED ORDER — NYSTATIN-TRIAMCINOLONE 100000-0.1 UNIT/GM-% EX OINT: TOPICAL_OINTMENT | Freq: Two times a day (BID) | CUTANEOUS | 0 refills | Status: DC

## 2017-12-02 MED ORDER — HYDROXYZINE HCL 10 MG PO TABS: 10.0000 mg | ORAL_TABLET | Freq: Three times a day (TID) | ORAL | 0 refills | Status: DC

## 2017-12-02 NOTE — Evaluation (Signed)
Physical Therapy Evaluation Patient Details Name: Jared Clark MRN: [id number] DOB: 01-20-1936 Today's Date: 12/02/2017   History of Present Illness  Patient is a 81 y/o male who presents with allergic reaction/rash, nausea and syncopal episode at home resulting in fall. BP initially 87/47 but improved to 127/69. Head CT/cervical spine-unremarkable. PMH includes depression, HTN, CAd, sinus bradycardia.   Clinical Impression  Patient tolerated transfers and gait training with mod I assist with no overt LOB even during stepping over objects and head turns. Pt independent PTA, drives and participates in chair vball. Reports some difficulty standing from low surfaces without using UEs which he is seeing a PT for PTA. Today, pt presents close to functional baseline and does not require skilled therapy services. All education completed. Discharge from therapy.    Follow Up Recommendations No PT follow up;Supervision - Intermittent    Equipment Recommendations  None recommended by PT    Recommendations for Other Services       Precautions / Restrictions Precautions Precautions: Fall Restrictions Weight Bearing Restrictions: No      Mobility  Bed Mobility Overal bed mobility: Modified Independent             General bed mobility comments: use of momentum to get to EOB, no use of rails.   Transfers Overall transfer level: Modified independent               General transfer comment: Stood from EOB without assist; slow to stand. No dizziness.   Ambulation/Gait Ambulation/Gait assistance: Modified independent (Device/Increase time) Ambulation Distance (Feet): 200 Feet Assistive device: None Gait Pattern/deviations: Step-through pattern;Decreased stride length     General Gait Details: Mostly steady gait; able to tolerate some higher level balance without difficulty or LOB. HR ranged from 56-80 bpm. 2/4 DOE. Reports as baseline.  Stairs            Wheelchair  Mobility    Modified Rankin (Stroke Patients Only)       Balance Overall balance assessment: Needs assistance Sitting-balance support: Feet supported;No upper extremity supported Sitting balance-Leahy Scale: Good     Standing balance support: During functional activity Standing balance-Leahy Scale: Good                               Pertinent Vitals/Pain Pain Assessment: No/denies pain    Home Living Family/patient expects to be discharged to:: Private residence Living Arrangements: Spouse/significant other Available Help at Discharge: Family;Available PRN/intermittently Type of Home: House Home Access: Level entry     Home Layout: One level Home Equipment: None      Prior Function Level of Independence: Independent         Comments: Drives, cooks, cleans. Recently started seeing a PT [facility], also plays chair vball.      Hand Dominance        Extremity/Trunk Assessment   Upper Extremity Assessment Upper Extremity Assessment: Defer to OT evaluation    Lower Extremity Assessment Lower Extremity Assessment: Generalized weakness(Difficulty standing from low surfaces)    Cervical / Trunk Assessment Cervical / Trunk Assessment: Kyphotic  Communication   Communication: No difficulties(some stuttering noted. )  Cognition Arousal/Alertness: Awake/alert Behavior During Therapy: WFL for tasks assessed/performed Overall Cognitive Status: Within Functional Limits for tasks assessed  General Comments General comments (skin integrity, edema, etc.): BP 153/75 post ambulation.    Exercises     Assessment/Plan    PT Assessment Patent does not need any further PT services  PT Problem List         PT Treatment Interventions      PT Goals (Current goals can be found in the Care Plan section)  Acute Rehab PT Goals Patient Stated Goal: to go home PT Goal Formulation: All assessment and  education complete, DC therapy    Frequency     Barriers to discharge        Co-evaluation               AM-PAC PT "6 Clicks" Daily Activity  Outcome Measure Difficulty turning over in bed (including adjusting bedclothes, sheets and blankets)?: None Difficulty moving from lying on back to sitting on the side of the bed? : None Difficulty sitting down on and standing up from a chair with arms (e.g., wheelchair, bedside commode, etc,.)?: None Help needed moving to and from a bed to chair (including a wheelchair)?: None Help needed walking in hospital room?: None Help needed climbing 3-5 steps with a railing? : A Little 6 Click Score: 23    End of Session Equipment Utilized During Treatment: Gait belt Activity Tolerance: Patient tolerated treatment well Patient left: in bed;with call bell/phone within reach(sitting EOB.) Nurse Communication: Mobility status PT Visit Diagnosis: Unsteadiness on feet (R26.81);Difficulty in walking, not elsewhere classified (R26.2)    Time: 4076-8088 PT Time Calculation (min) (ACUTE ONLY): 21 min   Charges:   PT Evaluation $PT Eval Low Complexity: 1 Low     PT G Codes:   PT G-Codes **NOT FOR INPATIENT CLASS** Functional Assessment Tool Used: Clinical judgement Functional Limitation: Mobility: Walking and moving around Mobility: Walking and Moving Around Current Status (P1031): At least 1 percent but less than 20 percent impaired, limited or restricted Mobility: Walking and Moving Around Goal Status [phone]): At least 1 percent but less than 20 percent impaired, limited or restricted Mobility: Walking and Moving Around Discharge Status [phone]): At least 1 percent but less than 20 percent impaired, limited or restricted    [place], [place], [place] [phone]    Lacie Draft 12/02/2017, 12:12 PM

## 2017-12-02 NOTE — Care Management Obs Status (Signed)
MEDICARE OBSERVATION STATUS NOTIFICATION   Patient Details  Name: Jared Clark MRN: [id number] Date of Birth: Nov 01, 1936   Medicare Observation Status Notification Given:  Yes    Sharin Mons, RN 12/02/2017, 2:12 PM

## 2017-12-02 NOTE — Discharge Summary (Signed)
Discharge Summary  NHIA HEAPHY WUJ:[id number] DOB: 08/07/1936  PCP: Marton Redwood, MD  Admit date: 12/01/2017 Discharge date: 12/02/2017  Time spent: <30 mins  Recommendations for Outpatient Follow-up:  1. PCP 2. Dermatology   Discharge Diagnoses:  Active Hospital Problems   Diagnosis Date Noted  . Syncope 12/01/2017  . Hives 12/01/2017  . CAD S/P percutaneous coronary angioplasty 10/02/2015  . Benign hypertensive heart disease without heart failure 08/22/2011  . Ischemic heart disease 03/19/2011  . Dyslipidemia 03/19/2011    Resolved Hospital Problems  No resolved problems to display.    Discharge Condition: Stable  Diet recommendation: Heart healthy  Vitals:   12/02/17 0416 12/02/17 1307  BP: (!) 119/56 (!) 127/58  Pulse: 66 61  Resp: 20 (!) 22  Temp: 98.3 F (36.8 C) 98.1 F (36.7 C)  SpO2: 94% 92%    History of present illness:  Jared Clark is a 81 y.o. male with medical history significant for CAD status post PCI (2015), hypertension, hyperlipidemia, GERD, diabetes (diet-controlled) who presents on 12/01/2017 with itchy rash and fall was found to have generalized pruritus and syncope of multifactorial etiology. Patient states that Saturday he noticed hives initially started on his scalp.  He presumed it was due to dirty hair and noted some improvement after washing his hair however it progressed to his neck and upper chest.  Otherwise he was unsure of any recent exposures.  He states his only allergies are to medications (Flexeril and methocarbamol) which he has not taken recently.  He denies any recent travel, recent sick contacts, bee stings, new medications, changes in detergent.  Given the itchy nature of the rash he is taking Benadryl frequently; sometimes up to 2-3 times within a few hours per his wife. On the day prior to his admission patient states he was walking in his kitchen when suddenly he blacked out.  He briefly remembers feeling lightheaded  with maybe some temporary changes in vision, but denied any chest pain, palpitations, fevers or chills.  EMS was called and per chart review on arrival to ED patient was found to be diaphoretic, and complaining of nausea.  There is no head trauma or fall. In the ED, pt remained stable, CT head negative, no focal neurologic deficit. Pt admitted for further management.   Today, pt reported feeling much better, denies any new symptoms, denies chest pain, SOB, abdominal pain, fever/chills, worsening rash. Pt stable for discharge with close follow up with PCP and dermatologist  Hospital Course:  Active Problems:   Ischemic heart disease   Dyslipidemia   Benign hypertensive heart disease without heart failure   CAD S/P percutaneous coronary angioplasty   Syncope   Hives  #Syncope Suspect related to medications/orthostatics.  Frequent use of Benadryl in the setting of multiple blood pressure medications likely increased risk of orthostatic syncope CT head negative No focal neurologic deficit PT/OT evaluated, no further recs Advised to avoid benadryl for now PCP follow up  #Generalized hives/rashes unclear etiology Hx of ?? Psoriasis as per pt Vs fungal rash, scalp noted with dandruff Vs Eczema Additionally no other new medications or exposures Patient responded to Medrol and Pepcid given in the ED Started pt on nystatin-triamcinolone ointment, ketoconazole shampoo, PO steroid 10mg  X 5 days, hydroxyzine prn (advised pt to use only with very severe itching) Encouraged pt to stay very moisturized Strongly encouraged pt to follow up with PCP and DERMATOLOGY  #Hypertension BP stable, continue home meds  #CAD status post PCI (2015), stable -  Continue aspirin and Plavix  #Lipidemia -Continue home atorvastatin   Procedures:  None  Consultations:  None   Discharge Exam: BP (!) 127/58 (BP Location: Left Arm)   Pulse 61   Temp 98.1 F (36.7 C) (Oral)   Resp (!) 22   Ht 6\' 4"   (1.93 m)   Wt 113.9 kg (251 lb)   SpO2 92%   BMI 30.55 kg/m   General: AAO X 3, generalized pruritic rash noted around upper chest, lower legs, groin Cardiovascular: S1-S2 present, no added hrt sound Respiratory: Chest clear bilaterally  Discharge Instructions You were cared for by a hospitalist during your hospital stay. If you have any questions about your discharge medications or the care you received while you were in the hospital after you are discharged, you can call the unit and asked to speak with the hospitalist on call if the hospitalist that took care of you is not available. Once you are discharged, your primary care physician will handle any further medical issues. Please note that NO REFILLS for any discharge medications will be authorized once you are discharged, as it is imperative that you return to your primary care physician (or establish a relationship with a primary care physician if you do not have one) for your aftercare needs so that they can reassess your need for medications and monitor your lab values.  Discharge Instructions    Diet - low sodium heart healthy   Complete by:  As directed    Increase activity slowly   Complete by:  As directed      Allergies as of 12/02/2017      Reactions   Flexeril [cyclobenzaprine] Swelling   Methocarbamol Swelling   Orphenadrine Citrate Swelling   Lisinopril Cough   Ramipril Other (See Comments)   DRY COUGH      Medication List    STOP taking these medications   diphenhydrAMINE 25 mg capsule Commonly known as:  BENADRYL   HYDROCORTISONE EX     TAKE these medications   aspirin 81 MG tablet Take 81 mg by mouth daily.   atorvastatin 80 MG tablet Commonly known as:  LIPITOR Take 80 mg by mouth at bedtime.   calcium carbonate 500 MG chewable tablet Commonly known as:  TUMS - dosed in mg elemental calcium Chew 1 tablet by mouth daily as needed for indigestion or heartburn.   citalopram 20 MG tablet Commonly  known as:  CELEXA Take 20 mg by mouth daily.   clopidogrel 75 MG tablet Commonly known as:  PLAVIX Take 75 mg by mouth daily.   furosemide 20 MG tablet Commonly known as:  LASIX Take 1 tablet (20 mg total) by mouth daily as needed for edema.   hydrOXYzine 10 MG tablet Commonly known as:  ATARAX/VISTARIL Take 1 tablet (10 mg total) by mouth 3 (three) times daily.   ketoconazole 2 % shampoo Commonly known as:  NIZORAL Apply 1 application topically 2 (two) times a week. Start taking on:  12/03/2017   losartan 25 MG tablet Commonly known as:  COZAAR TAKE 1 TABLET BY MOUTH  DAILY   metoprolol succinate 25 MG 24 hr tablet Commonly known as:  TOPROL-XL Take 12.5 mg by mouth daily.   naproxen sodium 220 MG tablet Commonly known as:  ALEVE Take 220 mg by mouth daily.   NITROSTAT 0.4 MG SL tablet Generic drug:  nitroGLYCERIN Place 0.4 mg under the tongue every 5 (five) minutes as needed for chest pain (x 3 tablets daily).  nystatin-triamcinolone ointment Commonly known as:  MYCOLOG Apply topically 2 (two) times daily.   predniSONE 10 MG tablet Commonly known as:  DELTASONE Take 1 tablet (10 mg total) by mouth daily for 5 days.   RECTICARE EX Apply 1 application topically daily as needed (ANKLE ITCHING).   valACYclovir 1000 MG tablet Commonly known as:  VALTREX Take 1,000 mg by mouth daily as needed. Outbreak.   zolpidem 5 MG tablet Commonly known as:  AMBIEN Take 2.5 mg by mouth at bedtime as needed for sleep.      Allergies  Allergen Reactions  . Flexeril [Cyclobenzaprine] Swelling  . Methocarbamol Swelling  . Orphenadrine Citrate Swelling  . Lisinopril Cough  . Ramipril Other (See Comments)    DRY COUGH   Follow-up Information    Marton Redwood, MD. Schedule an appointment as soon as possible for a visit in 1 week(s).   Specialty:  Internal Medicine Contact information: [place] [id number] [phone]            The results  of significant diagnostics from this hospitalization (including imaging, microbiology, ancillary and laboratory) are listed below for reference.    Significant Diagnostic Studies: Ct Head Wo Contrast  Result Date: 12/01/2017 CLINICAL DATA:  Patient status post fall this morning. Altered mental status today. EXAM: CT HEAD WITHOUT CONTRAST CT CERVICAL SPINE WITHOUT CONTRAST TECHNIQUE: Multidetector CT imaging of the head and cervical spine was performed following the standard protocol without intravenous contrast. Multiplanar CT image reconstructions of the cervical spine were also generated. COMPARISON:  Head and maxillofacial CT scans 09/09/2017. Head CT scan 10/28/2017 FINDINGS: CT HEAD FINDINGS Brain: Mild cortical atrophy is unchanged. No evidence of acute abnormality including hemorrhage, infarct, mass lesion, mass effect, midline shift or abnormal extra-axial fluid collection. No hydrocephalus or pneumocephalus. Vascular: No hyperdense vessel or unexpected calcification. Skull: Intact. Sinuses/Orbits: Status post lens extraction.  Otherwise negative. Other: None. CT CERVICAL SPINE FINDINGS Alignment: Trace anterolisthesis C2 on C3.  Otherwise maintained. Skull base and vertebrae: No acute fracture. No primary bone lesion or focal pathologic process. Soft tissues and spinal canal: No prevertebral fluid or swelling. No visible canal hematoma. Disc levels: Multilevel loss of disc space height appears worst at C5-6 and C6-7. Upper chest: Lung apices clear. Other: None. IMPRESSION: No acute abnormality head or cervical spine. Cortical atrophy. Cervical spondylosis. Electronically Signed   By: Inge Rise [name]   On: 12/01/2017 13:37   Ct Cervical Spine Wo Contrast  Result Date: 12/01/2017 CLINICAL DATA:  Patient status post fall this morning. Altered mental status today. EXAM: CT HEAD WITHOUT CONTRAST CT CERVICAL SPINE WITHOUT CONTRAST TECHNIQUE: Multidetector CT imaging of the head and cervical spine  was performed following the standard protocol without intravenous contrast. Multiplanar CT image reconstructions of the cervical spine were also generated. COMPARISON:  Head and maxillofacial CT scans 09/09/2017. Head CT scan 10/28/2017 FINDINGS: CT HEAD FINDINGS Brain: Mild cortical atrophy is unchanged. No evidence of acute abnormality including hemorrhage, infarct, mass lesion, mass effect, midline shift or abnormal extra-axial fluid collection. No hydrocephalus or pneumocephalus. Vascular: No hyperdense vessel or unexpected calcification. Skull: Intact. Sinuses/Orbits: Status post lens extraction.  Otherwise negative. Other: None. CT CERVICAL SPINE FINDINGS Alignment: Trace anterolisthesis C2 on C3.  Otherwise maintained. Skull base and vertebrae: No acute fracture. No primary bone lesion or focal pathologic process. Soft tissues and spinal canal: No prevertebral fluid or swelling. No visible canal hematoma. Disc levels: Multilevel loss of disc space height appears worst at  C5-6 and C6-7. Upper chest: Lung apices clear. Other: None. IMPRESSION: No acute abnormality head or cervical spine. Cortical atrophy. Cervical spondylosis. Electronically Signed   By: Inge Rise [name]   On: 12/01/2017 13:37    Microbiology: No results found for this or any previous visit (from the past 240 hour(s)).   Labs: Basic Metabolic Panel: Recent Labs  Lab 12/01/17 1231  NA 136  K 4.1  CL 106  CO2 19*  GLUCOSE 128*  BUN 17  CREATININE 0.99  CALCIUM 9.5   Liver Function Tests: Recent Labs  Lab 12/01/17 1231  AST 31  ALT 22  ALKPHOS 67  BILITOT 0.9  PROT 6.9  ALBUMIN 3.7   No results for input(s): LIPASE, AMYLASE in the last 168 hours. No results for input(s): AMMONIA in the last 168 hours. CBC: Recent Labs  Lab 12/01/17 1231  WBC 12.6*  NEUTROABS 10.5*  HGB 16.7  HCT 48.3  MCV 91.1  PLT 221   Cardiac Enzymes: Recent Labs  Lab 12/01/17 1231  TROPONINI <0.03   BNP: BNP (last 3  results) No results for input(s): BNP in the last 8760 hours.  ProBNP (last 3 results) No results for input(s): PROBNP in the last 8760 hours.  CBG: No results for input(s): GLUCAP in the last 168 hours.     Signed:  Alma Friendly, MD [facility] 12/02/2017, 5:16 PM

## 2017-12-02 NOTE — Progress Notes (Signed)
Patient discharge teaching given, including activity, diet, follow-up appoints, and medications. Patient verbalized understanding of all discharge instructions. IV access was d/c'd. Vitals are stable. Skin is intact except as charted in most recent assessments. Pt to be escorted out by NT, to be driven home by family. 

## 2017-12-03 DIAGNOSIS — L308 Other specified dermatitis: Secondary | ICD-10-CM | POA: Diagnosis not present

## 2017-12-03 DIAGNOSIS — L509 Urticaria, unspecified: Secondary | ICD-10-CM | POA: Diagnosis not present

## 2017-12-07 DIAGNOSIS — M47816 Spondylosis without myelopathy or radiculopathy, lumbar region: Secondary | ICD-10-CM | POA: Diagnosis not present

## 2017-12-11 DIAGNOSIS — M47816 Spondylosis without myelopathy or radiculopathy, lumbar region: Secondary | ICD-10-CM | POA: Diagnosis not present

## 2017-12-15 DIAGNOSIS — M47816 Spondylosis without myelopathy or radiculopathy, lumbar region: Secondary | ICD-10-CM | POA: Diagnosis not present

## 2017-12-22 DIAGNOSIS — M47816 Spondylosis without myelopathy or radiculopathy, lumbar region: Secondary | ICD-10-CM | POA: Diagnosis not present

## 2017-12-29 DIAGNOSIS — M47816 Spondylosis without myelopathy or radiculopathy, lumbar region: Secondary | ICD-10-CM | POA: Diagnosis not present

## 2018-01-01 DIAGNOSIS — M47816 Spondylosis without myelopathy or radiculopathy, lumbar region: Secondary | ICD-10-CM | POA: Diagnosis not present

## 2018-01-05 DIAGNOSIS — M47816 Spondylosis without myelopathy or radiculopathy, lumbar region: Secondary | ICD-10-CM | POA: Diagnosis not present

## 2018-01-07 DIAGNOSIS — M47816 Spondylosis without myelopathy or radiculopathy, lumbar region: Secondary | ICD-10-CM | POA: Diagnosis not present

## 2018-01-12 DIAGNOSIS — M47816 Spondylosis without myelopathy or radiculopathy, lumbar region: Secondary | ICD-10-CM | POA: Diagnosis not present

## 2018-01-15 DIAGNOSIS — M47816 Spondylosis without myelopathy or radiculopathy, lumbar region: Secondary | ICD-10-CM | POA: Diagnosis not present

## 2018-01-18 DIAGNOSIS — M47816 Spondylosis without myelopathy or radiculopathy, lumbar region: Secondary | ICD-10-CM | POA: Diagnosis not present

## 2018-01-25 DIAGNOSIS — M47816 Spondylosis without myelopathy or radiculopathy, lumbar region: Secondary | ICD-10-CM | POA: Diagnosis not present

## 2018-01-26 DIAGNOSIS — M545 Low back pain: Secondary | ICD-10-CM | POA: Diagnosis not present

## 2018-01-26 DIAGNOSIS — R972 Elevated prostate specific antigen [PSA]: Secondary | ICD-10-CM | POA: Diagnosis not present

## 2018-02-05 DIAGNOSIS — B009 Herpesviral infection, unspecified: Secondary | ICD-10-CM | POA: Diagnosis not present

## 2018-02-05 DIAGNOSIS — N401 Enlarged prostate with lower urinary tract symptoms: Secondary | ICD-10-CM | POA: Diagnosis not present

## 2018-02-05 DIAGNOSIS — N138 Other obstructive and reflux uropathy: Secondary | ICD-10-CM | POA: Diagnosis not present

## 2018-02-10 DIAGNOSIS — M47816 Spondylosis without myelopathy or radiculopathy, lumbar region: Secondary | ICD-10-CM | POA: Diagnosis not present

## 2018-06-15 DIAGNOSIS — R7301 Impaired fasting glucose: Secondary | ICD-10-CM | POA: Diagnosis not present

## 2018-06-15 DIAGNOSIS — E7849 Other hyperlipidemia: Secondary | ICD-10-CM | POA: Diagnosis not present

## 2018-06-15 DIAGNOSIS — Z125 Encounter for screening for malignant neoplasm of prostate: Secondary | ICD-10-CM | POA: Diagnosis not present

## 2018-06-15 DIAGNOSIS — I10 Essential (primary) hypertension: Secondary | ICD-10-CM | POA: Diagnosis not present

## 2018-06-18 DIAGNOSIS — Z1212 Encounter for screening for malignant neoplasm of rectum: Secondary | ICD-10-CM | POA: Diagnosis not present

## 2018-06-22 DIAGNOSIS — R7301 Impaired fasting glucose: Secondary | ICD-10-CM | POA: Diagnosis not present

## 2018-06-22 DIAGNOSIS — Z86010 Personal history of colonic polyps: Secondary | ICD-10-CM | POA: Diagnosis not present

## 2018-06-22 DIAGNOSIS — G3184 Mild cognitive impairment, so stated: Secondary | ICD-10-CM | POA: Diagnosis not present

## 2018-06-22 DIAGNOSIS — Z1389 Encounter for screening for other disorder: Secondary | ICD-10-CM | POA: Diagnosis not present

## 2018-06-22 DIAGNOSIS — K219 Gastro-esophageal reflux disease without esophagitis: Secondary | ICD-10-CM | POA: Diagnosis not present

## 2018-06-22 DIAGNOSIS — E7849 Other hyperlipidemia: Secondary | ICD-10-CM | POA: Diagnosis not present

## 2018-06-22 DIAGNOSIS — I10 Essential (primary) hypertension: Secondary | ICD-10-CM | POA: Diagnosis not present

## 2018-06-22 DIAGNOSIS — Z9861 Coronary angioplasty status: Secondary | ICD-10-CM | POA: Diagnosis not present

## 2018-06-22 DIAGNOSIS — F330 Major depressive disorder, recurrent, mild: Secondary | ICD-10-CM | POA: Diagnosis not present

## 2018-06-22 DIAGNOSIS — Z6829 Body mass index (BMI) 29.0-29.9, adult: Secondary | ICD-10-CM | POA: Diagnosis not present

## 2018-06-22 DIAGNOSIS — Z0000 Encounter for general adult medical examination without abnormal findings: Secondary | ICD-10-CM | POA: Diagnosis not present

## 2018-06-22 DIAGNOSIS — I6529 Occlusion and stenosis of unspecified carotid artery: Secondary | ICD-10-CM | POA: Diagnosis not present

## 2018-07-06 DIAGNOSIS — Z961 Presence of intraocular lens: Secondary | ICD-10-CM | POA: Diagnosis not present

## 2018-07-06 DIAGNOSIS — H52203 Unspecified astigmatism, bilateral: Secondary | ICD-10-CM | POA: Diagnosis not present

## 2018-07-13 DIAGNOSIS — M47816 Spondylosis without myelopathy or radiculopathy, lumbar region: Secondary | ICD-10-CM | POA: Diagnosis not present

## 2018-07-22 DIAGNOSIS — M47816 Spondylosis without myelopathy or radiculopathy, lumbar region: Secondary | ICD-10-CM | POA: Diagnosis not present

## 2018-07-22 DIAGNOSIS — F330 Major depressive disorder, recurrent, mild: Secondary | ICD-10-CM | POA: Diagnosis not present

## 2018-07-27 DIAGNOSIS — M47816 Spondylosis without myelopathy or radiculopathy, lumbar region: Secondary | ICD-10-CM | POA: Diagnosis not present

## 2018-07-30 DIAGNOSIS — M47816 Spondylosis without myelopathy or radiculopathy, lumbar region: Secondary | ICD-10-CM | POA: Diagnosis not present

## 2018-08-02 DIAGNOSIS — M47816 Spondylosis without myelopathy or radiculopathy, lumbar region: Secondary | ICD-10-CM | POA: Diagnosis not present

## 2018-08-11 DIAGNOSIS — M47816 Spondylosis without myelopathy or radiculopathy, lumbar region: Secondary | ICD-10-CM | POA: Diagnosis not present

## 2018-08-24 DIAGNOSIS — F330 Major depressive disorder, recurrent, mild: Secondary | ICD-10-CM | POA: Diagnosis not present

## 2018-09-04 DIAGNOSIS — Z23 Encounter for immunization: Secondary | ICD-10-CM | POA: Diagnosis not present

## 2018-09-08 ENCOUNTER — Other Ambulatory Visit: Payer: Self-pay | Admitting: Nurse Practitioner

## 2018-09-08 NOTE — Telephone Encounter (Signed)
This is Dr. Jordan's pt. °

## 2018-10-19 DIAGNOSIS — S80819A Abrasion, unspecified lower leg, initial encounter: Secondary | ICD-10-CM | POA: Diagnosis not present

## 2018-10-19 DIAGNOSIS — M79605 Pain in left leg: Secondary | ICD-10-CM | POA: Diagnosis not present

## 2018-10-19 DIAGNOSIS — M79609 Pain in unspecified limb: Secondary | ICD-10-CM | POA: Diagnosis not present

## 2018-10-19 DIAGNOSIS — S82402A Unspecified fracture of shaft of left fibula, initial encounter for closed fracture: Secondary | ICD-10-CM | POA: Diagnosis not present

## 2018-10-19 DIAGNOSIS — Z6829 Body mass index (BMI) 29.0-29.9, adult: Secondary | ICD-10-CM | POA: Diagnosis not present

## 2018-10-19 DIAGNOSIS — I10 Essential (primary) hypertension: Secondary | ICD-10-CM | POA: Diagnosis not present

## 2018-10-19 DIAGNOSIS — M25572 Pain in left ankle and joints of left foot: Secondary | ICD-10-CM | POA: Diagnosis not present

## 2018-10-21 DIAGNOSIS — S82832A Other fracture of upper and lower end of left fibula, initial encounter for closed fracture: Secondary | ICD-10-CM | POA: Insufficient documentation

## 2018-10-21 DIAGNOSIS — S82302A Unspecified fracture of lower end of left tibia, initial encounter for closed fracture: Secondary | ICD-10-CM | POA: Diagnosis not present

## 2018-10-21 HISTORY — DX: Other fracture of upper and lower end of left fibula, initial encounter for closed fracture: S82.832A

## 2018-11-09 DIAGNOSIS — Z13820 Encounter for screening for osteoporosis: Secondary | ICD-10-CM | POA: Diagnosis not present

## 2018-12-06 NOTE — Progress Notes (Signed)
CARDIOLOGY OFFICE NOTE  Date:  12/09/2018    Jared Clark Date of Birth: 01-Mar-1936 Medical Record #[id number]  PCP:  Marton Redwood, MD  Cardiologist:  Peter Martinique  MD  Chief Complaint  Patient presents with  . Coronary Artery Disease    History of Present Illness: Jared Clark is a 82 y.o. male who presents for follow up post cardiac cath.  He has known CAD. In September of 2003 he underwent stenting of his first obtuse marginal vessel. He had a repeat cardiac catheterization in March 2010 after an abnormal stress Cardiolite. The cardiac catheterization showed long term patency of the prior stent and nonobstructive atherosclerotic disease elsewhere and he had normal left ventricular function. Seen as a work in on 01/25/14 because of recurring chest pain. He had LexiScan Myoview. This showed significant reversible anterolateral ischemia. On 02/10/14 the patient underwent cardiac catheterization with successful stenting of the second diagonal with a drug-eluting stent. He was seen in September 2016 complaining of atypical chest discomfort. Because of his atypical chest pain the patient underwent a myocardial perfusion scan 08/2015 which showed no ischemia and normal EF. It was felt to be a low risk study. Seen again in June 2017 with atypical chest pain and referred for cardiac cath which was unchanged. Other issues include HTN, HLD, GERD, carotid disease and obesity.   Seen in May 2018 of chest pain. Myoview study showed a small inferior and apical area of ischemia. EF lower at 47%. We recommended repeat cardiac cath. This showed nonobstructive CAD that was unchanged from prior studies. LV function was normal. EDP elevated 26-27 mm Hg. Medical management recommended. Since then he has been taking prilosec every other day and no more chest pain.[name]   He was admitted in Dec 2018 with syncope. He had been taking frequent Benadryl for rash and was also orthostatic. Cranial CT without  acute abnormality. Medications adjusted.   On follow up today he is doing OK. Still very sedentary. Not very motivated to exercise. Prior anginal pain was more in lateral chest. He has had none of that. Does note occasional funny feeling in left precordial area. Nonexertional. No real pain. Lasts 2-4 minutes.   Past Medical History:  Diagnosis Date  . Borderline diabetes    DIET CONTROLLED  . BPH (benign prostatic hypertrophy)    S/P TURP 1995  . Carotid artery disease ([place])    a. Carotid duplex in 05/2013: 05-39% RICA, <76% LICA. b. Per Patient, doppler 05/2015 showed 1-39% on the R - being folloewd by primary care.  . Cataract of right eye   . Coronary artery disease    a. 2003 s/p PCI/stenting; b. 02/2014 Abnl nuc -> Cath/PCI: LM nl, LAD nl, D2 95p (2.5x12 Promus DES), LCX <20, OM1 patent stent, RCA 50d, EF 55-65%. c. 08/2015: normal nuc.  [name] Dyslipidemia    History of dyslipidemia  . GERD (gastroesophageal reflux disease)   . Hypertension   . Mild depression ([place])   . Osteoarthritis    RIGHT ANKLE, BILATERAL WRIST, BACK  . Reflux OCCASIONAL   TAKES PRILOSEC  . Sinus bradycardia     Past Surgical History:  Procedure Laterality Date  . CARDIAC CATHETERIZATION  02-06-09   NO INTERVENTION  . CARDIAC CATHETERIZATION N/A 05/22/2016   Procedure: Left Heart Cath and Coronary Angiography;  Surgeon: Peter M Martinique, MD;  Location: [facility];  Service: Cardiovascular;  Laterality: N/A;  . CAROTID STENT    . CERVICAL  DISCECTOMY  1989   C4 - 5  . CHOLECYSTECTOMY  5027   W/ UMBILICAL HERNIA REPAIR  . CORONARY ANGIOPLASTY  2003- FIRST OBTUSE MARGIANL VESSEL    X1 CYPHER STENT  . CORONARY STENT PLACEMENT     DES to first diagonal        DR Martinique  . HERNIA REPAIR  7412   Umbilical  . INGUINAL HERNIA REPAIR  1985  . KNEE ARTHROSCOPY  11/05/2011   Procedure: ARTHROSCOPY KNEE;  Surgeon: Gearlean Alf;  Location: [facility];  Service: Orthopedics;  Laterality: Right;   RIGHT ARTHROSCOPY KNEE WITH DEBRIDEMENT, Chondroplasty  . LEFT HEART CATH AND CORONARY ANGIOGRAPHY N/A 05/01/2017   Procedure: Left Heart Cath and Coronary Angiography;  Surgeon: Martinique, Peter M, MD;  Location: [facility];  Service: Cardiovascular;  Laterality: N/A;  . LEFT HEART CATHETERIZATION WITH CORONARY ANGIOGRAM N/A 02/10/2014   Procedure: LEFT HEART CATHETERIZATION WITH CORONARY ANGIOGRAM;  Surgeon: Peter M Martinique, MD;  [facility];  Service: Cardiovascular;  Laterality: N/A;  . LUMBAR LAMINECTOMY  1973   L4-S1  . PILONIDAL CYST EXCISION  1961  . SHOULDER ARTHROSCOPY     Bilateral  . TONSILLECTOMY AND ADENOIDECTOMY  CHILD  . TRANSURETHRAL RESECTION OF PROSTATE  1995     Medications: Current Outpatient Medications  Medication Sig Dispense Refill  . aspirin 81 MG tablet Take 81 mg by mouth daily.     [name] buPROPion (WELLBUTRIN XL) 150 MG 24 hr tablet Take 150 mg by mouth daily.    [name] buPROPion (ZYBAN) 150 MG 12 hr tablet Take 150 mg by mouth 2 (two) times daily.    . calcium carbonate (TUMS - DOSED IN MG ELEMENTAL CALCIUM) 500 MG chewable tablet Chew 1 tablet by mouth daily as needed for indigestion or heartburn.    . citalopram (CELEXA) 20 MG tablet Take 20 mg by mouth daily.    . clopidogrel (PLAVIX) 75 MG tablet Take 75 mg by mouth daily.     . furosemide (LASIX) 20 MG tablet Take 1 tablet (20 mg total) by mouth daily as needed for edema. 30 tablet 3  . hydrOXYzine (ATARAX/VISTARIL) 10 MG tablet Take 1 tablet (10 mg total) by mouth 3 (three) times daily. 30 tablet 0  . ketoconazole (NIZORAL) 2 % shampoo Apply 1 application topically 2 (two) times a week. 120 mL 0  . Lidocaine, Anorectal, (RECTICARE EX) Apply 1 application topically daily as needed (ANKLE ITCHING).    [name] losartan (COZAAR) 25 MG tablet TAKE 1 TABLET BY MOUTH  DAILY 90 tablet 1  . metoprolol succinate (TOPROL-XL) 25 MG 24 hr tablet Take 12.5 mg by mouth daily.     . naproxen sodium (ANAPROX) 220 MG  tablet Take 220 mg by mouth daily.    [name] nystatin-triamcinolone ointment (MYCOLOG) Apply topically 2 (two) times daily. 30 g 0  . valACYclovir (VALTREX) 1000 MG tablet Take 1,000 mg by mouth daily as needed. Outbreak.    . zolpidem (AMBIEN) 5 MG tablet Take 2.5 mg by mouth at bedtime as needed for sleep.     [name] atorvastatin (LIPITOR) 40 MG tablet Take 1 tablet (40 mg total) by mouth daily. 90 tablet 3  . nitroGLYCERIN (NITROSTAT) 0.4 MG SL tablet Place 0.4 mg under the tongue every 5 (five) minutes as needed for chest pain (x 3 tablets daily).     No current facility-administered medications for this visit.     Allergies: Allergies  Allergen  Reactions  . Flexeril [Cyclobenzaprine] Swelling  . Methocarbamol Swelling  . Orphenadrine Citrate Swelling  . Lisinopril Cough  . Ramipril Other (See Comments)    DRY COUGH    Social History: The patient  reports that he has never smoked. He has never used smokeless tobacco. He reports current alcohol use. He reports that he does not use drugs.   Family History: The patient's family history includes Hypertension in his mother; Lung cancer in his father.   Review of Systems: Please see the history of present illness.   Otherwise, the review of systems is positive for none.   All other systems are reviewed and negative.   Physical Exam: VS:  BP 124/70   Pulse (!) 53   Ht 6\' 4"  (1.93 m)   Wt 243 lb (110.2 kg)   BMI 29.58 kg/m  .  BMI Body mass index is 29.58 kg/m.  Wt Readings from Last 3 Encounters:  12/09/18 243 lb (110.2 kg)  12/01/17 251 lb (113.9 kg)  11/10/17 248 lb (112.5 kg)    GENERAL:  Well appearing WM in NAD HEENT:  PERRL, EOMI, sclera are clear. Oropharynx is clear. NECK:  No jugular venous distention, carotid upstroke brisk and symmetric, no bruits, no thyromegaly or adenopathy LUNGS:  Clear to auscultation bilaterally CHEST:  Unremarkable HEART:  RRR,  PMI not displaced or sustained,S1 and S2 within normal limits, no  S3, no S4: no clicks, no rubs, no murmurs ABD:  Soft, nontender. BS +, no masses or bruits. No hepatomegaly, no splenomegaly EXT:  2 + pulses throughout, no edema, no cyanosis no clubbing SKIN:  Warm and dry.  No rashes NEURO:  Alert and oriented x 3. Cranial nerves II through XII intact. PSYCH:  Cognitively intact     LABORATORY DATA:  EKG:  EKG is  ordered today. NSR rate 53. LVH with QRS widening. I have personally reviewed and interpreted this study.    Lab Results  Component Value Date   WBC 12.6 (H) 12/01/2017   HGB 16.7 12/01/2017   HCT 48.3 12/01/2017   PLT 221 12/01/2017   GLUCOSE 128 (H) 12/01/2017   CHOL 137 09/15/2011   TRIG 77.0 09/15/2011   HDL 47.30 09/15/2011   LDLCALC 74 09/15/2011   ALT 22 12/01/2017   AST 31 12/01/2017   NA 136 12/01/2017   K 4.1 12/01/2017   CL 106 12/01/2017   CREATININE 0.99 12/01/2017   BUN 17 12/01/2017   CO2 19 (L) 12/01/2017   INR 1.0 04/27/2017   Dated 06/17/18: cholesterol 97, triglycerides 62, HDL 43, LDL 42. A1c 5%. CBC, CMET and TSH normal.   BNP (last 3 results) No results for input(s): BNP in the last 8760 hours.  ProBNP (last 3 results) No results for input(s): PROBNP in the last 8760 hours.   Other Studies Reviewed Today:  Labs from primary care: 05/27/16 Cholesterol 101, triglycerides 64, HDL 40, LDL 48. Chemistries, TSH, Hgb are normal.  Myoview: 04/23/17: Study Highlights     The left ventricular ejection fraction is mildly decreased (45-54%).  Nuclear stress EF: 47%.  There was no ST segment deviation noted during stress.  Defect 1: There is a small defect of moderate severity present in the mid inferior and apical inferior location.  Findings consistent with ischemia.  This is an intermediate risk study.   There is small area mild severity reversible defect in the mid and apical inferior walls (SDS =2). LVEF is mildly decreased at 47%  Cardiac cath: 05/01/17: Conclusion     Prox RCA  lesion, 20 %stenosed.  Dist RCA lesion, 50 %stenosed.  1st Mrg lesion, 0 %stenosed.  1st Diag lesion, 0 %stenosed.  Ost 1st Mrg to 1st Mrg lesion, 20 %stenosed.  Prox Cx to Mid Cx lesion, 20 %stenosed.  The left ventricular systolic function is normal.  LV end diastolic pressure is moderately elevated.  The left ventricular ejection fraction is 55-65% by visual estimate.   1. Nonobstructive CAD 2. Normal LV function 3. Elevated LVEDP  Plan: compared to last angiogram there is no significant change. Will continue medical therapy and consider other causes of chest pain.       Assessment/Plan: 1. CAD with first stent in the 1st OM back in 2003; DES to 2nd DX in March 2015. Negative Myoview from 08/2015.  Last myoview suggested an area of inferior apical ischemia. Subsequent cardiac cath showed nonobstructive disease with stable findings.   He is not having any anginal symptoms now.   2. HTN - BP is well controlled on his current therapy.   3. HLD - on high dose statin. Last LDL 42. Given advance age recommend reducing lipitor to 40 mg daily.   4. Carotid disease with  stable doppler study in July 2017  5. Obesity/deconditioning - encourage increased aerobic activity. He is working on his diet with plans to lose down to 230 lbs.    Follow up one year    Orders Placed This Encounter  Procedures  . EKG 12-Lead    Signed: Peter Martinique MD, [facility]  12/09/2018 10:27 AM  [place]

## 2018-12-09 ENCOUNTER — Ambulatory Visit (INDEPENDENT_AMBULATORY_CARE_PROVIDER_SITE_OTHER): Payer: Medicare Other | Admitting: Cardiology

## 2018-12-09 ENCOUNTER — Encounter: Payer: Self-pay | Admitting: Cardiology

## 2018-12-09 VITALS — BP 124/70 | HR 53 | Ht 76.0 in | Wt 243.0 lb

## 2018-12-09 DIAGNOSIS — I10 Essential (primary) hypertension: Secondary | ICD-10-CM | POA: Diagnosis not present

## 2018-12-09 DIAGNOSIS — I25119 Atherosclerotic heart disease of native coronary artery with unspecified angina pectoris: Secondary | ICD-10-CM

## 2018-12-09 DIAGNOSIS — E785 Hyperlipidemia, unspecified: Secondary | ICD-10-CM | POA: Diagnosis not present

## 2018-12-09 DIAGNOSIS — Z9861 Coronary angioplasty status: Secondary | ICD-10-CM

## 2018-12-09 DIAGNOSIS — I2510 Atherosclerotic heart disease of native coronary artery without angina pectoris: Secondary | ICD-10-CM

## 2018-12-09 MED ORDER — ATORVASTATIN CALCIUM 40 MG PO TABS: 40.0000 mg | ORAL_TABLET | Freq: Every day | ORAL | 3 refills | Status: DC

## 2018-12-09 NOTE — Patient Instructions (Addendum)
Decrease lipitor to 40 mg daily  Get active with walking  Follow up in one year

## 2019-01-07 DIAGNOSIS — H04122 Dry eye syndrome of left lacrimal gland: Secondary | ICD-10-CM | POA: Diagnosis not present

## 2019-03-11 ENCOUNTER — Other Ambulatory Visit: Payer: Self-pay | Admitting: Cardiology

## 2019-03-11 NOTE — Telephone Encounter (Signed)
Losartan refilled 

## 2019-05-11 ENCOUNTER — Telehealth: Payer: Self-pay | Admitting: Cardiology

## 2019-05-11 ENCOUNTER — Encounter (HOSPITAL_COMMUNITY): Payer: Self-pay | Admitting: *Deleted

## 2019-05-11 ENCOUNTER — Observation Stay (HOSPITAL_COMMUNITY)
Admission: EM | Admit: 2019-05-11 | Discharge: 2019-05-12 | Disposition: A | Discharge status: Home / Self Care | Payer: Medicare Other | Attending: Cardiovascular Disease | Admitting: Cardiovascular Disease

## 2019-05-11 ENCOUNTER — Emergency Department (HOSPITAL_COMMUNITY): Payer: Medicare Other

## 2019-05-11 ENCOUNTER — Other Ambulatory Visit: Payer: Self-pay

## 2019-05-11 ENCOUNTER — Encounter (HOSPITAL_COMMUNITY)
Admission: EM | Disposition: A | Discharge status: Home / Self Care | Payer: Self-pay | Source: Home / Self Care | Attending: Emergency Medicine

## 2019-05-11 DIAGNOSIS — K219 Gastro-esophageal reflux disease without esophagitis: Secondary | ICD-10-CM | POA: Insufficient documentation

## 2019-05-11 DIAGNOSIS — N400 Enlarged prostate without lower urinary tract symptoms: Secondary | ICD-10-CM | POA: Diagnosis not present

## 2019-05-11 DIAGNOSIS — I352 Nonrheumatic aortic (valve) stenosis with insufficiency: Secondary | ICD-10-CM | POA: Insufficient documentation

## 2019-05-11 DIAGNOSIS — I25119 Atherosclerotic heart disease of native coronary artery with unspecified angina pectoris: Secondary | ICD-10-CM | POA: Diagnosis not present

## 2019-05-11 DIAGNOSIS — Z888 Allergy status to other drugs, medicaments and biological substances status: Secondary | ICD-10-CM | POA: Diagnosis not present

## 2019-05-11 DIAGNOSIS — Z7902 Long term (current) use of antithrombotics/antiplatelets: Secondary | ICD-10-CM | POA: Diagnosis not present

## 2019-05-11 DIAGNOSIS — R079 Chest pain, unspecified: Secondary | ICD-10-CM | POA: Diagnosis not present

## 2019-05-11 DIAGNOSIS — Z1159 Encounter for screening for other viral diseases: Secondary | ICD-10-CM | POA: Insufficient documentation

## 2019-05-11 DIAGNOSIS — Z79899 Other long term (current) drug therapy: Secondary | ICD-10-CM | POA: Insufficient documentation

## 2019-05-11 DIAGNOSIS — Z8249 Family history of ischemic heart disease and other diseases of the circulatory system: Secondary | ICD-10-CM | POA: Insufficient documentation

## 2019-05-11 DIAGNOSIS — I200 Unstable angina: Secondary | ICD-10-CM | POA: Diagnosis not present

## 2019-05-11 DIAGNOSIS — E119 Type 2 diabetes mellitus without complications: Secondary | ICD-10-CM | POA: Insufficient documentation

## 2019-05-11 DIAGNOSIS — M1990 Unspecified osteoarthritis, unspecified site: Secondary | ICD-10-CM | POA: Insufficient documentation

## 2019-05-11 DIAGNOSIS — Z7982 Long term (current) use of aspirin: Secondary | ICD-10-CM | POA: Diagnosis not present

## 2019-05-11 DIAGNOSIS — E785 Hyperlipidemia, unspecified: Secondary | ICD-10-CM | POA: Insufficient documentation

## 2019-05-11 DIAGNOSIS — I2510 Atherosclerotic heart disease of native coronary artery without angina pectoris: Secondary | ICD-10-CM | POA: Diagnosis not present

## 2019-05-11 DIAGNOSIS — Z20828 Contact with and (suspected) exposure to other viral communicable diseases: Secondary | ICD-10-CM | POA: Diagnosis not present

## 2019-05-11 HISTORY — DX: Depression, unspecified: F32.A

## 2019-05-11 HISTORY — PX: LEFT HEART CATH AND CORONARY ANGIOGRAPHY: CATH118249

## 2019-05-11 LAB — CBC
HCT: 46.8 % (ref 39.0–52.0)
Hemoglobin: 15.5 g/dL (ref 13.0–17.0)
MCH: 30.6 pg (ref 26.0–34.0)
MCHC: 33.1 g/dL (ref 30.0–36.0)
MCV: 92.5 fL (ref 80.0–100.0)
Platelets: 178 10*3/uL (ref 150–400)
RBC: 5.06 MIL/uL (ref 4.22–5.81)
RDW: 13.6 % (ref 11.5–15.5)
WBC: 5.1 10*3/uL (ref 4.0–10.5)
nRBC: 0 % (ref 0.0–0.2)

## 2019-05-11 LAB — BASIC METABOLIC PANEL
Anion gap: 10 (ref 5–15)
BUN: 14 mg/dL (ref 8–23)
CO2: 24 mmol/L (ref 22–32)
Calcium: 9.7 mg/dL (ref 8.9–10.3)
Chloride: 105 mmol/L (ref 98–111)
Creatinine, Ser: 1.1 mg/dL (ref 0.61–1.24)
GFR calc Af Amer: >60 mL/min (ref >60)
GFR calc non Af Amer: >60 mL/min (ref >60)
Glucose, Bld: 98 mg/dL (ref 70–99)
Potassium: 4.5 mmol/L (ref 3.5–5.1)
Sodium: 139 mmol/L (ref 135–145)

## 2019-05-11 LAB — SARS CORONAVIRUS 2: SARS Coronavirus 2: NOT DETECTED

## 2019-05-11 LAB — TROPONIN I
Troponin I: <0.03 ng/mL (ref <0.03)
Troponin I: <0.03 ng/mL (ref <0.03)

## 2019-05-11 SURGERY — LEFT HEART CATH AND CORONARY ANGIOGRAPHY
Anesthesia: LOCAL

## 2019-05-11 MED ORDER — NITROGLYCERIN 0.4 MG SL SUBL
0.4000 mg | SUBLINGUAL_TABLET | SUBLINGUAL | Status: DC | PRN
  Administered 2019-05-11 (×2): 0.4 mg via SUBLINGUAL
  Filled 2019-05-11 (×2): qty 1

## 2019-05-11 MED ORDER — VERAPAMIL HCL 2.5 MG/ML IV SOLN
INTRAVENOUS | Status: AC
  Filled 2019-05-11: qty 2

## 2019-05-11 MED ORDER — LOSARTAN POTASSIUM 25 MG PO TABS
25.0000 mg | ORAL_TABLET | Freq: Every day | ORAL | Status: DC
  Administered 2019-05-12: 25 mg via ORAL
  Filled 2019-05-11: qty 1

## 2019-05-11 MED ORDER — CITALOPRAM HYDROBROMIDE 20 MG PO TABS
20.0000 mg | ORAL_TABLET | Freq: Every day | ORAL | Status: DC
  Administered 2019-05-12: 20 mg via ORAL
  Filled 2019-05-11: qty 1

## 2019-05-11 MED ORDER — ACETAMINOPHEN 325 MG PO TABS
650.0000 mg | ORAL_TABLET | ORAL | Status: DC | PRN
  Administered 2019-05-11: 650 mg via ORAL
  Filled 2019-05-11: qty 2

## 2019-05-11 MED ORDER — SODIUM CHLORIDE 0.9 % WEIGHT BASED INFUSION
1.0000 mL/kg/h | INTRAVENOUS | Status: AC
  Administered 2019-05-11: 1 mL/kg/h via INTRAVENOUS

## 2019-05-11 MED ORDER — LIDOCAINE HCL (PF) 1 % IJ SOLN
INTRAMUSCULAR | Status: DC | PRN
  Administered 2019-05-11: 2 mL

## 2019-05-11 MED ORDER — SODIUM CHLORIDE 0.9% FLUSH: 3.0000 mL | Freq: Two times a day (BID) | INTRAVENOUS | Status: DC

## 2019-05-11 MED ORDER — VERAPAMIL HCL 2.5 MG/ML IV SOLN
INTRAVENOUS | Status: DC | PRN
  Administered 2019-05-11: 10 mL via INTRA_ARTERIAL

## 2019-05-11 MED ORDER — HEPARIN (PORCINE) IN NACL 1000-0.9 UT/500ML-% IV SOLN
INTRAVENOUS | Status: DC | PRN
  Administered 2019-05-11 (×2): 500 mL

## 2019-05-11 MED ORDER — IOHEXOL 350 MG/ML SOLN
INTRAVENOUS | Status: DC | PRN
  Administered 2019-05-11: 90 mL via INTRACARDIAC

## 2019-05-11 MED ORDER — HEPARIN SODIUM (PORCINE) 1000 UNIT/ML IJ SOLN
INTRAMUSCULAR | Status: DC | PRN
  Administered 2019-05-11: 5000 [IU] via INTRAVENOUS

## 2019-05-11 MED ORDER — ZOLPIDEM TARTRATE 5 MG PO TABS: 2.5000 mg | ORAL_TABLET | Freq: Every evening | ORAL | Status: DC | PRN

## 2019-05-11 MED ORDER — CLOPIDOGREL BISULFATE 75 MG PO TABS
75.0000 mg | ORAL_TABLET | Freq: Every day | ORAL | Status: DC
  Administered 2019-05-11 – 2019-05-12 (×2): 75 mg via ORAL
  Filled 2019-05-11 (×2): qty 1

## 2019-05-11 MED ORDER — ALUM & MAG HYDROXIDE-SIMETH 200-200-20 MG/5ML PO SUSP
30.0000 mL | Freq: Once | ORAL | Status: AC
  Administered 2019-05-11: 13:00:00 30 mL via ORAL
  Filled 2019-05-11: qty 30

## 2019-05-11 MED ORDER — SODIUM CHLORIDE 0.9% FLUSH: 3.0000 mL | INTRAVENOUS | Status: DC | PRN

## 2019-05-11 MED ORDER — SODIUM CHLORIDE 0.9% FLUSH: 3.0000 mL | Freq: Once | INTRAVENOUS | Status: DC

## 2019-05-11 MED ORDER — HEPARIN SODIUM (PORCINE) 5000 UNIT/ML IJ SOLN
5000.0000 [IU] | Freq: Three times a day (TID) | INTRAMUSCULAR | Status: DC
  Administered 2019-05-12: 5000 [IU] via SUBCUTANEOUS
  Filled 2019-05-11: qty 1

## 2019-05-11 MED ORDER — ATORVASTATIN CALCIUM 40 MG PO TABS
40.0000 mg | ORAL_TABLET | Freq: Every day | ORAL | Status: DC
  Administered 2019-05-12: 40 mg via ORAL
  Filled 2019-05-11: qty 1

## 2019-05-11 MED ORDER — MIDAZOLAM HCL 2 MG/2ML IJ SOLN
INTRAMUSCULAR | Status: AC
  Filled 2019-05-11: qty 2

## 2019-05-11 MED ORDER — LIDOCAINE VISCOUS HCL 2 % MT SOLN
15.0000 mL | Freq: Once | OROMUCOSAL | Status: DC
  Filled 2019-05-11: qty 15

## 2019-05-11 MED ORDER — FENTANYL CITRATE (PF) 100 MCG/2ML IJ SOLN
INTRAMUSCULAR | Status: DC | PRN
  Administered 2019-05-11: 25 ug via INTRAVENOUS

## 2019-05-11 MED ORDER — BUPROPION HCL ER (XL) 150 MG PO TB24
150.0000 mg | ORAL_TABLET | Freq: Every day | ORAL | Status: DC
  Administered 2019-05-12: 150 mg via ORAL
  Filled 2019-05-11: qty 1

## 2019-05-11 MED ORDER — HEPARIN (PORCINE) IN NACL 1000-0.9 UT/500ML-% IV SOLN
INTRAVENOUS | Status: AC
  Filled 2019-05-11: qty 1000

## 2019-05-11 MED ORDER — LIDOCAINE HCL (PF) 1 % IJ SOLN
INTRAMUSCULAR | Status: AC
  Filled 2019-05-11: qty 30

## 2019-05-11 MED ORDER — MIDAZOLAM HCL 2 MG/2ML IJ SOLN
INTRAMUSCULAR | Status: DC | PRN
  Administered 2019-05-11: 1 mg via INTRAVENOUS

## 2019-05-11 MED ORDER — MORPHINE SULFATE (PF) 4 MG/ML IV SOLN
4.0000 mg | Freq: Once | INTRAVENOUS | Status: AC
  Administered 2019-05-11: 4 mg via INTRAVENOUS
  Filled 2019-05-11: qty 1

## 2019-05-11 MED ORDER — ASPIRIN 81 MG PO CHEW
324.0000 mg | CHEWABLE_TABLET | Freq: Once | ORAL | Status: AC
  Administered 2019-05-11: 324 mg via ORAL
  Filled 2019-05-11: qty 4

## 2019-05-11 MED ORDER — ALUM & MAG HYDROXIDE-SIMETH 200-200-20 MG/5ML PO SUSP
30.0000 mL | Freq: Once | ORAL | Status: AC
  Administered 2019-05-11: 30 mL via ORAL
  Filled 2019-05-11: qty 30

## 2019-05-11 MED ORDER — METOPROLOL SUCCINATE ER 25 MG PO TB24
12.5000 mg | ORAL_TABLET | Freq: Every day | ORAL | Status: DC
  Administered 2019-05-12: 12.5 mg via ORAL
  Filled 2019-05-11: qty 1

## 2019-05-11 MED ORDER — FENTANYL CITRATE (PF) 100 MCG/2ML IJ SOLN
INTRAMUSCULAR | Status: AC
  Filled 2019-05-11: qty 2

## 2019-05-11 MED ORDER — ONDANSETRON HCL 4 MG/2ML IJ SOLN: 4.0000 mg | Freq: Four times a day (QID) | INTRAMUSCULAR | Status: DC | PRN

## 2019-05-11 MED ORDER — SODIUM CHLORIDE 0.9 % IV SOLN: 250.0000 mL | INTRAVENOUS | Status: DC | PRN

## 2019-05-11 SURGICAL SUPPLY — 11 items
CATH 5FR JL3.5 JR4 ANG PIG MP (CATHETERS) ×2 IMPLANT
DEVICE RAD COMP TR BAND LRG (VASCULAR PRODUCTS) ×2 IMPLANT
GLIDESHEATH SLEND SS 6F .021 (SHEATH) ×2 IMPLANT
GUIDEWIRE INQWIRE 1.5J.035X260 (WIRE) ×1 IMPLANT
INQWIRE 1.5J .035X260CM (WIRE) ×2
KIT HEART LEFT (KITS) ×2 IMPLANT
PACK CARDIAC CATHETERIZATION (CUSTOM PROCEDURE TRAY) ×2 IMPLANT
SYR MEDRAD MARK 7 150ML (SYRINGE) ×2 IMPLANT
TRANSDUCER W/STOPCOCK (MISCELLANEOUS) ×2 IMPLANT
TUBING CIL FLEX 10 FLL-RA (TUBING) ×2 IMPLANT
TUBING CONTRAST HIGH PRESS 20 (MISCELLANEOUS) ×2 IMPLANT

## 2019-05-11 NOTE — ED Notes (Signed)
Pt navigator updated this pt's wife.

## 2019-05-11 NOTE — ED Notes (Signed)
Reported CP status to Dr Melina Copa. He is going to see this pt.

## 2019-05-11 NOTE — ED Notes (Signed)
ED TO INPATIENT HANDOFF REPORT  ED Nurse Name and Phone #: Darci Current Name/Age/Gender Jared Clark 83 y.o. male Room/Bed: 020C/020C  Code Status   Code Status: Prior  Home/SNF/Other Home Patient oriented to: self, place, time and situation Is this baseline? Yes   Triage Complete: Triage complete  Chief Complaint Chest Pain  Triage Note To ED for eval of left side cp that woke pt this am around 0100. Pt took 3 nitro a few min apart with relief after the 3rd. Pt was able to go back to sleep until 0700 when he woke again with pain. Hx of stents. Never had pain like this. States pain increases with walking.    Allergies Allergies  Allergen Reactions  . Flexeril [Cyclobenzaprine] Swelling  . Methocarbamol Swelling  . Orphenadrine Citrate Swelling  . Lisinopril Cough  . Ramipril Other (See Comments)    DRY COUGH    Level of Care/Admitting Diagnosis ED Disposition    ED Disposition Condition [facility] Area: [place] [[id number]]  Level of Care: Telemetry Cardiac [103]  Covid Evaluation: Confirmed COVID Negative  Diagnosis: Chest pain [094709]  Admitting Physician: Burnell Blanks [[id number]]  Attending Physician: Lauree Chandler D [[id number]]  PT Class (Do Not Modify): Observation [104]  PT Acc Code (Do Not Modify): Observation [10022]       B Medical/Surgery History Past Medical History:  Diagnosis Date  . Borderline diabetes    DIET CONTROLLED  . BPH (benign prostatic hypertrophy)    S/P TURP 1995  . Carotid artery disease ([place])    a. Carotid duplex in 05/2013: 62-83% RICA, <66% LICA. b. Per Patient, doppler 05/2015 showed 1-39% on the R - being folloewd by primary care.  . Cataract of right eye   . Coronary artery disease    a. 2003 s/p PCI/stenting; b. 02/2014 Abnl nuc -> Cath/PCI: LM nl, LAD nl, D2 95p (2.5x12 Promus DES), LCX <20, OM1 patent stent, RCA 50d, EF 55-65%. c. 08/2015: normal nuc.  [name] Dyslipidemia    History of dyslipidemia  . GERD (gastroesophageal reflux disease)   . Hypertension   . Mild depression ([place])   . Osteoarthritis    RIGHT ANKLE, BILATERAL WRIST, BACK  . Reflux OCCASIONAL   TAKES PRILOSEC  . Sinus bradycardia    Past Surgical History:  Procedure Laterality Date  . CARDIAC CATHETERIZATION  02-06-09   NO INTERVENTION  . CARDIAC CATHETERIZATION N/A 05/22/2016   Procedure: Left Heart Cath and Coronary Angiography;  Surgeon: Peter M Martinique, MD;  Location: [facility];  Service: Cardiovascular;  Laterality: N/A;  . CAROTID STENT    . CERVICAL DISCECTOMY  1989   C4 - 5  . CHOLECYSTECTOMY  2947   W/ UMBILICAL HERNIA REPAIR  . CORONARY ANGIOPLASTY  2003- FIRST OBTUSE MARGIANL VESSEL    X1 CYPHER STENT  . CORONARY STENT PLACEMENT     DES to first diagonal        DR Martinique  . HERNIA REPAIR  [id number]   Umbilical  . INGUINAL HERNIA REPAIR  1985  . KNEE ARTHROSCOPY  11/05/2011   Procedure: ARTHROSCOPY KNEE;  Surgeon: Gearlean Alf;  Location: [facility];  Service: Orthopedics;  Laterality: Right;  RIGHT ARTHROSCOPY KNEE WITH DEBRIDEMENT, Chondroplasty  . LEFT HEART CATH AND CORONARY ANGIOGRAPHY N/A 05/01/2017   Procedure: Left Heart Cath and Coronary Angiography;  Surgeon: Martinique, Peter M, MD;  Location: [facility];  Service: Cardiovascular;  Laterality: N/A;  . LEFT HEART CATHETERIZATION WITH CORONARY ANGIOGRAM N/A 02/10/2014   Procedure: LEFT HEART CATHETERIZATION WITH CORONARY ANGIOGRAM;  Surgeon: Peter M Martinique, MD;  Location: [facility];  Service: Cardiovascular;  Laterality: N/A;  . LUMBAR LAMINECTOMY  1973   L4-S1  . PILONIDAL CYST EXCISION  1961  . SHOULDER ARTHROSCOPY     Bilateral  . TONSILLECTOMY AND ADENOIDECTOMY  CHILD  . TRANSURETHRAL RESECTION OF PROSTATE  1995     A IV Location/Drains/Wounds Patient Lines/Drains/Airways Status   Active Line/Drains/Airways    Name:   Placement date:   Placement time:   Site:   Days:    Peripheral IV 05/11/19 Left Antecubital   05/11/19    1135    Antecubital   less than 1          Intake/Output Last 24 hours No intake or output data in the 24 hours ending 05/11/19 1535  Labs/Imaging Results for orders placed or performed during the hospital encounter of 05/11/19 (from the past 48 hour(s))  Basic metabolic panel     Status: None   Collection Time: 05/11/19 10:32 AM  Result Value Ref Range   Sodium 139 135 - 145 mmol/L   Potassium 4.5 3.5 - 5.1 mmol/L   Chloride 105 98 - 111 mmol/L   CO2 24 22 - 32 mmol/L   Glucose, Bld 98 70 - 99 mg/dL   BUN 14 8 - 23 mg/dL   Creatinine, Ser 1.10 0.61 - 1.24 mg/dL   Calcium 9.7 8.9 - 10.3 mg/dL   GFR calc non Af Amer >60 >60 mL/min   GFR calc Af Amer >60 >60 mL/min   Anion gap 10 5 - 15    Comment: Performed at [facility], [place] [address], [place], [place] [address]  CBC     Status: None   Collection Time: 05/11/19 10:32 AM  Result Value Ref Range   WBC 5.1 4.0 - 10.5 K/uL   RBC 5.06 4.22 - 5.81 MIL/uL   Hemoglobin 15.5 13.0 - 17.0 g/dL   HCT 46.8 39.0 - 52.0 %   MCV 92.5 80.0 - 100.0 fL   MCH 30.6 26.0 - 34.0 pg   MCHC 33.1 30.0 - 36.0 g/dL   RDW 13.6 11.5 - 15.5 %   Platelets 178 150 - 400 K/uL   nRBC 0.0 0.0 - 0.2 %    Comment: Performed at [facility], [place] [address] [place], [place] [address]  Troponin I - ONCE - STAT     Status: None   Collection Time: 05/11/19 10:32 AM  Result Value Ref Range   Troponin I <0.03 <0.03 ng/mL    Comment: Performed at [facility] [address] [place], [place] [address]  SARS Coronavirus 2     Status: None   Collection Time: 05/11/19 11:59 AM  Result Value Ref Range   SARS Coronavirus 2 NOT DETECTED NOT DETECTED    Comment: (NOTE) SARS-CoV-2 target nucleic acids are NOT DETECTED. The SARS-CoV-2 RNA is generally detectable in upper and lower respiratory specimens during the acute phase of infection.  Negative  results do not preclude SARS-CoV-2  infection, do not rule out co-infections with other pathogens, and should not be used as the sole basis for treatment or other patient management decisions.  Negative results must be combined with clinical observations, patient history, and epidemiological information. The expected result is Not Detected. Fact Sheet for Patients: http://www.biofiredefense.com/wp-content/uploads/2020/03/BIOFIRE-COVID -19-patients.pdf  Fact Sheet for Healthcare Providers: http://www.biofiredefense.com/wp-content/uploads/2020/03/BIOFIRE-COVID -19-hcp.pdf This test is not yet approved or cleared by the [place] and  has been authorized for detection and/or diagnosis of SARS-CoV-2 by FDA under an Emergency Use Authorization (EUA).  This EUA will remain in effec t (meaning this test can be used) for the duration of  the COVID-19 declaration under Section 564(b)(1) of the Act, 21 U.S.C. section 360bbb-3(b)(1), unless the authorization is terminated or revoked sooner. Performed at [facility], [place] [address], [place], [place] [address]   Troponin I - ONCE - STAT     Status: None   Collection Time: 05/11/19  1:36 PM  Result Value Ref Range   Troponin I <0.03 <0.03 ng/mL    Comment: Performed at [facility], [place] [address] [place], [place] [address]   Dg Chest 2 View  Result Date: 05/11/2019 CLINICAL DATA:  Chest pain EXAM: CHEST - 2 VIEW COMPARISON:  04/27/2017 FINDINGS: Eventration of the right diaphragm, stable. Normal heart size and mediastinal contours. Mild atelectasis at the bases. There is no edema, consolidation, effusion, or pneumothorax. IMPRESSION: No acute disease. Electronically Signed   By: Monte Fantasia [name]   On: 05/11/2019 11:04    Pending Labs Unresulted Labs (From admission, onward)    Start     Ordered   Signed and Held  CBC  (heparin)  Once,   R    Comments:  Baseline for heparin therapy IF NOT ALREADY DRAWN.  Notify MD if PLT < 100 K.    Signed and Held    Signed and Held  Creatinine, serum  (heparin)  Once,   R    Comments:  Baseline for heparin therapy IF NOT ALREADY DRAWN.    Signed and Held   Signed and Held  Basic metabolic panel  Tomorrow morning,   R     Signed and Held   Signed and Held  Lipid panel  Tomorrow morning,   R     Signed and Held          Vitals/Pain Today's Vitals   05/11/19 1400 05/11/19 1405 05/11/19 1500 05/11/19 1508  BP: (!) 149/84  127/75   Pulse: (!) 55  (!) 50   Resp: 16  20   SpO2: 97%  92%   PainSc:  0-No pain  0-No pain    Isolation Precautions No active isolations  Medications Medications  sodium chloride flush (NS) 0.9 % injection 3 mL (has no administration in time range)  nitroGLYCERIN (NITROSTAT) SL tablet 0.4 mg (0.4 mg Sublingual Given 05/11/19 1137)  alum & mag hydroxide-simeth (MAALOX/MYLANTA) 200-200-20 MG/5ML suspension 30 mL (30 mLs Oral Given 05/11/19 1309)    And  lidocaine (XYLOCAINE) 2 % viscous mouth solution 15 mL (15 mLs Oral Not Given 05/11/19 1309)  aspirin chewable tablet 324 mg (324 mg Oral Given 05/11/19 1124)  morphine 4 MG/ML injection 4 mg (4 mg Intravenous Given 05/11/19 1137)  alum & mag hydroxide-simeth (MAALOX/MYLANTA) 200-200-20 MG/5ML suspension 30 mL (30 mLs Oral Given 05/11/19 1324)    Mobility walks Low fall risk   Focused Assessments Cardiac Assessment Handoff:  Cardiac Rhythm: Sinus bradycardia Lab Results  Component Value Date   TROPONINI <0.03 05/11/2019   No results found for: DDIMER Does the Patient currently have chest pain? No     R Recommendations: See Admitting Provider Note  Report given to:   Additional Notes:

## 2019-05-11 NOTE — ED Provider Notes (Signed)
[facility] EMERGENCY DEPARTMENT Provider Note   CSN: [id number] Arrival date & time: 05/11/19  1015    History   Chief Complaint Chief Complaint  Patient presents with  . Chest Pain    HPI Jared Clark is a 83 y.o. male.  He has a prior history of cardiac disease and has had stents x2.  He has not taken nitroglycerin in 3 or 4 years.  He said around 1 AM he woke up with left-sided chest pain that is been on and off since then.  It waxes and wanes in intensity up to a  8 out of 10.  He tried 3 nitro with some relief.  He talked to cardiology this morning and they recommended he come here for evaluation.  This pain is similar to what got him his stents in the past.  He denies any trauma fever cough shortness of breath or other symptoms.  HPI: A 83 year old patient with a history of hypertension and hypercholesterolemia presents for evaluation of chest pain. Initial onset of pain was more than 6 hours ago. The patient's chest pain is well-localized and is not worse with exertion. The patient's chest pain is middle- or left-sided, is not described as heaviness/pressure/tightness, is not sharp and does not radiate to the arms/jaw/neck. The patient does not complain of nausea and denies diaphoresis. The patient has no history of stroke, has no history of peripheral artery disease, has not smoked in the past 90 days, denies any history of treated diabetes, has no relevant family history of coronary artery disease (first degree relative at less than age 76) and does not have an elevated BMI (>=30).   The history is provided by the patient.  Chest Pain  Pain location:  L chest Pain quality: aching   Pain radiates to:  Does not radiate Pain severity:  Moderate Onset quality:  Sudden Timing:  Constant Progression:  Waxing and waning Chronicity:  Recurrent Worsened by:  Certain positions Ineffective treatments:  Nitroglycerin Associated symptoms: no abdominal pain, no back  pain, no cough, no fever, no headache, no nausea, no shortness of breath and no vomiting     Past Medical History:  Diagnosis Date  . Borderline diabetes    DIET CONTROLLED  . BPH (benign prostatic hypertrophy)    S/P TURP 1995  . Carotid artery disease ([place])    a. Carotid duplex in 05/2013: 59-56% RICA, <38% LICA. b. Per Patient, doppler 05/2015 showed 1-39% on the R - being folloewd by primary care.  . Cataract of right eye   . Coronary artery disease    a. 2003 s/p PCI/stenting; b. 02/2014 Abnl nuc -> Cath/PCI: LM nl, LAD nl, D2 95p (2.5x12 Promus DES), LCX <20, OM1 patent stent, RCA 50d, EF 55-65%. c. 08/2015: normal nuc.  [name] Dyslipidemia    History of dyslipidemia  . GERD (gastroesophageal reflux disease)   . Hypertension   . Mild depression ([place])   . Osteoarthritis    RIGHT ANKLE, BILATERAL WRIST, BACK  . Reflux OCCASIONAL   TAKES PRILOSEC  . Sinus bradycardia     Patient Active Problem List   Diagnosis Date Noted  . Syncope 12/01/2017  . Hives 12/01/2017  . Chest pain with moderate risk for cardiac etiology 05/22/2016  . CAD S/P percutaneous coronary angioplasty 10/02/2015  . Essential hypertension 10/02/2015  . Sinus bradycardia 10/02/2015  . Bilateral carotid artery disease ([place]) 08/29/2015  . Abnormal nuclear stress test 02/07/2014  . Benign hypertensive heart disease  without heart failure 08/22/2011  . Ischemic heart disease 03/19/2011  . Dyslipidemia 03/19/2011  . BPH (benign prostatic hyperplasia) 03/19/2011  . Malaise and fatigue 03/19/2011    Past Surgical History:  Procedure Laterality Date  . CARDIAC CATHETERIZATION  02-06-09   NO INTERVENTION  . CARDIAC CATHETERIZATION N/A 05/22/2016   Procedure: Left Heart Cath and Coronary Angiography;  Surgeon: Peter M Martinique, MD;  Location: [facility];  Service: Cardiovascular;  Laterality: N/A;  . CAROTID STENT    . CERVICAL DISCECTOMY  1989   C4 - 5  . CHOLECYSTECTOMY  0865   W/ UMBILICAL HERNIA REPAIR   . CORONARY ANGIOPLASTY  2003- FIRST OBTUSE MARGIANL VESSEL    X1 CYPHER STENT  . CORONARY STENT PLACEMENT     DES to first diagonal        DR Martinique  . HERNIA REPAIR  7846   Umbilical  . INGUINAL HERNIA REPAIR  1985  . KNEE ARTHROSCOPY  11/05/2011   Procedure: ARTHROSCOPY KNEE;  Surgeon: Gearlean Alf;  Location: [facility];  Service: Orthopedics;  Laterality: Right;  RIGHT ARTHROSCOPY KNEE WITH DEBRIDEMENT, Chondroplasty  . LEFT HEART CATH AND CORONARY ANGIOGRAPHY N/A 05/01/2017   Procedure: Left Heart Cath and Coronary Angiography;  Surgeon: Martinique, Peter M, MD;  Location: [facility];  Service: Cardiovascular;  Laterality: N/A;  . LEFT HEART CATHETERIZATION WITH CORONARY ANGIOGRAM N/A 02/10/2014   Procedure: LEFT HEART CATHETERIZATION WITH CORONARY ANGIOGRAM;  Surgeon: Peter M Martinique, MD;  Location: [facility] CATH LAB;  Service: Cardiovascular;  Laterality: N/A;  . LUMBAR LAMINECTOMY  1973   L4-S1  . PILONIDAL CYST EXCISION  1961  . SHOULDER ARTHROSCOPY     Bilateral  . TONSILLECTOMY AND ADENOIDECTOMY  CHILD  . TRANSURETHRAL RESECTION OF PROSTATE  1995        Home Medications    Prior to Admission medications   Medication Sig Start Date End Date Taking? Authorizing Provider  aspirin 81 MG tablet Take 81 mg by mouth daily.     [provider]  atorvastatin (LIPITOR) 40 MG tablet Take 1 tablet (40 mg total) by mouth daily. 12/09/18 12/04/19  Martinique, Peter M, MD  buPROPion (WELLBUTRIN XL) 150 MG 24 hr tablet Take 150 mg by mouth daily.    [provider]  buPROPion (ZYBAN) 150 MG 12 hr tablet Take 150 mg by mouth 2 (two) times daily.    [provider]  calcium carbonate (TUMS - DOSED IN MG ELEMENTAL CALCIUM) 500 MG chewable tablet Chew 1 tablet by mouth daily as needed for indigestion or heartburn.    [provider]  citalopram (CELEXA) 20 MG tablet Take 20 mg by mouth daily.    [provider]  clopidogrel (PLAVIX) 75 MG  tablet Take 75 mg by mouth daily.     [provider]  furosemide (LASIX) 20 MG tablet Take 1 tablet (20 mg total) by mouth daily as needed for edema. 05/21/17   Martinique, Peter M, MD  hydrOXYzine (ATARAX/VISTARIL) 10 MG tablet Take 1 tablet (10 mg total) by mouth 3 (three) times daily. 12/02/17   Alma Friendly, MD  ketoconazole (NIZORAL) 2 % shampoo Apply 1 application topically 2 (two) times a week. 12/03/17   Alma Friendly, MD  Lidocaine, Anorectal, (RECTICARE EX) Apply 1 application topically daily as needed (ANKLE ITCHING).    [provider]  losartan (COZAAR) 25 MG tablet TAKE 1 TABLET BY MOUTH  DAILY 03/11/19  Martinique, Peter M, MD  metoprolol succinate (TOPROL-XL) 25 MG 24 hr tablet Take 12.5 mg by mouth daily.  09/09/12   Darlin Coco, MD  naproxen sodium (ANAPROX) 220 MG tablet Take 220 mg by mouth daily.    [provider]  nitroGLYCERIN (NITROSTAT) 0.4 MG SL tablet Place 0.4 mg under the tongue every 5 (five) minutes as needed for chest pain (x 3 tablets daily).    [provider]  nystatin-triamcinolone ointment (MYCOLOG) Apply topically 2 (two) times daily. 12/02/17   Alma Friendly, MD  valACYclovir (VALTREX) 1000 MG tablet Take 1,000 mg by mouth daily as needed. Outbreak.    [provider]  zolpidem (AMBIEN) 5 MG tablet Take 2.5 mg by mouth at bedtime as needed for sleep.  08/16/15   [provider]    Family History Family History  Problem Relation Age of Onset  . Hypertension Mother   . Lung cancer Father   . Heart attack Neg Hx   . Stroke Neg Hx     Social History Social History   Tobacco Use  . Smoking status: Never Smoker  . Smokeless tobacco: Never Used  Substance Use Topics  . Alcohol use: Yes    Comment: RARE  . Drug use: No     Allergies   Flexeril [cyclobenzaprine]; Methocarbamol; Orphenadrine citrate; Lisinopril; and Ramipril   Review of Systems Review of Systems   Constitutional: Negative for fever.  HENT: Negative for sore throat.   Eyes: Negative for visual disturbance.  Respiratory: Negative for cough and shortness of breath.   Cardiovascular: Positive for chest pain.  Gastrointestinal: Negative for abdominal pain, nausea and vomiting.  Genitourinary: Negative for dysuria.  Musculoskeletal: Negative for back pain.  Skin: Negative for rash.  Neurological: Negative for headaches.     Physical Exam Updated Vital Signs BP 110/68 (BP Location: Left Arm)   Pulse (!) 51   Temp (!) 97.5 F (36.4 C) (Oral)   Resp 20   Wt 108.9 kg   SpO2 96%   BMI 29.21 kg/m   Physical Exam Vitals signs and nursing note reviewed.  Constitutional:      Appearance: He is well-developed.  HENT:     Head: Normocephalic and atraumatic.  Eyes:     Conjunctiva/sclera: Conjunctivae normal.  Neck:     Musculoskeletal: Neck supple.  Cardiovascular:     Rate and Rhythm: Normal rate and regular rhythm.     Heart sounds: Normal heart sounds. No murmur.  Pulmonary:     Effort: Pulmonary effort is normal. No respiratory distress.     Breath sounds: Normal breath sounds.  Abdominal:     Palpations: Abdomen is soft.     Tenderness: There is no abdominal tenderness.  Musculoskeletal: Normal range of motion.     Right lower leg: He exhibits no tenderness.     Left lower leg: He exhibits no tenderness.  Skin:    General: Skin is warm and dry.  Neurological:     General: No focal deficit present.     Mental Status: He is alert.      ED Treatments / Results  Labs (all labs ordered are listed, but only abnormal results are displayed) Labs Reviewed  SARS CORONAVIRUS 2  BASIC METABOLIC PANEL  CBC  TROPONIN I  TROPONIN I  BASIC METABOLIC PANEL  LIPID PANEL    EKG EKG Interpretation  Date/Time:  Wednesday May 11 2019 10:18:01 EDT Ventricular Rate:  58 PR Interval:  178 QRS  Duration: 144 QT Interval:  444 QTC Calculation: 435 R Axis:   -22 Text  Interpretation:  Sinus bradycardia with Fusion complexes Left ventricular hypertrophy with QRS widening and repolarization abnormality Cannot rule out Septal infarct , age undetermined Abnormal ECG similar to 12/18 Confirmed by Aletta Edouard [phone]) on 05/11/2019 10:53:47 AM   Radiology Dg Chest 2 View  Result Date: 05/11/2019 CLINICAL DATA:  Chest pain EXAM: CHEST - 2 VIEW COMPARISON:  04/27/2017 FINDINGS: Eventration of the right diaphragm, stable. Normal heart size and mediastinal contours. Mild atelectasis at the bases. There is no edema, consolidation, effusion, or pneumothorax. IMPRESSION: No acute disease. Electronically Signed   By: Monte Fantasia [name]   On: 05/11/2019 11:04    Procedures Procedures (including critical care time)  Medications Ordered in ED Medications  sodium chloride flush (NS) 0.9 % injection 3 mL (has no administration in time range)  aspirin chewable tablet 324 mg (has no administration in time range)  nitroGLYCERIN (NITROSTAT) SL tablet 0.4 mg (has no administration in time range)  morphine 4 MG/ML injection 4 mg (has no administration in time range)     Initial Impression / Assessment and Plan / ED Course  I have reviewed the triage vital signs and the nursing notes.  Pertinent labs & imaging results that were available during my care of the patient were reviewed by me and considered in my medical decision making (see chart for details).  Clinical Course as of May 10 1822  Wed May 10, 1156  5826 83 year old male with known coronary disease and waxing and waning chest pain since 1 AM.  EKG nonspecific.  Troponin pending.  It was partially nitro responsive so we will try some more nitro here and ultimately will be consulting cardiology for evaluation.  Patient states he follows with Dr. Martinique.   [MB]  8841 Differential diagnosis includes ACS, unstable angina, pneumothorax, pneumonia, PE, musculoskeletal, GERD   [MB]  1424 Delta troponin unchanged.  Still  waiting on cardiology recommendations.   [MB]    Clinical Course User Index [MB] Hayden Rasmussen, MD   Jared Clark was evaluated in Emergency Department on 05/11/2019 for the symptoms described in the history of present illness. He was evaluated in the context of the global COVID-19 pandemic, which necessitated consideration that the patient might be at risk for infection with the SARS-CoV-2 virus that causes COVID-19. Institutional protocols and algorithms that pertain to the evaluation of patients at risk for COVID-19 are in a state of rapid change based on information released by regulatory bodies including the CDC and federal and state organizations. These policies and algorithms were followed during the patient's care in the ED.   HEAR Score: 5   Final Clinical Impressions(s) / ED Diagnoses   Final diagnoses:  Unstable angina [facility])    ED Discharge Orders    None       Hayden Rasmussen, MD 05/11/19 1824

## 2019-05-11 NOTE — ED Notes (Signed)
Pt has spoken to his wife and updated her.

## 2019-05-11 NOTE — ED Notes (Signed)
Nurse navigator spoke with wife patient transported to the [place] lab.

## 2019-05-11 NOTE — Telephone Encounter (Signed)
Discussed with dr Harrell Gave, patient advised to go to ER.

## 2019-05-11 NOTE — Telephone Encounter (Signed)
Pt c/o of Chest Pain: STAT if CP now or developed within 24 hours  1. Are you having CP right now? yes  2. Are you experiencing any other symptoms (ex. SOB, nausea, vomiting, sweating)? no   3. How long have you been experiencing CP? Woke the patient up 1 am, lasted for ~1 hr, then went away. CP started again ~30 min ago, but has not needed to take any nitro  4. Is your CP continuous or coming and going? Come and go   5. Have you taken Nitroglycerin? Yes. 3 between 1-2am  Patient wants to come in and get an EKG ?

## 2019-05-11 NOTE — Interval H&P Note (Signed)
History and Physical Interval Note:  05/11/2019 3:54 PM  Jared Clark  has presented today for surgery, with the diagnosis of n stemi.  The various methods of treatment have been discussed with the patient and family. After consideration of risks, benefits and other options for treatment, the patient has consented to  Procedure(s): LEFT HEART CATH AND CORONARY ANGIOGRAPHY (N/A) as a surgical intervention.  The patient's history has been reviewed, patient examined, no change in status, stable for surgery.  I have reviewed the patient's chart and labs.  Questions were answered to the patient's satisfaction.   Cath Lab Visit (complete for each Cath Lab visit)  Clinical Evaluation Leading to the Procedure:   ACS: Yes.    Non-ACS:    Anginal Classification: CCS III  Anti-ischemic medical therapy: Minimal Therapy (1 class of medications)  Non-Invasive Test Results: No non-invasive testing performed  Prior CABG: No previous CABG        [name] [facility] 05/11/2019 3:55 PM

## 2019-05-11 NOTE — H&P (Addendum)
Cardiology Admission History and Physical:   Patient ID: Jared Clark MRN: [id number]; DOB: [birth year]   Admission date: 05/11/2019  Primary Care Provider: Marton [facility], MD Primary Cardiologist: No primary care provider on file.  Primary Electrophysiologist:  None   Chief Complaint:  Chest pain   Patient Profile:   Jared Clark is a 83 y.o. male with PMH of CAD s/p OM '03, HTN, HL, GERD, BPH, carotid artery disease, and DM who presented with chest pain.  History of Present Illness:   Jared Clark is an 83 yo male with PMH noted above. He is followed by Dr. Martinique as an outpatient. Underwent cardiac cath back in 2003 with stent to the 1st OM. Had an abnormal myoview in 2010 with repeat cardiac cath that showed patency of previous stent with mild nonobstructive disease. Normal EF noted. Underwent repeat lexiscan in feburary of 2015 that showed reversible ischemia anterolaterally. Was referred for cardiac cath with successful stenting of a 2nd diag. Seen again in 9/19 after complaining of atypical chest pain with myoview showing no ischemia. Felt to be a low risk study. In June 2017 presented with atypical chest pain and referred for cardiac cath, which was unchanged.   May 2018 had recurrent chest pain, and myoview showed small inferior and apical area of ischemia. Cath was recommended, showing nonobstructive CAD unchanged from prior studies. LV function was normal. He was started on prilosec with improvement in symptoms.   He was last seen in the office on 12/09/18 and reported doing well. Did have occasional precordial chest pain, nonexertional. Very brief when present. Medications were continued the same with the exception of reducing his lipitor to 40mg  daily.   Reports this morning he awoke with left sided chest pain around 1am. States it felt like a pressure right over his heart. He took a total of 3 SL nitro with improvement in symptoms by the 3rd nitro. States he was able to fall  back asleep until around 7am. After getting up this morning he continued to have intermittent episodes of chest pain with one episode of radiation towards his left shoulder. He became concerned with these ongoing symptoms and called the office. Office advised he come to the ED for further evaluation.   In the ED his labs showed stable electrolytes, trop neg x1, Hgb 15.5.  CXR was negative. EKG showed SR with no acute ST/TW changes. He was having intermittent episodes of chest pain while interviewed in the ED.    Past Medical History:  Diagnosis Date  . Borderline diabetes    DIET CONTROLLED  . BPH (benign prostatic hypertrophy)    S/P TURP 1995  . Carotid artery disease ([place])    a. Carotid duplex in 05/2013: 82-42% RICA, <35% LICA. b. Per Patient, doppler 05/2015 showed 1-39% on the R - being folloewd by primary care.  . Cataract of right eye   . Coronary artery disease    a. 2003 s/p PCI/stenting; b. 02/2014 Abnl nuc -> Cath/PCI: LM nl, LAD nl, D2 95p (2.5x12 Promus DES), LCX <20, OM1 patent stent, RCA 50d, EF 55-65%. c. 08/2015: normal nuc.  [name] Dyslipidemia    History of dyslipidemia  . GERD (gastroesophageal reflux disease)   . Hypertension   . Mild depression ([place])   . Osteoarthritis    RIGHT ANKLE, BILATERAL WRIST, BACK  . Reflux OCCASIONAL   TAKES PRILOSEC  . Sinus bradycardia     Past Surgical History:  Procedure Laterality Date  . CARDIAC CATHETERIZATION  02-06-09   NO INTERVENTION  . CARDIAC CATHETERIZATION N/A 05/22/2016   Procedure: Left Heart Cath and Coronary Angiography;  Surgeon: Peter M Martinique, MD;  Location: [place] [facility];  Service: Cardiovascular;  Laterality: N/A;  . CAROTID STENT    . CERVICAL DISCECTOMY  1989   C4 - 5  . CHOLECYSTECTOMY  3016   W/ UMBILICAL HERNIA REPAIR  . CORONARY ANGIOPLASTY  2003- FIRST OBTUSE MARGIANL VESSEL    X1 CYPHER STENT  . CORONARY STENT PLACEMENT     DES to first diagonal        DR Martinique  . HERNIA REPAIR  0109   Umbilical   . INGUINAL HERNIA REPAIR  1985  . KNEE ARTHROSCOPY  11/05/2011   Procedure: ARTHROSCOPY KNEE;  Surgeon: Gearlean Alf;  Location: [facility];  Service: Orthopedics;  Laterality: Right;  RIGHT ARTHROSCOPY KNEE WITH DEBRIDEMENT, Chondroplasty  . LEFT HEART CATH AND CORONARY ANGIOGRAPHY N/A 05/01/2017   Procedure: Left Heart Cath and Coronary Angiography;  Surgeon: Martinique, Peter M, MD;  Location: [place] [facility];  Service: Cardiovascular;  Laterality: N/A;  . LEFT HEART CATHETERIZATION WITH CORONARY ANGIOGRAM N/A 02/10/2014   Procedure: LEFT HEART CATHETERIZATION WITH CORONARY ANGIOGRAM;  Surgeon: Peter M Martinique, MD;  Location[facility];  Service: Cardiovascular;  Laterality: N/A;  . LUMBAR LAMINECTOMY  1973   L4-S1  . PILONIDAL CYST EXCISION  1961  . SHOULDER ARTHROSCOPY     Bilateral  . TONSILLECTOMY AND ADENOIDECTOMY  CHILD  . TRANSURETHRAL RESECTION OF PROSTATE  1995     Medications Prior to Admission: Prior to Admission medications   Medication Sig Start Date End Date Taking? Authorizing Provider  aspirin 81 MG tablet Take 81 mg by mouth daily.    Yes [provider]  atorvastatin (LIPITOR) 40 MG tablet Take 1 tablet (40 mg total) by mouth daily. Patient taking differently: Take 80 mg by mouth daily.  12/09/18 12/04/19 Yes Martinique, Peter M, MD  buPROPion (WELLBUTRIN XL) 150 MG 24 hr tablet Take 150 mg by mouth daily.   Yes [provider]  calcium carbonate (TUMS - DOSED IN MG ELEMENTAL CALCIUM) 500 MG chewable tablet Chew 1 tablet by mouth daily as needed for indigestion or heartburn.   Yes [provider]  citalopram (CELEXA) 20 MG tablet Take 20 mg by mouth daily.   Yes [provider]  clopidogrel (PLAVIX) 75 MG tablet Take 75 mg by mouth daily.    Yes [provider]  furosemide (LASIX) 20 MG tablet Take 1 tablet (20 mg total) by mouth daily as needed for edema. 05/21/17  Yes Martinique, Peter M, MD  Lidocaine, Anorectal,  (RECTICARE EX) Apply 1 application topically daily as needed (ANKLE ITCHING).   Yes [provider]  loratadine (CLARITIN) 10 MG tablet Take 10 mg by mouth daily as needed for allergies.   Yes [provider]  losartan (COZAAR) 25 MG tablet TAKE 1 TABLET BY MOUTH  DAILY Patient taking differently: Take 25 mg by mouth daily.  03/11/19  Yes Martinique, Peter M, MD  metoprolol succinate (TOPROL-XL) 25 MG 24 hr tablet Take 12.5 mg by mouth daily.  09/09/12  Yes Darlin Coco, MD  naproxen sodium (ANAPROX) 220 MG tablet Take 220 mg by mouth daily.   Yes [provider]  nitroGLYCERIN (NITROSTAT) 0.4 MG SL tablet Place 0.4 mg under the tongue every 5 (five) minutes as needed for chest pain (x 3 tablets daily).  Yes [provider]  valACYclovir (VALTREX) 1000 MG tablet Take 1,000 mg by mouth daily as needed. Outbreak.   Yes [provider]  zolpidem (AMBIEN) 5 MG tablet Take 2.5 mg by mouth at bedtime as needed for sleep.  08/16/15  Yes [provider]  hydrOXYzine (ATARAX/VISTARIL) 10 MG tablet Take 1 tablet (10 mg total) by mouth 3 (three) times daily. Patient not taking: Reported on 05/11/2019 12/02/17   Alma Friendly, MD  ketoconazole (NIZORAL) 2 % shampoo Apply 1 application topically 2 (two) times a week. Patient not taking: Reported on 05/11/2019 12/03/17   Alma Friendly, MD  nystatin-triamcinolone ointment [facility]) Apply topically 2 (two) times daily. Patient not taking: Reported on 05/11/2019 12/02/17   Alma Friendly, MD     Allergies:    Allergies  Allergen Reactions  . Flexeril [Cyclobenzaprine] Swelling  . Methocarbamol Swelling  . Orphenadrine Citrate Swelling  . Lisinopril Cough  . Ramipril Other (See Comments)    DRY COUGH    Social History:   Social History   Socioeconomic History  . Marital status: Married    Spouse name: Not on file  . Number of children: Not on file  . Years of education: Not on file  .  Highest education level: Not on file  Occupational History  . Not on file  Social Needs  . Financial resource strain: Not on file  . Food insecurity:    Worry: Not on file    Inability: Not on file  . Transportation needs:    Medical: Not on file    Non-medical: Not on file  Tobacco Use  . Smoking status: Never Smoker  . Smokeless tobacco: Never Used  Substance and Sexual Activity  . Alcohol use: Yes    Comment: RARE  . Drug use: No  . Sexual activity: Not on file  Lifestyle  . Physical activity:    Days per week: Not on file    Minutes per session: Not on file  . Stress: Not on file  Relationships  . Social connections:    Talks on phone: Not on file    Gets together: Not on file    Attends religious service: Not on file    Active member of club or organization: Not on file    Attends meetings of clubs or organizations: Not on file    Relationship status: Not on file  . Intimate partner violence:    Fear of current or ex partner: Not on file    Emotionally abused: Not on file    Physically abused: Not on file    Forced sexual activity: Not on file  Other Topics Concern  . Not on file  Social History Narrative  . Not on file    Family History:   The patient's family history includes Hypertension in his mother; Lung cancer in his father. There is no history of Heart attack or Stroke.    ROS:  Please see the history of present illness.  All other ROS reviewed and negative.     Physical Exam/Data:   Vitals:   05/11/19 1115 05/11/19 1130 05/11/19 1300  BP: (!) 149/74 130/68 124/67  Pulse: 60 (!) 57 (!) 52  Resp: 16 (!) 21 (!) 21  SpO2: 96% 93% 94%   No intake or output data in the 24 hours ending 05/11/19 1327 Last 3 Weights 12/09/2018 12/01/2017 11/10/2017  Weight (lbs) 243 lb 251 lb 248 lb  Weight (kg) 110.224 kg 113.853  kg 112.492 kg     There is no height or weight on file to calculate BMI.  General:  Well nourished, well developed, in no acute distress  HEENT: normal Neck: no JVD Endocrine:  No thryomegaly Vascular: No carotid bruits; FA pulses 2+ bilaterally without bruits  Cardiac:  normal S1, S2; RRR; + murmur  Lungs:  clear to auscultation bilaterally, no wheezing, rhonchi or rales  Abd: soft, nontender, no hepatomegaly  Ext: no edema Musculoskeletal:  No deformities, BUE and BLE strength normal and equal Skin: warm and dry  Neuro:  CNs 2-12 intact, no focal abnormalities noted Psych:  Normal affect    EKG:  The ECG that was done 05/11/19 was personally reviewed and demonstrates SR with no acute ST changes  Relevant CV Studies:  TTE: 10/06  SUMMARY - Overall left ventricular systolic function was normal. Left    ventricular ejection fraction was estimated , range being 55    % to 65 %.. There were no left ventricular regional wall    motion abnormalities. Left ventricular wall thickness was    mildly to moderately increased. There was mild asymmetric    septal hypertrophy. There was no systolic anterior motion of    the mitral valve. There was an increased relative    contribution of atrial contraction to left ventricular    filling. Doppler parameters were consistent with abnormal    left ventricular relaxation. - Aortic valve thickness was mildly increased. There was trivial    aortic valvular regurgitation. - The left atrium was mildly dilated.  Cath: 05/01/17   Prox RCA lesion, 20 %stenosed.  Dist RCA lesion, 50 %stenosed.  1st Mrg lesion, 0 %stenosed.  1st Diag lesion, 0 %stenosed.  Ost 1st Mrg to 1st Mrg lesion, 20 %stenosed.  Prox Cx to Mid Cx lesion, 20 %stenosed.  The left ventricular systolic function is normal.  LV end diastolic pressure is moderately elevated.  The left ventricular ejection fraction is 55-65% by visual estimate.   1. Nonobstructive CAD 2. Normal LV function 3. Elevated LVEDP  Plan: compared to last angiogram there is no  significant change. Will continue medical therapy and consider other causes of chest pain.   Laboratory Data:  Chemistry Recent Labs  Lab 05/11/19 1032  NA 139  K 4.5  CL 105  CO2 24  GLUCOSE 98  BUN 14  CREATININE 1.10  CALCIUM 9.7  GFRNONAA >60  GFRAA >60  ANIONGAP 10    No results for input(s): PROT, ALBUMIN, AST, ALT, ALKPHOS, BILITOT in the last 168 hours. Hematology Recent Labs  Lab 05/11/19 1032  WBC 5.1  RBC 5.06  HGB 15.5  HCT 46.8  MCV 92.5  MCH 30.6  MCHC 33.1  RDW 13.6  PLT 178   Cardiac Enzymes Recent Labs  Lab 05/11/19 1032  TROPONINI <0.03   No results for input(s): TROPIPOC in the last 168 hours.  BNPNo results for input(s): BNP, PROBNP in the last 168 hours.  DDimer No results for input(s): DDIMER in the last 168 hours.  Radiology/Studies:  Dg Chest 2 View  Result Date: 05/11/2019 CLINICAL DATA:  Chest pain EXAM: CHEST - 2 VIEW COMPARISON:  04/27/2017 FINDINGS: Eventration of the right diaphragm, stable. Normal heart size and mediastinal contours. Mild atelectasis at the bases. There is no edema, consolidation, effusion, or pneumothorax. IMPRESSION: No acute disease. Electronically Signed   By: Monte Fantasia [name]   On: 05/11/2019 11:04    Assessment and Plan:   Jared Clark is a 83 y.o. male with PMH of CAD s/p OM '03, HTN, HL, GERD, BPH, carotid artery disease, and DM who presented with chest pain.  1. Chest pain: awoke with symptoms around 1am which were responsive to 3 SL nitro. Symptoms persisted throughout the morning and presented to the ED. Troponin neg x1. Does have a hx of CAD with prior stenting. Do not feel like a stress test would beneficial. Therefore will plan for cardiac cath today.  -- The patient understands that risks included but are not limited to stroke (1 in 1000), death (1 in 50), kidney failure [usually temporary] (1 in 500), bleeding (1 in 200), allergic reaction [possibly serious] (1 in 200).   2. CAD: prior  stenting of OM, Diag. Remains on ASA, plavix  3. HTN: bp stable with BB and ARB  4. HL: on atorva 40mg  daily  5. Systolic murmur: will update echo   Severity of Illness: The appropriate patient status for this patient is OBSERVATION. Observation status is judged to be reasonable and necessary in order to provide the required intensity of service to ensure the patient's safety. The patient's presenting symptoms, physical exam findings, and initial radiographic and laboratory data in the context of their medical condition is felt to place them at decreased risk for further clinical deterioration. Furthermore, it is anticipated that the patient will be medically stable for discharge from the hospital within 2 midnights of admission. The following factors support the patient status of observation.   " The patient's presenting symptoms include chest pain. " The physical exam findings include stable exam, + murmur. " The initial radiographic and laboratory data are stable.    For questions or updates, please contact [place] Please consult [email or web address] for contact info under        Signed, Reino Bellis, NP  05/11/2019 1:27 PM   I have personally seen and examined this patient. I agree with the assessment and plan as outlined above.  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 No dyspnea. Troponin is negative. EKG shows sinus without ischemic changes-reviewed by me.  My exam:  General: Well developed, well nourished, NAD  HEENT: OP clear, mucus membranes moist  SKIN: warm, dry. No rashes. Neuro: No focal deficits  Musculoskeletal: Muscle strength 5/5 all ext  Psychiatric: Mood and affect normal  Neck: No JVD, no carotid bruits, no thyromegaly, no lymphadenopathy.  Lungs:Clear bilaterally, no wheezes, rhonci, crackles Cardiovascular: Regular rate and rhythm. No murmurs, gallops or rubs. Abdomen:Soft.  Bowel sounds present. Non-tender.  Extremities: No lower extremity edema. Pulses are 2 + in the bilateral DP/PT.  Plan: Unstable angina: He has chest pain worrisome for angina. Last cath was in 2018. Will plan cardiac cath later today. If no change in disease, consider Ranexa or Imdur.   Lauree Chandler 05/11/2019 3:15 PM

## 2019-05-11 NOTE — ED Triage Notes (Signed)
To ED for eval of left side cp that woke pt this am around 0100. Pt took 3 nitro a few min apart with relief after the 3rd. Pt was able to go back to sleep until 0700 when he woke again with pain. Hx of stents. Never had pain like this. States pain increases with walking.

## 2019-05-11 NOTE — Telephone Encounter (Signed)
Spoke with pt, he woke at 1 am this morning with chest pain, it was an 8.5 on scale 1-10. He took 3 NTG before the pain eased and he went back to sleep. This morning he has noticed off and on chest discomfort that is not as intense as last night and he has not taken any NTG this morning. He denies SOB or radiation of the discomfort. He reports the chest pain he had during the night is similar to what he had prior to the stents he has had, the discomfort this morning is not as intense. He feels he needs an EKG. Will discuss with dr Harrell Gave, DOD.

## 2019-05-11 NOTE — ED Notes (Signed)
Cards NP at bedside  

## 2019-05-12 ENCOUNTER — Encounter (HOSPITAL_COMMUNITY): Payer: Self-pay | Admitting: Cardiology

## 2019-05-12 ENCOUNTER — Observation Stay (HOSPITAL_BASED_OUTPATIENT_CLINIC_OR_DEPARTMENT_OTHER): Payer: Medicare Other

## 2019-05-12 DIAGNOSIS — I361 Nonrheumatic tricuspid (valve) insufficiency: Secondary | ICD-10-CM

## 2019-05-12 DIAGNOSIS — I25119 Atherosclerotic heart disease of native coronary artery with unspecified angina pectoris: Secondary | ICD-10-CM

## 2019-05-12 LAB — LIPID PANEL
Cholesterol: 88 mg/dL (ref 0–200)
HDL: 34 mg/dL — ABNORMAL LOW (ref >40)
LDL Cholesterol: 39 mg/dL (ref 0–99)
Total CHOL/HDL Ratio: 2.6 RATIO
Triglycerides: 75 mg/dL (ref <150)
VLDL: 15 mg/dL (ref 0–40)

## 2019-05-12 LAB — BASIC METABOLIC PANEL
Anion gap: 7 (ref 5–15)
BUN: 13 mg/dL (ref 8–23)
CO2: 23 mmol/L (ref 22–32)
Calcium: 8.9 mg/dL (ref 8.9–10.3)
Chloride: 107 mmol/L (ref 98–111)
Creatinine, Ser: 0.97 mg/dL (ref 0.61–1.24)
GFR calc Af Amer: >60 mL/min (ref >60)
GFR calc non Af Amer: >60 mL/min (ref >60)
Glucose, Bld: 79 mg/dL (ref 70–99)
Potassium: 4 mmol/L (ref 3.5–5.1)
Sodium: 137 mmol/L (ref 135–145)

## 2019-05-12 LAB — ECHOCARDIOGRAM COMPLETE
Height: 76 in
Weight: 3848 oz

## 2019-05-12 MED ORDER — RANOLAZINE ER 500 MG PO TB12
500.0000 mg | ORAL_TABLET | Freq: Two times a day (BID) | ORAL | Status: DC
  Administered 2019-05-12: 500 mg via ORAL
  Filled 2019-05-12 (×2): qty 1

## 2019-05-12 MED ORDER — RANOLAZINE ER 500 MG PO TB12: 500.0000 mg | ORAL_TABLET | Freq: Two times a day (BID) | ORAL | 1 refills | Status: DC

## 2019-05-12 NOTE — Discharge Summary (Signed)
Discharge Summary    Patient ID: Jared Clark,  MRN: [id number], DOB/AGE: 1936/09/15 83 y.o.  Admit date: 05/11/2019 Discharge date: 05/12/2019  Primary Care Provider: Marton Redwood Primary Cardiologist: Dr. Martinique   Discharge Diagnoses    Active Problems:   Chest pain   Allergies Allergies  Allergen Reactions  . Flexeril [Cyclobenzaprine] Swelling  . Methocarbamol Swelling  . Orphenadrine Citrate Swelling  . Lisinopril Cough  . Ramipril Other (See Comments)    DRY COUGH    Diagnostic Studies/Procedures    Cath: 05/11/19   Non-stenotic 1st Diag lesion was previously treated.  Prox Cx to Mid Cx lesion is 20% stenosed.  Non-stenotic 1st Mrg lesion was previously treated.  Ost 1st Mrg to 1st Mrg lesion is 20% stenosed.  Prox RCA lesion is 20% stenosed.  Dist RCA lesion is 50% stenosed.  The left ventricular systolic function is normal.  LV end diastolic pressure is normal.  The left ventricular ejection fraction is 55-65% by visual estimate.   1. Nonobstructive CAD- unchanged from May 2018 2. Normal LV function 3. Normal LVEDP  Plan: no new findings to explain his chest pain. Continue medical therapy. _____________   History of Present Illness     [name] is an 83 yo male with PMH noted above. He is followed by Dr. Martinique as an outpatient. Underwent cardiac cath back in 2003 with stent to the 1st OM. Had an abnormal myoview in 2010 with repeat cardiac cath that showed patency of previous stent with mild nonobstructive disease. Normal EF noted. Underwent repeat lexiscan in feburary of 2015 that showed reversible ischemia anterolaterally. Was referred for cardiac cath with successful stenting of a 2nd diag. Seen again in 9/19 after complaining of atypical chest pain with myoview showing no ischemia. Felt to be a low risk study. In June 2017 presented with atypical chest pain and referred for cardiac cath, which was unchanged.   May 2018 had recurrent  chest pain, and myoview showed small inferior and apical area of ischemia. Cath was recommended, showing nonobstructive CAD unchanged from prior studies. LV function was normal. He was started on prilosec with improvement in symptoms.   He was last seen in the office on 12/09/18 and reported doing well. Did have occasional precordial chest pain, nonexertional. Very brief when present. Medications were continued the same with the exception of reducing his lipitor to 40mg  daily.   Reported the morning of admission he awoke with left sided chest pain around 1am. Stated it felt like a pressure right over his heart. He took a total of 3 SL nitro with improvement in symptoms by the 3rd nitro. Stated he was able to fall back asleep until around 7am. After getting up later that morning he continued to have intermittent episodes of chest pain with one episode of radiation towards his left shoulder. He became concerned with these ongoing symptoms and called the office. Office advised he come to the ED for further evaluation.   In the ED his labs showed stable electrolytes, trop neg x1, Hgb 15.5.  CXR was negative. EKG showed SR with no acute ST/TW changes. He was having intermittent episodes of chest pain while interviewed in the ED.    Given symptoms he was admitted and taken for cardiac cath.   Hospital Course      Cath noted above with nonobstructive CAD unchanged from previous cath back in May 2018. Normal LV function and LVEDP. Continue medical management of CAD. Will continue  Plavix, statin, beta blocker and ARB. Will start Ranexa 500 mg po BID for possible microvascular angina. Offered to sen Rx to the [facility] pharmacy but patient declined. Rx sent to [facility].    NOHLAN Clark was seen by Dr. Angelena Form and determined stable for discharge home. Follow up in the office has been arranged. Medications are listed below.   _____________  Discharge Vitals Blood pressure (!) 123/53, pulse (!) 55,  temperature 98.1 F (36.7 C), temperature source Oral, resp. rate 18, height 6\' 4"  (1.93 m), weight 109.1 kg, SpO2 94 %.  Filed Weights   05/11/19 1604 05/12/19 0445  Weight: 108.9 kg 109.1 kg    Labs & Radiologic Studies    CBC Recent Labs    05/11/19 1032  WBC 5.1  HGB 15.5  HCT 46.8  MCV 92.5  PLT 222   Basic Metabolic Panel Recent Labs    05/11/19 1032 05/12/19 0408  NA 139 137  K 4.5 4.0  CL 105 107  CO2 24 23  GLUCOSE 98 79  BUN 14 13  CREATININE 1.10 0.97  CALCIUM 9.7 8.9   Liver Function Tests No results for input(s): AST, ALT, ALKPHOS, BILITOT, PROT, ALBUMIN in the last 72 hours. No results for input(s): LIPASE, AMYLASE in the last 72 hours. Cardiac Enzymes Recent Labs    05/11/19 1032 05/11/19 1336  TROPONINI <0.03 <0.03   BNP Invalid input(s): POCBNP D-Dimer No results for input(s): DDIMER in the last 72 hours. Hemoglobin A1C No results for input(s): HGBA1C in the last 72 hours. Fasting Lipid Panel Recent Labs    05/12/19 0408  CHOL 88  HDL 34*  LDLCALC 39  TRIG 75  CHOLHDL 2.6   Thyroid Function Tests No results for input(s): TSH, T4TOTAL, T3FREE, THYROIDAB in the last 72 hours.  Invalid input(s): FREET3 _____________  Dg Chest 2 View  Result Date: 05/11/2019 CLINICAL DATA:  Chest pain EXAM: CHEST - 2 VIEW COMPARISON:  04/27/2017 FINDINGS: Eventration of the right diaphragm, stable. Normal heart size and mediastinal contours. Mild atelectasis at the bases. There is no edema, consolidation, effusion, or pneumothorax. IMPRESSION: No acute disease. Electronically Signed   By: Monte Fantasia [name]   On: 05/11/2019 11:04   Disposition   Pt is being discharged home today in good condition.  Follow-up Plans & Appointments    Follow-up Information    Lendon Colonel, NP On 05/30/2019.   Specialties:  Nurse Practitioner, Radiology, Cardiology Why:  at [place] for your follow up appt. This will be done through your mobile phone as a  virtual visit.  Contact information: [address] [place] [address] [phone]        Marton Redwood, MD.   Specialty:  Internal Medicine Why:  unable to make an appointment, sent to voicemail and asked to call patient back Contact information: [place] [address] [phone]          Discharge Instructions    Call MD for:  redness, tenderness, or signs of infection (pain, swelling, redness, odor or green/yellow discharge around incision site)   Complete by:  As directed    Diet - low sodium heart healthy   Complete by:  As directed    Discharge instructions   Complete by:  As directed    Radial Site Care Refer to this sheet in the next few weeks. These instructions provide you with information on caring for yourself after your procedure. Your caregiver may also give you  more specific instructions. Your treatment has been planned according to current medical practices, but problems sometimes occur. Call your caregiver if you have any problems or questions after your procedure. HOME CARE INSTRUCTIONS You may shower the day after the procedure.Remove the bandage (dressing) and gently wash the site with plain soap and water.Gently pat the site dry.  Do not apply powder or lotion to the site.  Do not submerge the affected site in water for 3 to 5 days.  Inspect the site at least twice daily.  Do not flex or bend the affected arm for 24 hours.  No lifting over 5 pounds (2.3 kg) for 5 days after your procedure.  Do not drive home if you are discharged the same day of the procedure. Have someone else drive you.  You may drive 24 hours after the procedure unless otherwise instructed by your caregiver.  What to expect: Any bruising will usually fade within 1 to 2 weeks.  Blood that collects in the tissue (hematoma) may be painful to the touch. It should usually decrease in size and tenderness within 1 to 2 weeks.  SEEK IMMEDIATE MEDICAL CARE  IF: You have unusual pain at the radial site.  You have redness, warmth, swelling, or pain at the radial site.  You have drainage (other than a small amount of blood on the dressing).  You have chills.  You have a fever or persistent symptoms for more than 72 hours.  You have a fever and your symptoms suddenly get worse.  Your arm becomes pale, cool, tingly, or numb.  You have heavy bleeding from the site. Hold pressure on the site.   Increase activity slowly   Complete by:  As directed        Discharge Medications     Medication List    STOP taking these medications   nystatin-triamcinolone ointment Commonly known as:  MYCOLOG     TAKE these medications   aspirin 81 MG tablet Take 81 mg by mouth daily.   atorvastatin 40 MG tablet Commonly known as:  LIPITOR Take 1 tablet (40 mg total) by mouth daily. What changed:  how much to take   buPROPion 150 MG 24 hr tablet Commonly known as:  WELLBUTRIN XL Take 150 mg by mouth daily.   calcium carbonate 500 MG chewable tablet Commonly known as:  TUMS - dosed in mg elemental calcium Chew 1 tablet by mouth daily as needed for indigestion or heartburn.   citalopram 20 MG tablet Commonly known as:  CELEXA Take 20 mg by mouth daily.   clopidogrel 75 MG tablet Commonly known as:  PLAVIX Take 75 mg by mouth daily.   furosemide 20 MG tablet Commonly known as:  LASIX Take 1 tablet (20 mg total) by mouth daily as needed for edema.   hydrOXYzine 10 MG tablet Commonly known as:  ATARAX/VISTARIL Take 1 tablet (10 mg total) by mouth 3 (three) times daily.   ketoconazole 2 % shampoo Commonly known as:  NIZORAL Apply 1 application topically 2 (two) times a week.   loratadine 10 MG tablet Commonly known as:  CLARITIN Take 10 mg by mouth daily as needed for allergies.   losartan 25 MG tablet Commonly known as:  COZAAR TAKE 1 TABLET BY MOUTH  DAILY   metoprolol succinate 25 MG 24 hr tablet Commonly known as:  TOPROL-XL  Take 12.5 mg by mouth daily.   naproxen sodium 220 MG tablet Commonly known as:  ALEVE Take 220 mg by  mouth daily.   Nitrostat 0.4 MG SL tablet Generic drug:  nitroGLYCERIN Place 0.4 mg under the tongue every 5 (five) minutes as needed for chest pain (x 3 tablets daily).   ranolazine 500 MG 12 hr tablet Commonly known as:  RANEXA Take 1 tablet (500 mg total) by mouth 2 (two) times daily.   RECTICARE EX Apply 1 application topically daily as needed (ANKLE ITCHING).   valACYclovir 1000 MG tablet Commonly known as:  VALTREX Take 1,000 mg by mouth daily as needed. Outbreak.   zolpidem 5 MG tablet Commonly known as:  AMBIEN Take 2.5 mg by mouth at bedtime as needed for sleep.        Acute coronary syndrome (MI, NSTEMI, STEMI, etc) this admission?: No.     Outstanding Labs/Studies   N/a   Duration of Discharge Encounter   Greater than 30 minutes including physician time.  Signed, Reino Bellis NP-C 05/12/2019, 1:04 PM

## 2019-05-12 NOTE — Progress Notes (Signed)
Progress Note  Patient Name: Jared Clark Date of Encounter: 05/12/2019  Primary Cardiologist: Martinique  Subjective   Several episodes of mild chest pain this am.   Inpatient Medications    Scheduled Meds: . atorvastatin  40 mg Oral Daily  . buPROPion  150 mg Oral Daily  . citalopram  20 mg Oral Daily  . clopidogrel  75 mg Oral Daily  . heparin  5,000 Units Subcutaneous Q8H  . losartan  25 mg Oral Daily  . metoprolol succinate  12.5 mg Oral Daily  . sodium chloride flush  3 mL Intravenous Q12H   Continuous Infusions: . sodium chloride     PRN Meds: sodium chloride, acetaminophen, nitroGLYCERIN, ondansetron (ZOFRAN) IV, sodium chloride flush, zolpidem   Vital Signs    Vitals:   05/12/19 0006 05/12/19 0429 05/12/19 0445 05/12/19 0741  BP: 127/69 136/62  (!) 142/70  Pulse: (!) 53 (!) 55  (!) 58  Resp: 20 18  18   Temp: 97.7 F (36.5 C) 97.6 F (36.4 C)  98.1 F (36.7 C)  TempSrc: Oral Oral  Oral  SpO2: 94% 95%  94%  Weight:   109.1 kg   Height:        Intake/Output Summary (Last 24 hours) at 05/12/2019 0849 Last data filed at 05/12/2019 0816 Gross per 24 hour  Intake 1743.8 ml  Output 1450 ml  Net 293.8 ml   Last 3 Weights 05/12/2019 05/11/2019 12/09/2018  Weight (lbs) 240 lb 8 oz 240 lb 243 lb  Weight (kg) 109.09 kg 108.863 kg 110.224 kg      Telemetry    Sinus - Personally Reviewed  ECG    No AM EKG - Personally Reviewed  Physical Exam   GEN: No acute distress.   Neck: No JVD Cardiac: RRR, no murmurs, rubs, or gallops.  Respiratory: Clear to auscultation bilaterally. GI: Soft, nontender, non-distended  MS: No edema; No deformity. Neuro:  Nonfocal  Psych: Normal affect   Labs    Chemistry Recent Labs  Lab 05/11/19 1032 05/12/19 0408  NA 139 137  K 4.5 4.0  CL 105 107  CO2 24 23  GLUCOSE 98 79  BUN 14 13  CREATININE 1.10 0.97  CALCIUM 9.7 8.9  GFRNONAA >60 >60  GFRAA >60 >60  ANIONGAP 10 7     Hematology Recent Labs  Lab  05/11/19 1032  WBC 5.1  RBC 5.06  HGB 15.5  HCT 46.8  MCV 92.5  MCH 30.6  MCHC 33.1  RDW 13.6  PLT 178    Cardiac Enzymes Recent Labs  Lab 05/11/19 1032 05/11/19 1336  TROPONINI <0.03 <0.03   No results for input(s): TROPIPOC in the last 168 hours.   BNPNo results for input(s): BNP, PROBNP in the last 168 hours.   DDimer No results for input(s): DDIMER in the last 168 hours.   Radiology    Dg Chest 2 View  Result Date: 05/11/2019 CLINICAL DATA:  Chest pain EXAM: CHEST - 2 VIEW COMPARISON:  04/27/2017 FINDINGS: Eventration of the right diaphragm, stable. Normal heart size and mediastinal contours. Mild atelectasis at the bases. There is no edema, consolidation, effusion, or pneumothorax. IMPRESSION: No acute disease. Electronically Signed   By: Monte Fantasia [name]   On: 05/11/2019 11:04    Cardiac Studies   Cardiac cath 05/11/19:  Non-stenotic 1st Diag lesion was previously treated.  Prox Cx to Mid Cx lesion is 20% stenosed.  Non-stenotic 1st Mrg lesion was previously treated.  Ost 1st  Mrg to 1st Mrg lesion is 20% stenosed.  Prox RCA lesion is 20% stenosed.  Dist RCA lesion is 50% stenosed.  The left ventricular systolic function is normal.  LV end diastolic pressure is normal.  The left ventricular ejection fraction is 55-65% by visual estimate.   1. Nonobstructive CAD- unchanged from May 2018 2. Normal LV function 3. Normal LVEDP  Patient Profile     83 y.o. male with PMH of CAD s/p OM '03, HTN, HL, GERD, BPH, carotid artery disease, and DM who presented with chest pain. Troponin negative. Cardiac cath 05/11/19 with stable CAD.   Assessment & Plan    1. CAD with angina: No evidence of ACS. Cardiac cath with stable CAD. Continue medical management of CAD. Will continue Plavix, statin, beta blocker and ARB. Will start Ranexa 500 mg po BID for possible microvascular angina. Discharge home today. Follow up with Dr. Martinique in 2 weeks.   For questions or  updates, please contact [place] Please consult [email or web address] for contact info under        Signed, Lauree Chandler, MD  05/12/2019, 8:49 AM

## 2019-05-12 NOTE — Progress Notes (Signed)
  Echocardiogram 2D Echocardiogram has been performed.  Jared Clark 05/12/2019, 9:19 AM

## 2019-05-24 ENCOUNTER — Telehealth: Payer: Self-pay | Admitting: Adult Health

## 2019-05-24 NOTE — Telephone Encounter (Signed)
flip phone(no smartphone/ consent/ my chart/ pre reg completed

## 2019-05-28 NOTE — Progress Notes (Signed)
Virtual Visit via Telephone Note   This visit type was conducted due to national recommendations for restrictions regarding the COVID-19 Pandemic (e.g. social distancing) in an effort to limit this patient's exposure and mitigate transmission in our community.  Due to his co-morbid illnesses, this patient is at least at moderate risk for complications without adequate follow up.  This format is felt to be most appropriate for this patient at this time.  The patient did not have access to video technology/had technical difficulties with video requiring transitioning to audio format only (telephone).  All issues noted in this document were discussed and addressed.  No physical exam could be performed with this format.  Please refer to the patient's chart for his  consent to telehealth for [facility].   Date:  05/30/2019   ID:  Jared Clark, DOB Sep 21, 1936, MRN [id number]  Patient Location: Home Provider Location: Office  PCP:  Marton Redwood, MD   Cardiologist:  Peter Martinique, MD  Electrophysiologist:  None   Evaluation Performed:  [facility] Follow up.   Chief Complaint: Posthospitalization, status post cath for chest pain.  History of Present Illness:    Jared Clark is a 83 y.o. male we are seeing for posthospitalization follow-up after admission for recurrent chest pain with known history of CAD with stent to the first OM in 2002, repeat cardiac catheterization in 2010 revealing patent stent with mild nonobstructive disease, repeat Lexiscan in 2015 revealing reversible ischemia anterior laterally, with cardiac catheterization and successful stenting of the second diagonal.  He had an additional cardiac catheterization in May 2018 for recurrent chest pain which revealed nonobstructive CAD unchanged from prior studies.  The patient reported he was awakened with left-sided chest pain around 11 AM on day of admission described as pressure over his right heart, taking 3  nitroglycerin with improvement in his symptoms by the third nitro.  He was able to fall back asleep but upon awakening around 7 AM the patient began to have intermittent episode of chest pain with one episode radiation towards his left shoulder.  As result he came to the emergency room.  Cardiac catheterization was completed on 05/11/2019 revealing nonobstructive CAD unchanged from previous cath back in May 2018 with normal LV function and LVEDP.  He was to be continued on medical management with Plavix, statin therapy, beta-blocker, and ARB.  Ranexa was initiated at 500 mg twice daily for possible microvascular angina. He states that he had one brief episode of chest pain lasting 10 seconds, "2/10" but no other episodes.     The patient does have symptoms concerning for COVID-19 infection (fever, chills, cough, or new shortness of breath).    Past Medical History:  Diagnosis Date  . Anginal pain ([place])   . Borderline diabetes    DIET CONTROLLED  . BPH (benign prostatic hypertrophy)    S/P TURP 1995  . Carotid artery disease ([place])    a. Carotid duplex in 05/2013: 48-25% RICA, <00% LICA. b. Per Patient, doppler 05/2015 showed 1-39% on the R - being folloewd by primary care.  . Cataract of right eye   . Coronary artery disease    a. 2003 s/p PCI/stenting; b. 02/2014 Abnl nuc -> Cath/PCI: LM nl, LAD nl, D2 95p (2.5x12 Promus DES), LCX <20, OM1 patent stent, RCA 50d, EF 55-65%. c. 08/2015: normal nuc.  . Depression   . Dyslipidemia    History of dyslipidemia  . GERD (gastroesophageal reflux disease)   . Hypertension   .  Osteoarthritis    RIGHT ANKLE, BILATERAL WRIST, BACK  . Reflux OCCASIONAL   TAKES PRILOSEC  . Sinus bradycardia    Past Surgical History:  Procedure Laterality Date  . BACK SURGERY    . CARDIAC CATHETERIZATION  02-06-09   NO INTERVENTION  . CARDIAC CATHETERIZATION N/A 05/22/2016   Procedure: Left Heart Cath and Coronary Angiography;  Surgeon: Peter M Martinique, MD;  Location:  [facility];  Service: Cardiovascular;  Laterality: N/A;  . CAROTID STENT    . CERVICAL DISCECTOMY  1989   C4 - 5  . CHOLECYSTECTOMY  2831   W/ UMBILICAL HERNIA REPAIR  . CORONARY ANGIOPLASTY  2003- FIRST OBTUSE MARGIANL VESSEL    X1 CYPHER STENT  . CORONARY STENT PLACEMENT     DES to first diagonal        DR Martinique  . EYE SURGERY    . HERNIA REPAIR  5176   Umbilical  . INGUINAL HERNIA REPAIR  1985  . KNEE ARTHROSCOPY  11/05/2011   Procedure: ARTHROSCOPY KNEE;  Surgeon: Gearlean Alf;  Location: [facility];  Service: Orthopedics;  Laterality: Right;  RIGHT ARTHROSCOPY KNEE WITH DEBRIDEMENT, Chondroplasty  . LEFT HEART CATH AND CORONARY ANGIOGRAPHY N/A 05/01/2017   Procedure: Left Heart Cath and Coronary Angiography;  Surgeon: Martinique, Peter M, MD;  Location: [place] [facility];  Service: Cardiovascular;  Laterality: N/A;  . LEFT HEART CATH AND CORONARY ANGIOGRAPHY N/A 05/11/2019   Procedure: LEFT HEART CATH AND CORONARY ANGIOGRAPHY;  Surgeon: Martinique, Peter M, MD;  Location: [facility];  Service: Cardiovascular;  Laterality: N/A;  . LEFT HEART CATHETERIZATION WITH CORONARY ANGIOGRAM N/A 02/10/2014   Procedure: LEFT HEART CATHETERIZATION WITH CORONARY ANGIOGRAM;  Surgeon: Peter M Martinique, MD;  Location: [name];  Service: Cardiovascular;  Laterality: N/A;  . LUMBAR LAMINECTOMY  1973   L4-S1  . PILONIDAL CYST EXCISION  1961  . SHOULDER ARTHROSCOPY     Bilateral  . TONSILLECTOMY AND ADENOIDECTOMY  CHILD  . TRANSURETHRAL RESECTION OF PROSTATE  1995     Current Meds  Medication Sig  . aspirin 81 MG tablet Take 81 mg by mouth daily.   [name] atorvastatin (LIPITOR) 40 MG tablet Take 1 tablet (40 mg total) by mouth daily. (Patient taking differently: Take 80 mg by mouth daily. )  . buPROPion (WELLBUTRIN XL) 150 MG 24 hr tablet Take 150 mg by mouth daily.  . calcium carbonate (TUMS - DOSED IN MG ELEMENTAL CALCIUM) 500 MG chewable tablet Chew 1 tablet by mouth daily  as needed for indigestion or heartburn.  . citalopram (CELEXA) 20 MG tablet Take 20 mg by mouth daily.  . clopidogrel (PLAVIX) 75 MG tablet Take 75 mg by mouth daily.   . furosemide (LASIX) 20 MG tablet Take 1 tablet (20 mg total) by mouth daily as needed for edema.  . Lidocaine, Anorectal, (RECTICARE EX) Apply 1 application topically daily as needed (ANKLE ITCHING).  [name] loratadine (CLARITIN) 10 MG tablet Take 10 mg by mouth daily as needed for allergies.  [name] losartan (COZAAR) 25 MG tablet TAKE 1 TABLET BY MOUTH  DAILY (Patient taking differently: Take 25 mg by mouth daily. )  . metoprolol succinate (TOPROL-XL) 25 MG 24 hr tablet Take 12.5 mg by mouth daily.   . naproxen sodium (ANAPROX) 220 MG tablet Take 220 mg by mouth daily.  . nitroGLYCERIN (NITROSTAT) 0.4 MG SL tablet Place 0.4 mg under the tongue every 5 (five) minutes as needed  for chest pain (x 3 tablets daily).  . ranolazine (RANEXA) 500 MG 12 hr tablet Take 1 tablet (500 mg total) by mouth 2 (two) times daily.  . valACYclovir (VALTREX) 1000 MG tablet Take 1,000 mg by mouth daily as needed. Outbreak.  . zolpidem (AMBIEN) 5 MG tablet Take 2.5 mg by mouth at bedtime as needed for sleep.      Allergies:   Flexeril [cyclobenzaprine], Methocarbamol, Orphenadrine citrate, Lisinopril, and Ramipril   Social History   Tobacco Use  . Smoking status: Never Smoker  . Smokeless tobacco: Never Used  Substance Use Topics  . Alcohol use: Yes    Comment: RARE  . Drug use: No     Family Hx: The patient's family history includes Hypertension in his mother; Lung cancer in his father. There is no history of Heart attack or Stroke.  ROS:   Please see the history of present illness.    All other systems reviewed and are negative.   Prior CV studies:   The following studies were reviewed today: Cath: 05/11/19   Non-stenotic 1st Diag lesion was previously treated.  Prox Cx to Mid Cx lesion is 20% stenosed.  Non-stenotic 1st Mrg lesion was  previously treated.  Ost 1st Mrg to 1st Mrg lesion is 20% stenosed.  Prox RCA lesion is 20% stenosed.  Dist RCA lesion is 50% stenosed.  The left ventricular systolic function is normal.  LV end diastolic pressure is normal.  The left ventricular ejection fraction is 55-65% by visual estimate.  1. Nonobstructive CAD- unchanged from May 2018 2. Normal LV function 3. Normal LVEDP  Plan: no new findings to explain his chest pain. Continue medical therapy.  Labs/Other Tests and Data Reviewed:    EKG: Not done this visit.   Recent Labs: 05/11/2019: Hemoglobin 15.5; Platelets 178 05/12/2019: BUN 13; Creatinine, Ser 0.97; Potassium 4.0; Sodium 137   Recent Lipid Panel Lab Results  Component Value Date/Time   CHOL 88 05/12/2019 04:08 AM   TRIG 75 05/12/2019 04:08 AM   HDL 34 (L) 05/12/2019 04:08 AM   CHOLHDL 2.6 05/12/2019 04:08 AM   LDLCALC 39 05/12/2019 04:08 AM    Wt Readings from Last 3 Encounters:  05/30/19 243 lb (110.2 kg)  05/12/19 240 lb 8 oz (109.1 kg)  12/09/18 243 lb (110.2 kg)     Objective:    Vital Signs:  BP 127/72   Pulse (!) 56   Ht 6\' 4"  (1.93 m)   Wt 243 lb (110.2 kg)   BMI 29.58 kg/m    VITAL SIGNS:  reviewed GEN:  no acute distress CARDIOVASCULAR:  Chest pain once.  NEURO:  alert and oriented x 3, no obvious focal deficit PSYCH:  normal affect  ASSESSMENT & PLAN:    1. CAD: S/P cardiac cath 05/2019 with now new blockages,most prominent was RCA with 50%. He was placed on Ranexa but has not taken it as he has not had recurrent chest pain. He will continue discharge medications as directed. He is sedentary. I have asked him to take a walk in the morning when it is cool to help with energy level. If he finds that he is having chest pain again, he is to take the Ranexa to allow him to continue with activity to be pain free.   2. Hyperlipidemia: He is to continue statin therapy.  Follow up labs in 6 months with goal of LDL < 70.   3. Hypertension:  BP is well controlled today. No changes  in regimen.   COVID-19 Education: The signs and symptoms of COVID-19 were discussed with the patient and how to seek care for testing (follow up with PCP or arrange E-visit). The importance of social distancing was discussed today.  Time:   Today, I have spent 10 minutes with the patient with telehealth technology discussing the above problems.     Medication Adjustments/Labs and Tests Ordered: Current medicines are reviewed at length with the patient today.  Concerns regarding medicines are outlined above.   Tests Ordered: No orders of the defined types were placed in this encounter.   Medication Changes: No orders of the defined types were placed in this encounter.   Disposition:  Follow up 6 months   Signed, Phill Myron. West Pugh, ANP, AACC  05/30/2019 9:05 AM    [facility]

## 2019-05-30 ENCOUNTER — Encounter: Payer: Self-pay | Admitting: Adult Health

## 2019-05-30 ENCOUNTER — Telehealth (INDEPENDENT_AMBULATORY_CARE_PROVIDER_SITE_OTHER): Payer: Medicare Other | Admitting: Adult Health

## 2019-05-30 VITALS — BP 127/72 | HR 56 | Ht 76.0 in | Wt 243.0 lb

## 2019-05-30 DIAGNOSIS — I10 Essential (primary) hypertension: Secondary | ICD-10-CM

## 2019-05-30 DIAGNOSIS — E7800 Pure hypercholesterolemia, unspecified: Secondary | ICD-10-CM

## 2019-05-30 DIAGNOSIS — I25119 Atherosclerotic heart disease of native coronary artery with unspecified angina pectoris: Secondary | ICD-10-CM

## 2019-06-21 DIAGNOSIS — Z125 Encounter for screening for malignant neoplasm of prostate: Secondary | ICD-10-CM | POA: Diagnosis not present

## 2019-06-21 DIAGNOSIS — E7849 Other hyperlipidemia: Secondary | ICD-10-CM | POA: Diagnosis not present

## 2019-06-21 DIAGNOSIS — I10 Essential (primary) hypertension: Secondary | ICD-10-CM | POA: Diagnosis not present

## 2019-06-21 DIAGNOSIS — R7301 Impaired fasting glucose: Secondary | ICD-10-CM | POA: Diagnosis not present

## 2019-06-21 DIAGNOSIS — R82998 Other abnormal findings in urine: Secondary | ICD-10-CM | POA: Diagnosis not present

## 2019-06-28 DIAGNOSIS — I10 Essential (primary) hypertension: Secondary | ICD-10-CM | POA: Diagnosis not present

## 2019-06-28 DIAGNOSIS — Z9861 Coronary angioplasty status: Secondary | ICD-10-CM | POA: Diagnosis not present

## 2019-06-28 DIAGNOSIS — R7301 Impaired fasting glucose: Secondary | ICD-10-CM | POA: Diagnosis not present

## 2019-06-28 DIAGNOSIS — I6529 Occlusion and stenosis of unspecified carotid artery: Secondary | ICD-10-CM | POA: Diagnosis not present

## 2019-06-28 DIAGNOSIS — Z1331 Encounter for screening for depression: Secondary | ICD-10-CM | POA: Diagnosis not present

## 2019-06-28 DIAGNOSIS — F330 Major depressive disorder, recurrent, mild: Secondary | ICD-10-CM | POA: Diagnosis not present

## 2019-06-28 DIAGNOSIS — G3184 Mild cognitive impairment, so stated: Secondary | ICD-10-CM | POA: Diagnosis not present

## 2019-06-28 DIAGNOSIS — E669 Obesity, unspecified: Secondary | ICD-10-CM | POA: Diagnosis not present

## 2019-06-28 DIAGNOSIS — Z1339 Encounter for screening examination for other mental health and behavioral disorders: Secondary | ICD-10-CM | POA: Diagnosis not present

## 2019-06-28 DIAGNOSIS — R5383 Other fatigue: Secondary | ICD-10-CM | POA: Diagnosis not present

## 2019-06-28 DIAGNOSIS — Z0000 Encounter for general adult medical examination without abnormal findings: Secondary | ICD-10-CM | POA: Diagnosis not present

## 2019-06-28 DIAGNOSIS — E785 Hyperlipidemia, unspecified: Secondary | ICD-10-CM | POA: Diagnosis not present

## 2019-07-13 ENCOUNTER — Other Ambulatory Visit: Payer: Self-pay

## 2019-07-13 MED ORDER — RANOLAZINE ER 500 MG PO TB12: 500.0000 mg | ORAL_TABLET | Freq: Two times a day (BID) | ORAL | 4 refills | Status: DC

## 2019-07-14 ENCOUNTER — Other Ambulatory Visit: Payer: Self-pay | Admitting: Cardiology

## 2019-08-19 DIAGNOSIS — Z23 Encounter for immunization: Secondary | ICD-10-CM | POA: Diagnosis not present

## 2019-09-26 ENCOUNTER — Other Ambulatory Visit: Payer: Self-pay | Admitting: Cardiology

## 2019-10-31 DIAGNOSIS — H18832 Recurrent erosion of cornea, left eye: Secondary | ICD-10-CM | POA: Diagnosis not present

## 2019-10-31 DIAGNOSIS — H02054 Trichiasis without entropian left upper eyelid: Secondary | ICD-10-CM | POA: Diagnosis not present

## 2019-10-31 DIAGNOSIS — H5712 Ocular pain, left eye: Secondary | ICD-10-CM | POA: Diagnosis not present

## 2019-10-31 DIAGNOSIS — H04562 Stenosis of left lacrimal punctum: Secondary | ICD-10-CM | POA: Diagnosis not present

## 2019-10-31 IMAGING — MR MR LUMBAR SPINE W/O CM
4 of 5 series · 25 of 48 positions shown · non-contrast
Comparison: MRI dated 02/12/2007

CLINICAL DATA: Low back pain. Intermittent left thigh and left calf
pain. Previous lumbar surgery.

EXAM:
MRI LUMBAR SPINE WITHOUT CONTRAST
TECHNIQUE: Multiplanar, multisequence MR imaging of the lumbar spine was
performed. No intravenous contrast was administered.

[Series 3: T2 · sagittal · 4.0mm · 0.55mm/px · 6 of 17 slices shown (1 of 2)]
[im 1/17]
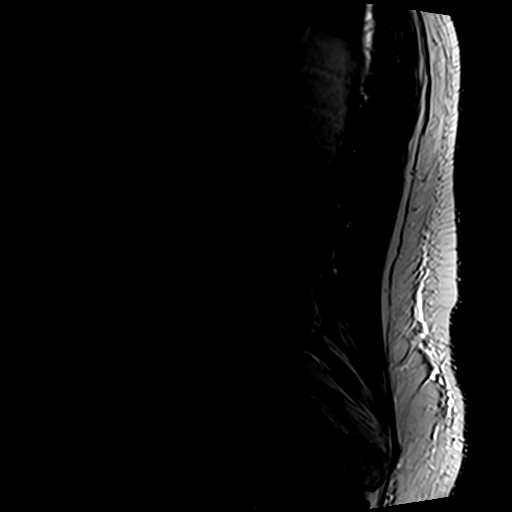
[im 4/17]
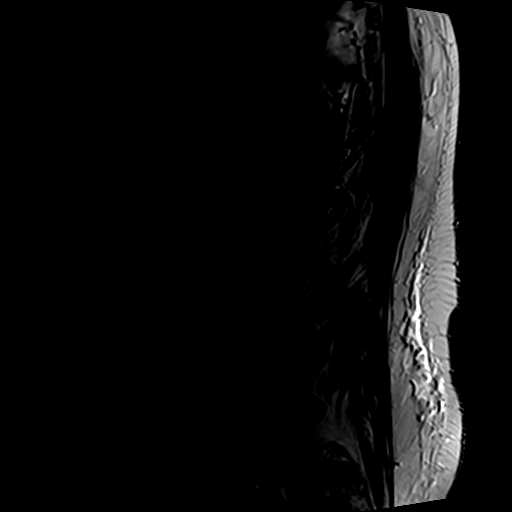
[im 7/17]
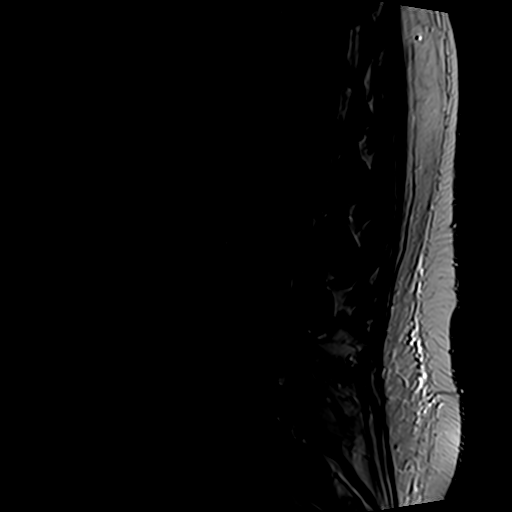
[im 10/17]
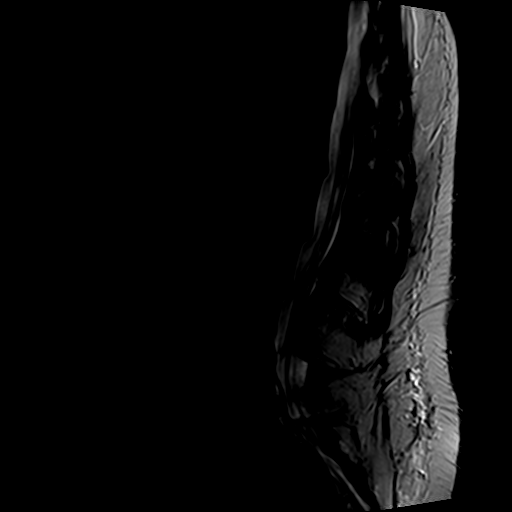
[im 13/17]
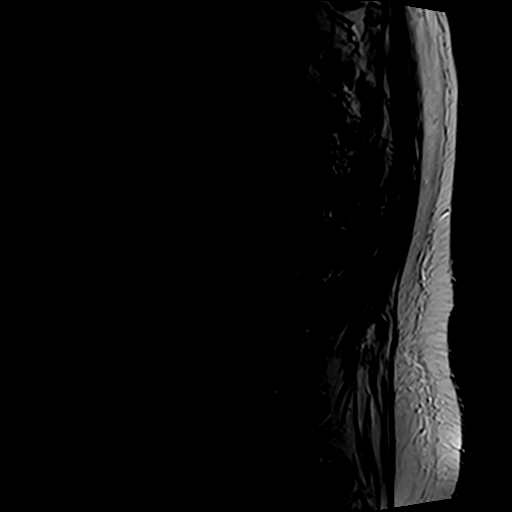
[im 17/17]
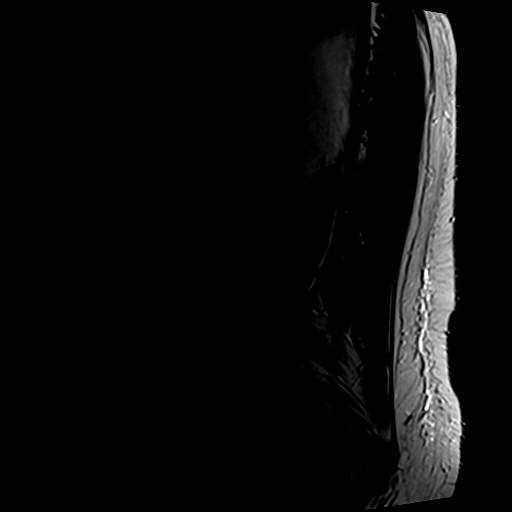

[Series 4: T1 · sagittal · 4.0mm · 0.55mm/px · 6 of 17 slices shown (1 of 2)]
[im 1/17]
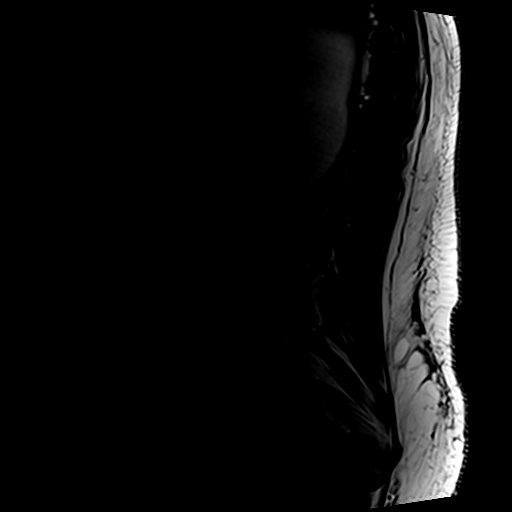
[im 4/17]
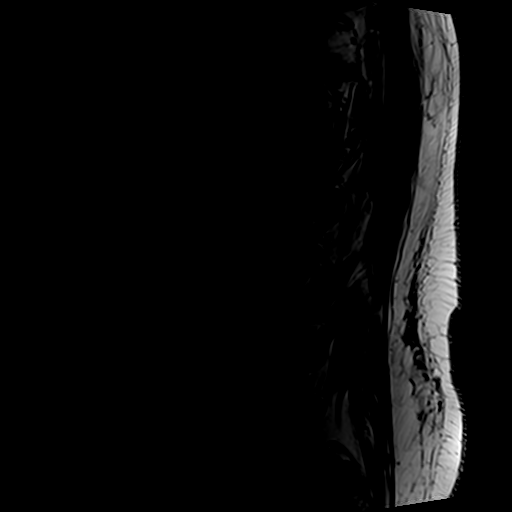
[im 7/17]
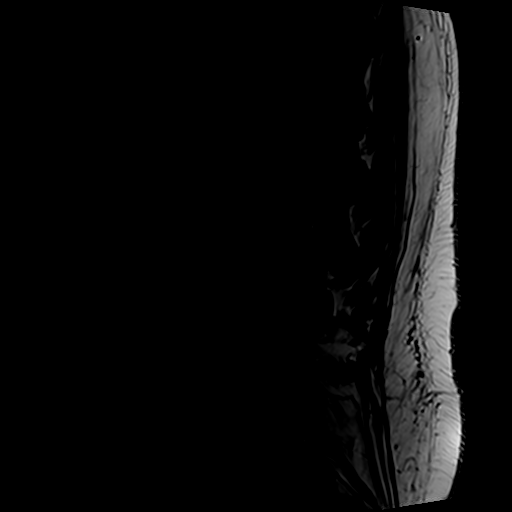
[im 10/17]
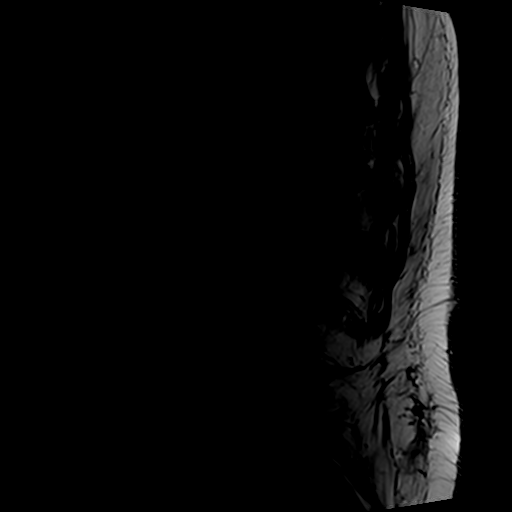
[im 13/17]
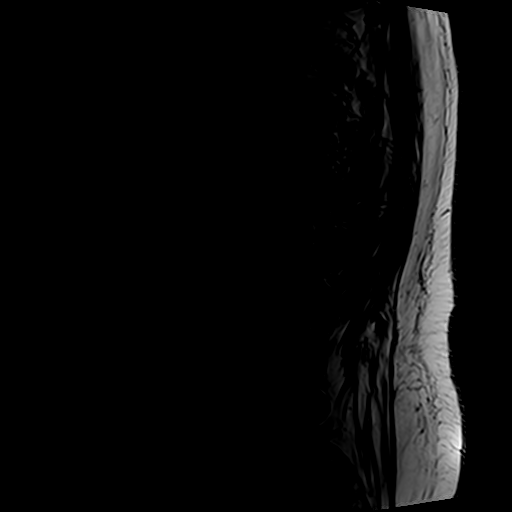
[im 17/17]
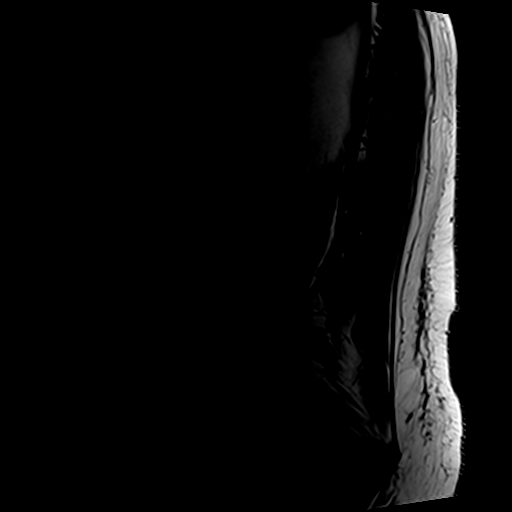

[Series 6: T2 · axial · 4.0mm · 0.70mm/px · z∈[-84,+153]mm · 9 of 45 slices shown (2 of 2)]
[im 1/45]
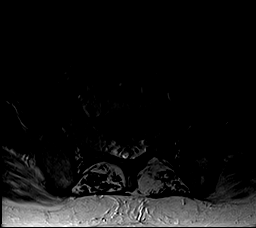
[im 7/45]
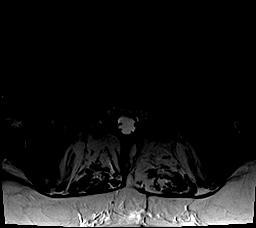
[im 13/45]
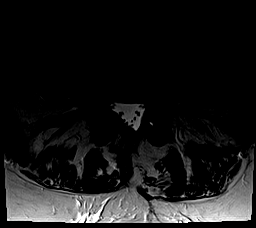
[im 19/45]
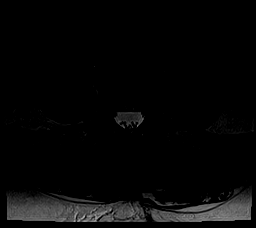
[im 23/45]
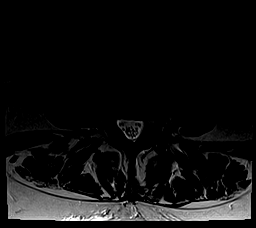
[im 26/45]
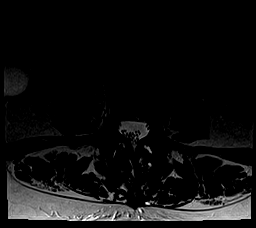
[im 32/45]
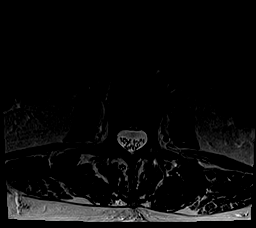
[im 38/45]
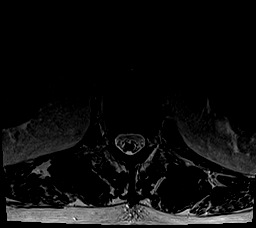
[im 45/45]
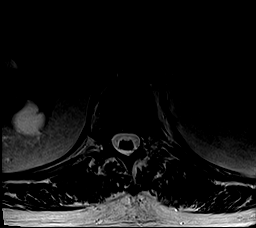

[Series 7: T1 · axial · 4.0mm · 0.35mm/px · z∈[-84,+118]mm · 4 of 45 slices shown (2 of 2)]
[im 1/45]
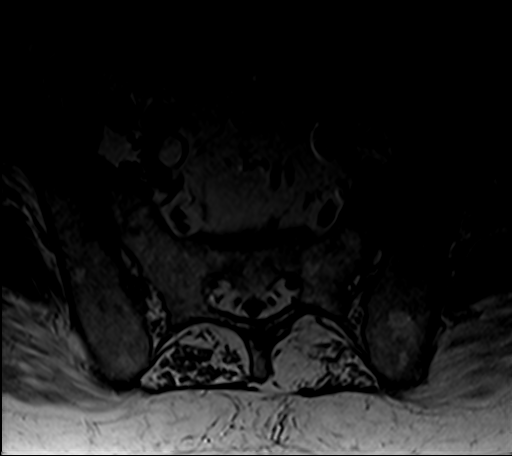
[im 7/45]
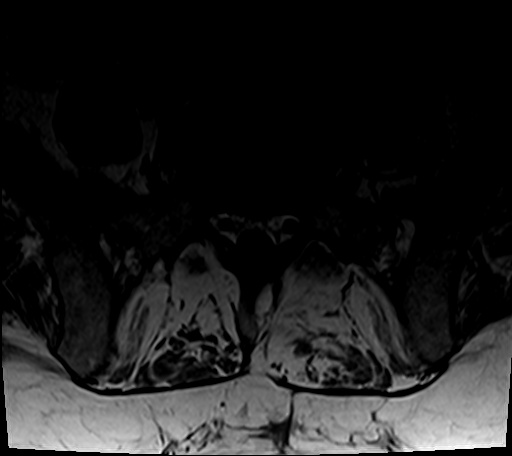
[im 23/45]
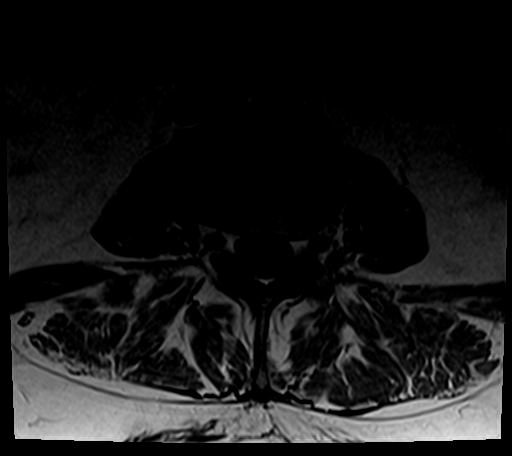
[im 38/45]
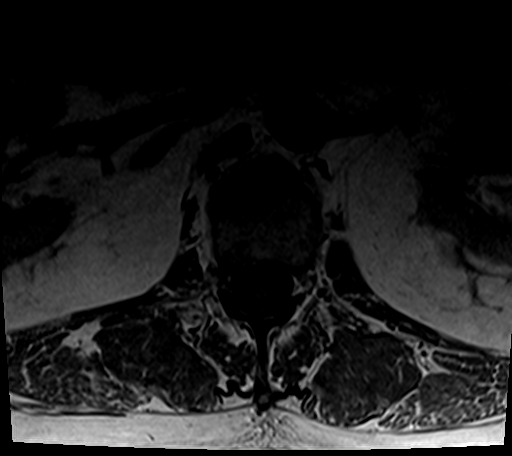

[25 of 48 positions shown; findings below may reference images not displayed]

FINDINGS: Segmentation:  Standard.

Alignment: Progressive slight retrolisthesis at L1-2, L2-3 and L3-4.

Vertebrae:  No fracture, evidence of discitis, or bone lesion.

Conus medullaris: Extends to the L2 level and appears normal.

Paraspinal and other soft tissues: No significant abnormality.
Benign renal cysts bilaterally.

Disc levels:

T12-L1:  Normal.

L1-2: Disc desiccation with disc space narrowing. 2 mm
retrolisthesis. Slight progression of small broad-based disc bulge
slightly asymmetric to the right without neural impingement. Slight
hypertrophy of the facet joints and ligamentum flavum. Minimal
narrowing of the spinal canal.

L2-3: 4 mm retrolisthesis with disc desiccation and disc space
narrowing, progressed since the prior study. Small progressed
broad-based disc bulge without neural impingement. Slight
hypertrophy of the ligamentum flavum.

L3-4: Progressive disc desiccation and disc space narrowing with
progressive now all 6 mm retrolisthesis. No significant bulging of
the uncovered disc. New moderate left foraminal stenosis. However,
the left L3 nerve exits without impingement. Progressive hypertrophy
of the ligamentum flavum facet joints. Minimal narrowing of the
spinal canal. Without focal neural impingement.

L4-5: Slight disc degeneration. Tiny disc bulge into the right
neural foramen with no neural impingement. Previous left
hemilaminotomy. Moderate bilateral facet arthritis, unchanged.

L5-S1: Chronic disc degeneration and disc space narrowing. Chronic
broad-based small disc protrusion with accompanying osteophytes
without focal neural impingement. Slight progression of left
moderate facet arthritis.
IMPRESSION: 1. Progressive degenerative disc disease at L1-2, L2-3 and L3-4 with
progressive slight retrolisthesis at each level but without neural
impingement or significant spinal or foraminal stenosis.
2. Stable postsurgical changes at L4-5.
3. Chronic facet arthritis, most prominent at L3-4 and L4-5 and on
the left at L5-S1.

## 2019-11-25 NOTE — Progress Notes (Signed)
CARDIOLOGY OFFICE NOTE  Date:  11/25/2019    [name] Date of Birth: 05/31/1936 Medical Record [id number]  PCP:  Marton Redwood, MD  Cardiologist:  Shariq Puig Martin[place]  MD  No chief complaint on file.   History of Present Illness: MANNY WOLLERT is a 83 y.o. male who presents for follow up post cardiac cath.  He has known CAD. In September of 2003 he underwent stenting of his first obtuse marginal vessel. He had a repeat cardiac catheterization in March 2010 after an abnormal stress Cardiolite. The cardiac catheterization showed long term patency of the prior stent and nonobstructive atherosclerotic disease elsewhere and he had normal left ventricular function. Seen as a work in on 01/25/14 because of recurring chest pain. He had LexiScan Myoview. This showed significant reversible anterolateral ischemia. On 02/10/14 the patient underwent cardiac catheterization with successful stenting of the second diagonal with a drug-eluting stent. He was seen in September 2016 complaining of atypical chest discomfort. Because of his atypical chest pain the patient underwent a myocardial perfusion scan 08/2015 which showed no ischemia and normal EF. It was felt to be a low risk study. Seen again in June 2017 with atypical chest pain and referred for cardiac cath which was unchanged. Other issues include HTN, HLD, GERD, carotid disease and obesity.   Seen in May 2018 of chest pain. Myoview study showed a small inferior and apical area of ischemia. EF lower at 47%. We recommended repeat cardiac cath. This showed nonobstructive CAD that was unchanged from prior studies. LV function was normal. EDP elevated 26-27 mm Hg. Medical management recommended. Since then he has been taking prilosec every other day and no more chest pain.[name]   He was admitted in Dec 2018 with syncope. He had been taking frequent Benadryl for rash and was also orthostatic. Cranial CT without acute abnormality. Medications adjusted.    In June 2020 he presented with chest pain. Cardiac catheterization was completed on 05/11/2019 revealing nonobstructive CAD unchanged from previous cath back in May 2018 with normal LV function and LVEDP.  He was to be continued on medical management with Plavix, statin therapy, beta-blocker, and ARB.  Ranexa was initiated at 500 mg twice daily for possible microvascular angina.  On follow up today he is doing well. [facility] with [facility].  He has only fleeting chest pain lasting a couple of seconds.  He states he still feels down in the dumps and is going to discuss his antidepressants with Dr Brigitte Pulse. He may have surgery for a tear duct next month.   Past Medical History:  Diagnosis Date  . Anginal pain ([place])   . Borderline diabetes    DIET CONTROLLED  . BPH (benign prostatic hypertrophy)    S/P TURP 1995  . Carotid artery disease ([place])    a. Carotid duplex in 05/2013: [id number] RICA, [id number] LICA. b. Per Patient, doppler 05/2015 showed 1-39% on the R - being folloewd by primary care.  . Cataract of right eye   . Coronary artery disease    a. 2003 s/p PCI/stenting; b. 02/2014 Abnl nuc -> Cath/PCI: LM nl, LAD nl, D2 95p (2.5x12 Promus DES), LCX <20, OM1 patent stent, RCA 50d, EF 55-65%. c. 08/2015: normal nuc.  . Depression   . Dyslipidemia    History of dyslipidemia  . GERD (gastroesophageal reflux disease)   . Hypertension   . Osteoarthritis    RIGHT ANKLE,  BILATERAL WRIST, BACK  . Reflux OCCASIONAL   TAKES PRILOSEC  . Sinus bradycardia     Past Surgical History:  Procedure Laterality Date  . BACK SURGERY    . CARDIAC CATHETERIZATION  02-06-09   NO INTERVENTION  . CARDIAC CATHETERIZATION N/A 05/22/2016   Procedure: Left Heart Cath and Coronary Angiography;  Surgeon: Aseem Sessums M Martinique, MD;  Location: [facility];  Service: Cardiovascular;  Laterality: N/A;  . CAROTID STENT    . CERVICAL DISCECTOMY  1989   C4 - 5  . CHOLECYSTECTOMY   [id number]   W/ UMBILICAL HERNIA REPAIR  . CORONARY ANGIOPLASTY  2003- FIRST OBTUSE MARGIANL VESSEL    X1 CYPHER STENT  . CORONARY STENT PLACEMENT     DES to first diagonal        DR Martinique  . EYE SURGERY    . HERNIA REPAIR  [id number]   Umbilical  . INGUINAL HERNIA REPAIR  1985  . KNEE ARTHROSCOPY  11/05/2011   Procedure: ARTHROSCOPY KNEE;  Surgeon: Gearlean Alf;  Location: [place] [place];  Service: Orthopedics;  Laterality: Right;  RIGHT ARTHROSCOPY KNEE WITH DEBRIDEMENT, Chondroplasty  . LEFT HEART CATH AND CORONARY ANGIOGRAPHY N/A 05/01/2017   Procedure: Left Heart Cath and Coronary Angiography;  Surgeon: Martinique, Talynn Lebon M, MD;  Location: [facility];  Service: Cardiovascular;  Laterality: N/A;  . LEFT HEART CATH AND CORONARY ANGIOGRAPHY N/A 05/11/2019   Procedure: LEFT HEART CATH AND CORONARY ANGIOGRAPHY;  Surgeon: Martinique, Daje Stark M, MD;  Location: [facility];  Service: Cardiovascular;  Laterality: N/A;  . LEFT HEART CATHETERIZATION WITH CORONARY ANGIOGRAM N/A 02/10/2014   Procedure: LEFT HEART CATHETERIZATION WITH CORONARY ANGIOGRAM;  Surgeon: Katheryne Gorr M [place], MD;  Location: [facility];  Service: Cardiovascular;  Laterality: N/A;  . LUMBAR LAMINECTOMY  1973   L4-S1  . PILONIDAL CYST EXCISION  1961  . SHOULDER ARTHROSCOPY     Bilateral  . TONSILLECTOMY AND ADENOIDECTOMY  CHILD  . TRANSURETHRAL RESECTION OF PROSTATE  1995     Medications: Current Outpatient Medications  Medication Sig Dispense Refill  . aspirin 81 MG tablet Take 81 mg by mouth daily.     [name] atorvastatin (LIPITOR) 40 MG tablet TAKE 1 TABLET BY MOUTH  DAILY 90 tablet 3  . buPROPion (WELLBUTRIN XL) 150 MG 24 hr tablet Take 150 mg by mouth daily.    . calcium carbonate (TUMS - DOSED IN MG ELEMENTAL CALCIUM) 500 MG chewable tablet Chew 1 tablet by mouth daily as needed for indigestion or heartburn.    . citalopram (CELEXA) 20 MG tablet Take 20 mg by mouth daily.    . clopidogrel (PLAVIX) 75 MG tablet Take  75 mg by mouth daily.     . furosemide (LASIX) 20 MG tablet Take 1 tablet (20 mg total) by mouth daily as needed for edema. 30 tablet 3  . Lidocaine, Anorectal, (RECTICARE EX) Apply 1 application topically daily as needed (ANKLE ITCHING).    [name] loratadine (CLARITIN) 10 MG tablet Take 10 mg by mouth daily as needed for allergies.    [name] losartan (COZAAR) 25 MG tablet TAKE 1 TABLET BY MOUTH ONCE DAILY 90 tablet 3  . metoprolol succinate (TOPROL-XL) 25 MG 24 hr tablet Take 12.5 mg by mouth daily.     . naproxen sodium (ANAPROX) 220 MG tablet Take 220 mg by mouth daily.    . nitroGLYCERIN (NITROSTAT) 0.4 MG SL tablet Place 0.4 mg under the tongue every 5 (  five) minutes as needed for chest pain (x 3 tablets daily).    . ranolazine (RANEXA) 500 MG 12 hr tablet Take 1 tablet (500 mg total) by mouth 2 (two) times daily. 120 tablet 4  . valACYclovir (VALTREX) 1000 MG tablet Take 1,000 mg by mouth daily as needed. Outbreak.    . zolpidem (AMBIEN) 5 MG tablet Take 2.5 mg by mouth at bedtime as needed for sleep.      No current facility-administered medications for this visit.    Allergies: Allergies  Allergen Reactions  . Flexeril [Cyclobenzaprine] Swelling  . Methocarbamol Swelling  . Orphenadrine Citrate Swelling  . Lisinopril Cough  . Ramipril Other (See Comments)    DRY COUGH    Social History: The patient  reports that he has never smoked. He has never used smokeless tobacco. He reports current alcohol use. He reports that he does not use drugs.   Family History: The patient's family history includes Hypertension in his mother; Lung cancer in his father.   Review of Systems: Please see the history of present illness.   Otherwise, the review of systems is positive for none.   All other systems are reviewed and negative.   Physical Exam: VS:  There were no vitals taken for this visit. [name]  BMI There is no height or weight on file to calculate BMI.  Wt Readings from Last 3 Encounters:    05/30/19 243 lb (110.2 kg)  05/12/19 240 lb 8 oz (109.1 kg)  12/09/18 243 lb (110.2 kg)    GENERAL:  Well appearing WM in NAD HEENT:  PERRL, EOMI, sclera are clear. Oropharynx is clear. NECK:  No jugular venous distention, carotid upstroke brisk and symmetric, no bruits, no thyromegaly or adenopathy LUNGS:  Clear to auscultation bilaterally CHEST:  Unremarkable HEART:  RRR,  PMI not displaced or sustained,S1 and S2 within normal limits, no S3, no S4: no clicks, no rubs, no murmurs ABD:  Soft, nontender. BS +, no masses or bruits. No hepatomegaly, no splenomegaly EXT:  2 + pulses throughout, no edema, no cyanosis no clubbing SKIN:  Warm and dry.  No rashes NEURO:  Alert and oriented x 3. Cranial nerves II through XII intact. PSYCH:  Cognitively intact     LABORATORY DATA:  EKG:  EKG is  not ordered today    Lab Results  Component Value Date   WBC 5.1 05/11/2019   HGB 15.5 05/11/2019   HCT 46.8 05/11/2019   PLT 178 05/11/2019   GLUCOSE 79 05/12/2019   CHOL 88 05/12/2019   TRIG 75 05/12/2019   HDL 34 (L) 05/12/2019   LDLCALC 39 05/12/2019   ALT 22 12/01/2017   AST 31 12/01/2017   NA 137 05/12/2019   K 4.0 05/12/2019   CL 107 05/12/2019   CREATININE 0.97 05/12/2019   BUN 13 05/12/2019   CO2 23 05/12/2019   INR 1.0 04/27/2017   Dated 06/17/18: cholesterol 97, triglycerides 62, HDL 43, LDL 42. A1c 5%. CBC, CMET and TSH normal.  Dated 06/21/19: cholesterol 108, triglycerides 63, HDL 43, LDL 52, A1c 4.8%. creatinine 1.2. otherwise CMET, CBC, TSH normal  BNP (last 3 results) No results for input(s): BNP in the last 8760 hours.  ProBNP (last 3 results) No results for input(s): PROBNP in the last 8760 hours.   Other Studies Reviewed Today:  Myoview: 04/23/17: Study Highlights     The left ventricular ejection fraction is mildly decreased (45-54%).  Nuclear stress EF: 47%.  There was no  ST segment deviation noted during stress.  Defect 1: There is a small  defect of moderate severity present in the mid inferior and apical inferior location.  Findings consistent with ischemia.  This is an intermediate risk study.   There is small area mild severity reversible defect in the mid and apical inferior walls (SDS =2). LVEF is mildly decreased at 47%   Cardiac cath: 05/01/17: Conclusion     Prox RCA lesion, 20 %stenosed.  Dist RCA lesion, 50 %stenosed.  1st Mrg lesion, 0 %stenosed.  1st Diag lesion, 0 %stenosed.  Ost 1st Mrg to 1st Mrg lesion, 20 %stenosed.  Prox Cx to Mid Cx lesion, 20 %stenosed.  The left ventricular systolic function is normal.  LV end diastolic pressure is moderately elevated.  The left ventricular ejection fraction is 55-65% by visual estimate.   1. Nonobstructive CAD 2. Normal LV function 3. Elevated LVEDP  Plan: compared to last angiogram there is no significant change. Will continue medical therapy and consider other causes of chest pain.     Cath: 05/11/19   Non-stenotic 1st Diag lesion was previously treated.  Prox Cx to Mid Cx lesion is 20% stenosed.  Non-stenotic 1st Mrg lesion was previously treated.  Ost 1st Mrg to 1st Mrg lesion is 20% stenosed.  Prox RCA lesion is 20% stenosed.  Dist RCA lesion is 50% stenosed.  The left ventricular systolic function is normal.  LV end diastolic pressure is normal.  The left ventricular ejection fraction is 55-65% by visual estimate.  1. Nonobstructive CAD- unchanged from May 2018 2. Normal LV function 3. Normal LVEDP  Plan: no new findings to explain his chest pain. Continue medical therapy  Echo 05/12/19: IMPRESSIONS    1. The left ventricle has normal systolic function with an ejection fraction of 60-65%. The cavity size was normal. There is moderate concentric left ventricular hypertrophy. Left ventricular diastolic Doppler parameters are consistent with impaired  relaxation.  2. The right ventricle has normal systolic function. The  cavity was normal. There is no increase in right ventricular wall thickness.  3. Mild thickening of the mitral valve leaflet.  4. The aortic valve is tricuspid. Moderate thickening of the aortic valve. Moderate calcification of the aortic valve. Aortic valve regurgitation is mild by color flow Doppler. Mild stenosis of the aortic valve.d  Assessment/Plan: 1. CAD with first stent in the 1st OM back in 2003; DES to 2nd DX in March 2015. Negative Myoview from 08/2015.  Last myoview suggested an area of inferior apical ischemia. Subsequent cardiac cath showed nonobstructive disease with stable findings.  Last cardiac cath in June 2020 was unchanged.  He has no significant angina now. If surgery is needed he is cleared from my standpoint and may hold Plavix for the procedure.   2. HTN - BP is well controlled on his current therapy.   3. HLD - on high dose statin. Last LDL 52. At goal  4. Carotid disease with  stable doppler study in July 2017  5. Obesity/deconditioning - encourage increased aerobic activity. He has lost weight.    Follow up one year    No orders of the defined types were placed in this encounter.   Signed: Teniqua Marron Martinique MD, [facility]  11/25/2019 7:44 AM  [facility]

## 2019-11-28 ENCOUNTER — Ambulatory Visit (INDEPENDENT_AMBULATORY_CARE_PROVIDER_SITE_OTHER): Payer: Medicare Other | Admitting: Cardiology

## 2019-11-28 ENCOUNTER — Other Ambulatory Visit: Payer: Self-pay

## 2019-11-28 ENCOUNTER — Encounter: Payer: Self-pay | Admitting: Cardiology

## 2019-11-28 VITALS — BP 136/76 | HR 64 | Ht 76.0 in | Wt 236.0 lb

## 2019-11-28 DIAGNOSIS — I200 Unstable angina: Secondary | ICD-10-CM | POA: Diagnosis not present

## 2019-11-28 DIAGNOSIS — I10 Essential (primary) hypertension: Secondary | ICD-10-CM | POA: Diagnosis not present

## 2019-11-28 DIAGNOSIS — I25119 Atherosclerotic heart disease of native coronary artery with unspecified angina pectoris: Secondary | ICD-10-CM

## 2019-11-28 DIAGNOSIS — E7800 Pure hypercholesterolemia, unspecified: Secondary | ICD-10-CM

## 2020-04-10 DIAGNOSIS — H04563 Stenosis of bilateral lacrimal punctum: Secondary | ICD-10-CM | POA: Diagnosis not present

## 2020-04-10 DIAGNOSIS — H04222 Epiphora due to insufficient drainage, left lacrimal gland: Secondary | ICD-10-CM | POA: Diagnosis not present

## 2020-04-10 DIAGNOSIS — H5202 Hypermetropia, left eye: Secondary | ICD-10-CM | POA: Diagnosis not present

## 2020-05-01 ENCOUNTER — Other Ambulatory Visit: Payer: Self-pay

## 2020-05-01 DIAGNOSIS — E785 Hyperlipidemia, unspecified: Secondary | ICD-10-CM | POA: Diagnosis not present

## 2020-05-01 DIAGNOSIS — R5383 Other fatigue: Secondary | ICD-10-CM | POA: Diagnosis not present

## 2020-05-01 DIAGNOSIS — F3342 Major depressive disorder, recurrent, in full remission: Secondary | ICD-10-CM | POA: Diagnosis not present

## 2020-05-01 DIAGNOSIS — G3184 Mild cognitive impairment, so stated: Secondary | ICD-10-CM | POA: Diagnosis not present

## 2020-05-01 DIAGNOSIS — I10 Essential (primary) hypertension: Secondary | ICD-10-CM | POA: Diagnosis not present

## 2020-05-01 DIAGNOSIS — Z1339 Encounter for screening examination for other mental health and behavioral disorders: Secondary | ICD-10-CM | POA: Diagnosis not present

## 2020-05-01 DIAGNOSIS — Z1331 Encounter for screening for depression: Secondary | ICD-10-CM | POA: Diagnosis not present

## 2020-05-01 DIAGNOSIS — R7989 Other specified abnormal findings of blood chemistry: Secondary | ICD-10-CM | POA: Diagnosis not present

## 2020-05-01 MED ORDER — RANOLAZINE ER 500 MG PO TB12: 500.0000 mg | ORAL_TABLET | Freq: Two times a day (BID) | ORAL | 2 refills | Status: DC

## 2020-05-02 ENCOUNTER — Other Ambulatory Visit: Payer: Self-pay | Admitting: Internal Medicine

## 2020-05-02 DIAGNOSIS — G3184 Mild cognitive impairment, so stated: Secondary | ICD-10-CM

## 2020-05-03 DIAGNOSIS — R5383 Other fatigue: Secondary | ICD-10-CM | POA: Diagnosis not present

## 2020-06-04 ENCOUNTER — Ambulatory Visit
Admission: RE | Admit: 2020-06-04 | Discharge: 2020-06-04 | Disposition: A | Discharge status: Home / Self Care | Payer: Medicare Other | Source: Ambulatory Visit | Attending: Internal Medicine | Admitting: Internal Medicine

## 2020-06-04 ENCOUNTER — Other Ambulatory Visit: Payer: Self-pay

## 2020-06-04 DIAGNOSIS — G3184 Mild cognitive impairment, so stated: Secondary | ICD-10-CM

## 2020-06-04 MED ORDER — GADOBENATE DIMEGLUMINE 529 MG/ML IV SOLN
20.0000 mL | Freq: Once | INTRAVENOUS | Status: AC | PRN
  Administered 2020-06-04: 20 mL via INTRAVENOUS

## 2020-07-10 DIAGNOSIS — E7849 Other hyperlipidemia: Secondary | ICD-10-CM | POA: Diagnosis not present

## 2020-07-10 DIAGNOSIS — E538 Deficiency of other specified B group vitamins: Secondary | ICD-10-CM | POA: Diagnosis not present

## 2020-07-10 DIAGNOSIS — R7301 Impaired fasting glucose: Secondary | ICD-10-CM | POA: Diagnosis not present

## 2020-07-10 DIAGNOSIS — Z125 Encounter for screening for malignant neoplasm of prostate: Secondary | ICD-10-CM | POA: Diagnosis not present

## 2020-07-17 DIAGNOSIS — E538 Deficiency of other specified B group vitamins: Secondary | ICD-10-CM | POA: Diagnosis not present

## 2020-07-17 DIAGNOSIS — I2510 Atherosclerotic heart disease of native coronary artery without angina pectoris: Secondary | ICD-10-CM | POA: Diagnosis not present

## 2020-07-17 DIAGNOSIS — R002 Palpitations: Secondary | ICD-10-CM | POA: Diagnosis not present

## 2020-07-17 DIAGNOSIS — R7301 Impaired fasting glucose: Secondary | ICD-10-CM | POA: Diagnosis not present

## 2020-07-17 DIAGNOSIS — I10 Essential (primary) hypertension: Secondary | ICD-10-CM | POA: Diagnosis not present

## 2020-07-17 DIAGNOSIS — G3184 Mild cognitive impairment, so stated: Secondary | ICD-10-CM | POA: Diagnosis not present

## 2020-07-17 DIAGNOSIS — E785 Hyperlipidemia, unspecified: Secondary | ICD-10-CM | POA: Diagnosis not present

## 2020-07-17 DIAGNOSIS — F3342 Major depressive disorder, recurrent, in full remission: Secondary | ICD-10-CM | POA: Diagnosis not present

## 2020-07-17 DIAGNOSIS — I6529 Occlusion and stenosis of unspecified carotid artery: Secondary | ICD-10-CM | POA: Diagnosis not present

## 2020-07-17 DIAGNOSIS — R82998 Other abnormal findings in urine: Secondary | ICD-10-CM | POA: Diagnosis not present

## 2020-07-17 DIAGNOSIS — Z0000 Encounter for general adult medical examination without abnormal findings: Secondary | ICD-10-CM | POA: Diagnosis not present

## 2020-07-29 NOTE — Progress Notes (Signed)
CARDIOLOGY OFFICE NOTE  Date:  08/02/2020    Jared Clark Date of Birth: Apr 12, 1936 Medical Record #[id number]  PCP:  Marton Redwood, MD  Cardiologist:  Seleena Reimers [place]  MD  Chief Complaint  Patient presents with  . Coronary Artery Disease    History of Present Illness: Jared Clark is a 84 y.o. male who is seen for follow up CAD. Other issues include HTN, HLD, GERD, carotid disease and obesity.   In September of 2003 he underwent stenting of his first obtuse marginal vessel. He had a repeat cardiac catheterization in March 2010 after an abnormal stress Cardiolite. The cardiac catheterization showed long term patency of the prior stent and nonobstructive atherosclerotic disease elsewhere and he had normal left ventricular function.   Seen as a work in on 01/25/14 because of recurring chest pain. He had LexiScan Myoview. This showed significant reversible anterolateral ischemia. On 02/10/14 the patient underwent cardiac catheterization with successful stenting of the second diagonal with a drug-eluting stent. He was seen in September 2016 complaining of atypical chest discomfort. Because of his atypical chest pain the patient underwent a myocardial perfusion scan 08/2015 which showed no ischemia and normal EF. It was felt to be a low risk study. Seen again in June 2017 with atypical chest pain and referred for cardiac cath which was unchanged.   Seen in May 2018 of chest pain. Myoview study showed a small inferior and apical area of ischemia. EF lower at 47%. We recommended repeat cardiac cath. This showed nonobstructive CAD that was unchanged from prior studies. LV function was normal. EDP elevated 26-27 mm Hg.   In June 2020 he presented with chest pain. Cardiac catheterization was completed on 05/11/2019 revealing nonobstructive CAD unchanged from previous cath back in May 2018 with normal LV function and LVEDP.  He was to be continued on medical management with Plavix, statin  therapy, beta-blocker, and ARB.  Ranexa was initiated at 500 mg twice daily for possible microvascular angina.  On follow up today he is doing well. He is still quite sedentary but is thinking about joining a gym with a track so he can walk when it is hot. He has a daily pain in his left breast. Comes and goes. Is sensitive to touch. Never more than 2/10. No dyspnea.   Past Medical History:  Diagnosis Date  . Anginal pain ([place])   . Borderline diabetes    DIET CONTROLLED  . BPH (benign prostatic hypertrophy)    S/P TURP 1995  . Carotid artery disease ([place])    a. Carotid duplex in 05/2013: 40-34% RICA, <74% LICA. b. Per Patient, doppler 05/2015 showed 1-39% on the R - being folloewd by primary care.  . Cataract of right eye   . Coronary artery disease    a. 2003 s/p PCI/stenting; b. 02/2014 Abnl nuc -> Cath/PCI: LM nl, LAD nl, D2 95p (2.5x12 Promus DES), LCX <20, OM1 patent stent, RCA 50d, EF 55-65%. c. 08/2015: normal nuc.  . Depression   . Dyslipidemia    History of dyslipidemia  . GERD (gastroesophageal reflux disease)   . Hypertension   . Osteoarthritis    RIGHT ANKLE, BILATERAL WRIST, BACK  . Reflux OCCASIONAL   TAKES PRILOSEC  . Sinus bradycardia     Past Surgical History:  Procedure Laterality Date  . BACK SURGERY    . CARDIAC CATHETERIZATION  02-06-09   NO INTERVENTION  . CARDIAC CATHETERIZATION N/A 05/22/2016   Procedure: Left Heart Cath and  Coronary Angiography;  Surgeon: Kiah Vanalstine M Martinique, MD;  Location: [facility];  Service: Cardiovascular;  Laterality: N/A;  . CAROTID STENT    . CERVICAL DISCECTOMY  1989   C4 - 5  . CHOLECYSTECTOMY  7829   W/ UMBILICAL HERNIA REPAIR  . CORONARY ANGIOPLASTY  2003- FIRST OBTUSE MARGIANL VESSEL    X1 CYPHER STENT  . CORONARY STENT PLACEMENT     DES to first diagonal        DR Martinique  . EYE SURGERY    . HERNIA REPAIR  5621   Umbilical  . INGUINAL HERNIA REPAIR  1985  . KNEE ARTHROSCOPY  11/05/2011   Procedure: ARTHROSCOPY KNEE;   Surgeon: Gearlean Alf;  Location: [facility];  Service: Orthopedics;  Laterality: Right;  RIGHT ARTHROSCOPY KNEE WITH DEBRIDEMENT, Chondroplasty  . LEFT HEART CATH AND CORONARY ANGIOGRAPHY N/A 05/01/2017   Procedure: Left Heart Cath and Coronary Angiography;  Surgeon: Martinique, Dajanae Brophy M, MD;  Location: [facility];  Service: Cardiovascular;  Laterality: N/A;  . LEFT HEART CATH AND CORONARY ANGIOGRAPHY N/A 05/11/2019   Procedure: LEFT HEART CATH AND CORONARY ANGIOGRAPHY;  Surgeon: Martinique, Esteban Kobashigawa M, MD;  Location: [facility];  Service: Cardiovascular;  Laterality: N/A;  . LEFT HEART CATHETERIZATION WITH CORONARY ANGIOGRAM N/A 02/10/2014   Procedure: LEFT HEART CATHETERIZATION WITH CORONARY ANGIOGRAM;  Surgeon: Alta Shober M Martin[place], MD;  Location: [facility];  Service: Cardiovascular;  Laterality: N/A;  . LUMBAR LAMINECTOMY  1973   L4-S1  . PILONIDAL CYST EXCISION  1961  . SHOULDER ARTHROSCOPY     Bilateral  . TONSILLECTOMY AND ADENOIDECTOMY  CHILD  . TRANSURETHRAL RESECTION OF PROSTATE  1995     Medications: Current Outpatient Medications  Medication Sig Dispense Refill  . aspirin 81 MG tablet Take 81 mg by mouth daily.     [name] atorvastatin (LIPITOR) 40 MG tablet TAKE 1 TABLET BY MOUTH  DAILY 90 tablet 3  . buPROPion (WELLBUTRIN XL) 150 MG 24 hr tablet Take 150 mg by mouth daily.    . calcium carbonate (TUMS - DOSED IN MG ELEMENTAL CALCIUM) 500 MG chewable tablet Chew 1 tablet by mouth daily as needed for indigestion or heartburn.    . citalopram (CELEXA) 20 MG tablet Take 20 mg by mouth daily.    . clopidogrel (PLAVIX) 75 MG tablet Take 75 mg by mouth daily.     . furosemide (LASIX) 20 MG tablet Take 1 tablet (20 mg total) by mouth daily as needed for edema. 30 tablet 3  . Lidocaine, Anorectal, (RECTICARE EX) Apply 1 application topically daily as needed (ANKLE ITCHING).    [name] loratadine (CLARITIN) 10 MG tablet Take 10 mg by mouth daily as needed for allergies.    [name]  losartan (COZAAR) 25 MG tablet TAKE 1 TABLET BY MOUTH ONCE DAILY 90 tablet 1  . metoprolol succinate (TOPROL-XL) 25 MG 24 hr tablet Take 12.5 mg by mouth daily.     . naproxen sodium (ANAPROX) 220 MG tablet Take 220 mg by mouth daily.    . nitroGLYCERIN (NITROSTAT) 0.4 MG SL tablet Place 0.4 mg under the tongue every 5 (five) minutes as needed for chest pain (x 3 tablets daily).    . ranolazine (RANEXA) 500 MG 12 hr tablet Take 1 tablet (500 mg total) by mouth 2 (two) times daily. 120 tablet 2  . valACYclovir (VALTREX) 1000 MG tablet Take 1,000 mg by mouth daily as needed. Outbreak.    [name]  zolpidem (AMBIEN) 5 MG tablet Take 2.5 mg by mouth at bedtime as needed for sleep.      No current facility-administered medications for this visit.    Allergies: Allergies  Allergen Reactions  . Flexeril [Cyclobenzaprine] Swelling  . Methocarbamol Swelling  . Orphenadrine Citrate Swelling  . Lisinopril Cough  . Ramipril Other (See Comments)    DRY COUGH    Social History: The patient  reports that he has never smoked. He has never used smokeless tobacco. He reports current alcohol use. He reports that he does not use drugs.   Family History: The patient's family history includes Hypertension in his mother; Lung cancer in his father.   Review of Systems: Please see the history of present illness.   Otherwise, the review of systems is positive for none.   All other systems are reviewed and negative.   Physical Exam: VS:  BP 98/60   Pulse 61   Ht 6\' 4"  (1.93 m)   Wt 230 lb (104.3 kg)   SpO2 99%   BMI 28.00 kg/m  .  BMI Body mass index is 28 kg/m.  Wt Readings from Last 3 Encounters:  08/02/20 230 lb (104.3 kg)  11/28/19 236 lb (107 kg)  05/30/19 243 lb (110.2 kg)    GENERAL:  Well appearing WM in NAD HEENT:  PERRL, EOMI, sclera are clear. Oropharynx is clear. NECK:  No jugular venous distention, carotid upstroke brisk and symmetric, no bruits, no thyromegaly or adenopathy LUNGS:  Clear  to auscultation bilaterally CHEST:  Unremarkable HEART:  RRR,  PMI not displaced or sustained,S1 and S2 within normal limits, no S3, no S4: no clicks, no rubs, no murmurs ABD:  Soft, nontender. BS +, no masses or bruits. No hepatomegaly, no splenomegaly EXT:  2 + pulses throughout, no edema, no cyanosis no clubbing SKIN:  Warm and dry.  No rashes NEURO:  Alert and oriented x 3. Cranial nerves II through XII intact. PSYCH:  Cognitively intact     LABORATORY DATA:  EKG:  EKG is ordered today. NSR rate 61. LAD, LVH with QRS widening and repolarization abnormality. I have personally reviewed and interpreted this study.     Lab Results  Component Value Date   WBC 5.1 05/11/2019   HGB 15.5 05/11/2019   HCT 46.8 05/11/2019   PLT 178 05/11/2019   GLUCOSE 79 05/12/2019   CHOL 88 05/12/2019   TRIG 75 05/12/2019   HDL 34 (L) 05/12/2019   LDLCALC 39 05/12/2019   ALT 22 12/01/2017   AST 31 12/01/2017   NA 137 05/12/2019   K 4.0 05/12/2019   CL 107 05/12/2019   CREATININE 0.97 05/12/2019   BUN 13 05/12/2019   CO2 23 05/12/2019   INR 1.0 04/27/2017   Dated 06/17/18: cholesterol 97, triglycerides 62, HDL 43, LDL 42. A1c 5%. CBC, CMET and TSH normal.  Dated 06/21/19: cholesterol 108, triglycerides 63, HDL 43, LDL 52, A1c 4.8%. creatinine 1.2. otherwise CMET, CBC, TSH normal Dated 07/10/20: cholesterol 102, triglycerides 67, HDL 43, LDL 46. A1c 4.9%. creatinine 1.3. otherwise CMET and CBC normal.   BNP (last 3 results) No results for input(s): BNP in the last 8760 hours.  ProBNP (last 3 results) No results for input(s): PROBNP in the last 8760 hours.   Other Studies Reviewed Today:  Myoview: 04/23/17: Study Highlights     The left ventricular ejection fraction is mildly decreased (45-54%).  Nuclear stress EF: 47%.  There was no ST segment deviation noted during  stress.  Defect 1: There is a small defect of moderate severity present in the mid inferior and apical inferior  location.  Findings consistent with ischemia.  This is an intermediate risk study.   There is small area mild severity reversible defect in the mid and apical inferior walls (SDS =2). LVEF is mildly decreased at 47%   Cardiac cath: 05/01/17: Conclusion     Prox RCA lesion, 20 %stenosed.  Dist RCA lesion, 50 %stenosed.  1st Mrg lesion, 0 %stenosed.  1st Diag lesion, 0 %stenosed.  Ost 1st Mrg to 1st Mrg lesion, 20 %stenosed.  Prox Cx to Mid Cx lesion, 20 %stenosed.  The left ventricular systolic function is normal.  LV end diastolic pressure is moderately elevated.  The left ventricular ejection fraction is 55-65% by visual estimate.   1. Nonobstructive CAD 2. Normal LV function 3. Elevated LVEDP  Plan: compared to last angiogram there is no significant change. Will continue medical therapy and consider other causes of chest pain.     Cath: 05/11/19   Non-stenotic 1st Diag lesion was previously treated.  Prox Cx to Mid Cx lesion is 20% stenosed.  Non-stenotic 1st Mrg lesion was previously treated.  Ost 1st Mrg to 1st Mrg lesion is 20% stenosed.  Prox RCA lesion is 20% stenosed.  Dist RCA lesion is 50% stenosed.  The left ventricular systolic function is normal.  LV end diastolic pressure is normal.  The left ventricular ejection fraction is 55-65% by visual estimate.  1. Nonobstructive CAD- unchanged from May 2018 2. Normal LV function 3. Normal LVEDP  Plan: no new findings to explain his chest pain. Continue medical therapy  Echo 05/12/19: IMPRESSIONS    1. The left ventricle has normal systolic function with an ejection fraction of 60-65%. The cavity size was normal. There is moderate concentric left ventricular hypertrophy. Left ventricular diastolic Doppler parameters are consistent with impaired  relaxation.  2. The right ventricle has normal systolic function. The cavity was normal. There is no increase in right ventricular wall  thickness.  3. Mild thickening of the mitral valve leaflet.  4. The aortic valve is tricuspid. Moderate thickening of the aortic valve. Moderate calcification of the aortic valve. Aortic valve regurgitation is mild by color flow Doppler. Mild stenosis of the aortic valve.d  Assessment/Plan: 1. CAD with first stent in the 1st OM back in 2003; DES to 2nd DX in March 2015. Negative Myoview from 08/2015.  Last myoview suggested an area of inferior apical ischemia. Subsequent cardiac cath showed nonobstructive disease with stable findings.  Last cardiac cath in June 2020 was unchanged.  He has stable atypical chest pain now. Will let me know if there is a significant change in symptoms.   2. HTN - BP is well controlled on his current therapy.   3. HLD - on high dose statin. Excellent control.   4. Carotid disease with  stable doppler study in July 2017  5. Obesity/deconditioning - encourage increased aerobic activity.   Follow up 6 months to stagger visits with Dr Brigitte Pulse.     No orders of the defined types were placed in this encounter.   Signed: Yoana Staib Martinique MD, [facility]  08/02/2020 12:21 PM  [place]

## 2020-07-31 ENCOUNTER — Other Ambulatory Visit: Payer: Self-pay | Admitting: Cardiology

## 2020-08-02 ENCOUNTER — Other Ambulatory Visit: Payer: Self-pay

## 2020-08-02 ENCOUNTER — Ambulatory Visit (INDEPENDENT_AMBULATORY_CARE_PROVIDER_SITE_OTHER): Payer: Medicare Other | Admitting: Cardiology

## 2020-08-02 ENCOUNTER — Encounter: Payer: Self-pay | Admitting: Cardiology

## 2020-08-02 VITALS — BP 98/60 | HR 61 | Ht 76.0 in | Wt 230.0 lb

## 2020-08-02 DIAGNOSIS — E785 Hyperlipidemia, unspecified: Secondary | ICD-10-CM

## 2020-08-02 DIAGNOSIS — I25119 Atherosclerotic heart disease of native coronary artery with unspecified angina pectoris: Secondary | ICD-10-CM | POA: Diagnosis not present

## 2020-08-02 DIAGNOSIS — E7800 Pure hypercholesterolemia, unspecified: Secondary | ICD-10-CM

## 2020-08-02 DIAGNOSIS — I10 Essential (primary) hypertension: Secondary | ICD-10-CM

## 2020-08-04 ENCOUNTER — Other Ambulatory Visit (HOSPITAL_COMMUNITY): Payer: Medicare Other

## 2020-08-07 DIAGNOSIS — E538 Deficiency of other specified B group vitamins: Secondary | ICD-10-CM | POA: Diagnosis not present

## 2020-08-14 DIAGNOSIS — H04223 Epiphora due to insufficient drainage, bilateral lacrimal glands: Secondary | ICD-10-CM | POA: Diagnosis not present

## 2020-08-14 DIAGNOSIS — H52203 Unspecified astigmatism, bilateral: Secondary | ICD-10-CM | POA: Diagnosis not present

## 2020-09-04 DIAGNOSIS — E538 Deficiency of other specified B group vitamins: Secondary | ICD-10-CM | POA: Diagnosis not present

## 2020-09-08 DIAGNOSIS — Z23 Encounter for immunization: Secondary | ICD-10-CM | POA: Diagnosis not present

## 2020-10-02 DIAGNOSIS — E538 Deficiency of other specified B group vitamins: Secondary | ICD-10-CM | POA: Diagnosis not present

## 2020-10-08 DIAGNOSIS — E785 Hyperlipidemia, unspecified: Secondary | ICD-10-CM | POA: Diagnosis not present

## 2020-10-08 DIAGNOSIS — I10 Essential (primary) hypertension: Secondary | ICD-10-CM | POA: Diagnosis not present

## 2020-10-08 DIAGNOSIS — G3184 Mild cognitive impairment, so stated: Secondary | ICD-10-CM | POA: Diagnosis not present

## 2020-10-08 DIAGNOSIS — R42 Dizziness and giddiness: Secondary | ICD-10-CM | POA: Diagnosis not present

## 2020-10-08 DIAGNOSIS — I2510 Atherosclerotic heart disease of native coronary artery without angina pectoris: Secondary | ICD-10-CM | POA: Diagnosis not present

## 2020-10-08 DIAGNOSIS — R002 Palpitations: Secondary | ICD-10-CM | POA: Diagnosis not present

## 2020-10-08 DIAGNOSIS — I6529 Occlusion and stenosis of unspecified carotid artery: Secondary | ICD-10-CM | POA: Diagnosis not present

## 2020-10-09 ENCOUNTER — Other Ambulatory Visit: Payer: Self-pay | Admitting: Internal Medicine

## 2020-10-09 ENCOUNTER — Ambulatory Visit
Admission: RE | Admit: 2020-10-09 | Discharge: 2020-10-09 | Disposition: A | Discharge status: Home / Self Care | Payer: Medicare Other | Source: Ambulatory Visit | Attending: Internal Medicine | Admitting: Internal Medicine

## 2020-10-09 DIAGNOSIS — R42 Dizziness and giddiness: Secondary | ICD-10-CM | POA: Diagnosis not present

## 2020-10-09 DIAGNOSIS — I672 Cerebral atherosclerosis: Secondary | ICD-10-CM | POA: Diagnosis not present

## 2020-10-18 ENCOUNTER — Other Ambulatory Visit: Payer: Self-pay | Admitting: Cardiology

## 2020-10-31 DIAGNOSIS — E538 Deficiency of other specified B group vitamins: Secondary | ICD-10-CM | POA: Diagnosis not present

## 2020-11-26 ENCOUNTER — Other Ambulatory Visit: Payer: Self-pay

## 2020-11-26 ENCOUNTER — Telehealth: Payer: Self-pay | Admitting: Cardiology

## 2020-11-26 MED ORDER — RANOLAZINE ER 500 MG PO TB12
500.0000 mg | ORAL_TABLET | Freq: Two times a day (BID) | ORAL | 2 refills | Status: DC
Start: 2020-11-26 — End: 2020-12-04

## 2020-11-26 NOTE — Telephone Encounter (Signed)
*  STAT* If patient is at the pharmacy, call can be transferred to refill team.   1. Which medications need to be refilled? (please list name of each medication and dose if known)  ranolazine (RANEXA) 500 MG 12 hr tablet  2. Which pharmacy/location (including street and city if local pharmacy) is medication to be sent to? [place], [place] - [address] AT [place]  3. Do they need a 30 day or 90 day supply? 120 day

## 2020-11-29 DIAGNOSIS — E538 Deficiency of other specified B group vitamins: Secondary | ICD-10-CM | POA: Diagnosis not present

## 2020-12-04 ENCOUNTER — Other Ambulatory Visit: Payer: Self-pay | Admitting: Cardiology

## 2020-12-26 DIAGNOSIS — E538 Deficiency of other specified B group vitamins: Secondary | ICD-10-CM | POA: Diagnosis not present

## 2021-01-12 NOTE — Progress Notes (Unsigned)
CARDIOLOGY OFFICE NOTE  Date:  01/16/2021    Jared Clark Date of Birth: 07-15-1936 Medical Record [id number]  PCP:  Marton Redwood, MD  Cardiologist:  Peter Martinique  MD  Chief Complaint  Patient presents with  . Coronary Artery Disease    History of Present Illness: Jared Clark is a 85 y.o. male who is seen for follow up CAD. Other issues include HTN, HLD, GERD, carotid disease and obesity.   In September of 2003 he underwent stenting of his first obtuse marginal vessel. He had a repeat cardiac catheterization in March 2010 after an abnormal stress Cardiolite. The cardiac catheterization showed long term patency of the prior stent and nonobstructive atherosclerotic disease elsewhere and he had normal left ventricular function.   Seen as a work in on 01/25/14 because of recurring chest pain. He had LexiScan Myoview. This showed significant reversible anterolateral ischemia. On 02/10/14 the patient underwent cardiac catheterization with successful stenting of the second diagonal with a drug-eluting stent. He was seen in September 2016 complaining of atypical chest discomfort. Because of his atypical chest pain the patient underwent a myocardial perfusion scan 08/2015 which showed no ischemia and normal EF. It was felt to be a low risk study. Seen again in June 2017 with atypical chest pain and referred for cardiac cath which was unchanged.   Seen in May 2018 of chest pain. Myoview study showed a small inferior and apical area of ischemia. EF lower at 47%. We recommended repeat cardiac cath. This showed nonobstructive CAD that was unchanged from prior studies. LV function was normal. EDP elevated 26-27 mm Hg.   In June 2020 he presented with chest pain. Cardiac catheterization was completed on 05/11/2019 revealing nonobstructive CAD unchanged from previous cath back in May 2018 with normal LV function and LVEDP.  He was to be continued on medical management with Plavix, statin therapy,  beta-blocker, and ARB.  Ranexa was initiated at 500 mg twice daily for possible microvascular angina. Apparently he never started on this.   On follow up today he is doing well. He is now living at [facility]. He is sedentary. He fell this past year at his [place] and suffered an orbital fracture. He states he has been diagnosed with mild Alzheimers. He states he really hasn't had any heart pain for the past year. He does have some pain in the left precordium that can be reproduced with palpation or is positional.    Past Medical History:  Diagnosis Date  . Anginal pain ([place])   . Borderline diabetes    DIET CONTROLLED  . BPH (benign prostatic hypertrophy)    S/P TURP 1995  . Carotid artery disease ([place])    a. Carotid duplex in 05/2013: [id number] RICA, [id number] LICA. b. Per Patient, doppler 05/2015 showed 1-39% on the R - being folloewd by primary care.  . Cataract of right eye   . Coronary artery disease    a. 2003 s/p PCI/stenting; b. 02/2014 Abnl nuc -> Cath/PCI: LM nl, LAD nl, D2 95p (2.5x12 Promus DES), LCX <20, OM1 patent stent, RCA 50d, EF 55-65%. c. 08/2015: normal nuc.  . Depression   . Dyslipidemia    History of dyslipidemia  . GERD (gastroesophageal reflux disease)   . Hypertension   . Osteoarthritis    RIGHT ANKLE, BILATERAL WRIST, BACK  . Reflux OCCASIONAL   TAKES PRILOSEC  . Sinus bradycardia     Past Surgical History:  Procedure Laterality Date  . BACK  SURGERY    . CARDIAC CATHETERIZATION  02-06-09   NO INTERVENTION  . CARDIAC CATHETERIZATION N/A 05/22/2016   Procedure: Left Heart Cath and Coronary Angiography;  Surgeon: Peter M Martinique, MD;  Location: [facility];  Service: Cardiovascular;  Laterality: N/A;  . CAROTID STENT    . CERVICAL DISCECTOMY  1989   C4 - 5  . CHOLECYSTECTOMY  1660   W/ UMBILICAL HERNIA REPAIR  . CORONARY ANGIOPLASTY  2003- FIRST OBTUSE MARGIANL VESSEL    X1 CYPHER STENT  . CORONARY STENT PLACEMENT     DES to first diagonal        DR  Martinique  . EYE SURGERY    . HERNIA REPAIR  6301   Umbilical  . INGUINAL HERNIA REPAIR  1985  . KNEE ARTHROSCOPY  11/05/2011   Procedure: ARTHROSCOPY KNEE;  Surgeon: Gearlean Alf;  Location: [facility];  Service: Orthopedics;  Laterality: Right;  RIGHT ARTHROSCOPY KNEE WITH DEBRIDEMENT, Chondroplasty  . LEFT HEART CATH AND CORONARY ANGIOGRAPHY N/A 05/01/2017   Procedure: Left Heart Cath and Coronary Angiography;  Surgeon: Martinique, Peter M, MD;  Location: [facility];  Service: Cardiovascular;  Laterality: N/A;  . LEFT HEART CATH AND CORONARY ANGIOGRAPHY N/A 05/11/2019   Procedure: LEFT HEART CATH AND CORONARY ANGIOGRAPHY;  Surgeon: Martinique, Peter M, MD;  Location: [facility];  Service: Cardiovascular;  Laterality: N/A;  . LEFT HEART CATHETERIZATION WITH CORONARY ANGIOGRAM N/A 02/10/2014   Procedure: LEFT HEART CATHETERIZATION WITH CORONARY ANGIOGRAM;  Surgeon: Peter M Martinique, MD;  Location[facility];  Service: Cardiovascular;  Laterality: N/A;  . LUMBAR LAMINECTOMY  1973   L4-S1  . PILONIDAL CYST EXCISION  1961  . SHOULDER ARTHROSCOPY     Bilateral  . TONSILLECTOMY AND ADENOIDECTOMY  CHILD  . TRANSURETHRAL RESECTION OF PROSTATE  1995     Medications: Current Outpatient Medications  Medication Sig Dispense Refill  . aspirin 81 MG tablet Take 81 mg by mouth daily.    [name] atorvastatin (LIPITOR) 40 MG tablet TAKE 1 TABLET BY MOUTH  DAILY 90 tablet 3  . buPROPion (WELLBUTRIN XL) 150 MG 24 hr tablet Take 150 mg by mouth daily.    . calcium carbonate (TUMS - DOSED IN MG ELEMENTAL CALCIUM) 500 MG chewable tablet Chew 1 tablet by mouth daily as needed for indigestion or heartburn.    . citalopram (CELEXA) 20 MG tablet Take 20 mg by mouth daily.    . clopidogrel (PLAVIX) 75 MG tablet Take 75 mg by mouth daily.    . furosemide (LASIX) 20 MG tablet Take 1 tablet (20 mg total) by mouth daily as needed for edema. 30 tablet 3  . Lidocaine, Anorectal, (RECTICARE EX) Apply 1  application topically daily as needed (ANKLE ITCHING).    [name] loratadine (CLARITIN) 10 MG tablet Take 10 mg by mouth daily as needed for allergies.    [name] losartan (COZAAR) 25 MG tablet TAKE 1 TABLET BY MOUTH ONCE DAILY 90 tablet 3  . meclizine (ANTIVERT) 25 MG tablet Take 25 mg by mouth 3 (three) times daily as needed.    . metoprolol succinate (TOPROL-XL) 25 MG 24 hr tablet Take 12.5 mg by mouth daily.     . naproxen sodium (ANAPROX) 220 MG tablet Take 220 mg by mouth daily.    . nitroGLYCERIN (NITROSTAT) 0.4 MG SL tablet Place 0.4 mg under the tongue every 5 (five) minutes as needed for chest pain (x 3 tablets daily).    [name]  valACYclovir (VALTREX) 1000 MG tablet Take 1,000 mg by mouth daily as needed. Outbreak.    . zolpidem (AMBIEN) 5 MG tablet Take 2.5 mg by mouth at bedtime as needed for sleep.      No current facility-administered medications for this visit.    Allergies: Allergies  Allergen Reactions  . Flexeril [Cyclobenzaprine] Swelling  . Methocarbamol Swelling  . Orphenadrine Citrate Swelling  . Lisinopril Cough  . Ramipril Other (See Comments)    DRY COUGH    Social History: The patient  reports that he has never smoked. He has never used smokeless tobacco. He reports current alcohol use. He reports that he does not use drugs.   Family History: The patient's family history includes Hypertension in his mother; Lung cancer in his father.   Review of Systems: Please see the history of present illness.   Otherwise, the review of systems is positive for none.   All other systems are reviewed and negative.   Physical Exam: VS:  BP (!) 142/82   Pulse (!) 58   Ht 6\' 4"  (1.93 m)   Wt 242 lb (109.8 kg)   BMI 29.46 kg/m  .  BMI Body mass index is 29.46 kg/m.  Wt Readings from Last 3 Encounters:  01/16/21 242 lb (109.8 kg)  08/02/20 230 lb (104.3 kg)  11/28/19 236 lb (107 kg)    GENERAL:  Well appearing WM in NAD HEENT:  PERRL, EOMI, sclera are clear. Oropharynx is  clear. NECK:  No jugular venous distention, carotid upstroke brisk and symmetric, no bruits, no thyromegaly or adenopathy LUNGS:  Clear to auscultation bilaterally CHEST:  Unremarkable HEART:  RRR,  PMI not displaced or sustained,S1 and S2 within normal limits, no S3, no S4: no clicks, no rubs, no murmurs ABD:  Soft, nontender. BS +, no masses or bruits. No hepatomegaly, no splenomegaly EXT:  2 + pulses throughout, no edema, no cyanosis no clubbing SKIN:  Warm and dry.  No rashes NEURO:  Alert and oriented x 3. Cranial nerves II through XII intact. PSYCH:  Cognitively intact     LABORATORY DATA:  EKG:  EKG is not ordered today.    Lab Results  Component Value Date   WBC 5.1 05/11/2019   HGB 15.5 05/11/2019   HCT 46.8 05/11/2019   PLT 178 05/11/2019   GLUCOSE 79 05/12/2019   CHOL 88 05/12/2019   TRIG 75 05/12/2019   HDL 34 (L) 05/12/2019   LDLCALC 39 05/12/2019   ALT 22 12/01/2017   AST 31 12/01/2017   NA 137 05/12/2019   K 4.0 05/12/2019   CL 107 05/12/2019   CREATININE 0.97 05/12/2019   BUN 13 05/12/2019   CO2 23 05/12/2019   INR 1.0 04/27/2017   Dated 06/17/18: cholesterol 97, triglycerides 62, HDL 43, LDL 42. A1c 5%. CBC, CMET and TSH normal.  Dated 06/21/19: cholesterol 108, triglycerides 63, HDL 43, LDL 52, A1c 4.8%. creatinine 1.2. otherwise CMET, CBC, TSH normal Dated 07/10/20: cholesterol 102, triglycerides 67, HDL 43, LDL 46. A1c 4.9%. creatinine 1.3. otherwise CMET and CBC normal.   BNP (last 3 results) No results for input(s): BNP in the last 8760 hours.  ProBNP (last 3 results) No results for input(s): PROBNP in the last 8760 hours.   Other Studies Reviewed Today:  Myoview: 04/23/17: Study Highlights     The left ventricular ejection fraction is mildly decreased (45-54%).  Nuclear stress EF: 47%.  There was no ST segment deviation noted during stress.  Defect  1: There is a small defect of moderate severity present in the mid inferior and apical  inferior location.  Findings consistent with ischemia.  This is an intermediate risk study.   There is small area mild severity reversible defect in the mid and apical inferior walls (SDS =2). LVEF is mildly decreased at 47%   Cardiac cath: 05/01/17: Conclusion     Prox RCA lesion, 20 %stenosed.  Dist RCA lesion, 50 %stenosed.  1st Mrg lesion, 0 %stenosed.  1st Diag lesion, 0 %stenosed.  Ost 1st Mrg to 1st Mrg lesion, 20 %stenosed.  Prox Cx to Mid Cx lesion, 20 %stenosed.  The left ventricular systolic function is normal.  LV end diastolic pressure is moderately elevated.  The left ventricular ejection fraction is 55-65% by visual estimate.   1. Nonobstructive CAD 2. Normal LV function 3. Elevated LVEDP  Plan: compared to last angiogram there is no significant change. Will continue medical therapy and consider other causes of chest pain.     Cath: 05/11/19   Non-stenotic 1st Diag lesion was previously treated.  Prox Cx to Mid Cx lesion is 20% stenosed.  Non-stenotic 1st Mrg lesion was previously treated.  Ost 1st Mrg to 1st Mrg lesion is 20% stenosed.  Prox RCA lesion is 20% stenosed.  Dist RCA lesion is 50% stenosed.  The left ventricular systolic function is normal.  LV end diastolic pressure is normal.  The left ventricular ejection fraction is 55-65% by visual estimate.  1. Nonobstructive CAD- unchanged from May 2018 2. Normal LV function 3. Normal LVEDP  Plan: no new findings to explain his chest pain. Continue medical therapy  Echo 05/12/19: IMPRESSIONS    1. The left ventricle has normal systolic function with an ejection fraction of 60-65%. The cavity size was normal. There is moderate concentric left ventricular hypertrophy. Left ventricular diastolic Doppler parameters are consistent with impaired  relaxation.  2. The right ventricle has normal systolic function. The cavity was normal. There is no increase in right ventricular wall  thickness.  3. Mild thickening of the mitral valve leaflet.  4. The aortic valve is tricuspid. Moderate thickening of the aortic valve. Moderate calcification of the aortic valve. Aortic valve regurgitation is mild by color flow Doppler. Mild stenosis of the aortic valve.d  Assessment/Plan: 1. CAD with first stent in the 1st OM back in 2003; DES to 2nd DX in March 2015. Negative Myoview from 08/2015.  Last myoview suggested an area of inferior apical ischemia. Subsequent cardiac cath showed nonobstructive disease with stable findings.  Last cardiac cath in June 2020 was unchanged.  He has some musculoskeletal pain but nothing that really sounds cardiac.   2. HTN - BP is well controlled on his current therapy.   3. HLD - on high dose statin. Excellent control. Labs followed by Dr Brigitte Pulse.   4. Carotid disease with  stable doppler study in July 2017  5. Obesity/deconditioning - encourage increased aerobic activity.  Follow up in one year.  No orders of the defined types were placed in this encounter.   Signed: Peter Martinique MD, [facility]  01/16/2021 10:25 AM  [place]

## 2021-01-16 ENCOUNTER — Encounter: Payer: Self-pay | Admitting: Cardiology

## 2021-01-16 ENCOUNTER — Other Ambulatory Visit: Payer: Self-pay

## 2021-01-16 ENCOUNTER — Ambulatory Visit (INDEPENDENT_AMBULATORY_CARE_PROVIDER_SITE_OTHER): Payer: Medicare Other | Admitting: Cardiology

## 2021-01-16 VITALS — BP 142/82 | HR 58 | Ht 76.0 in | Wt 242.0 lb

## 2021-01-16 DIAGNOSIS — I25119 Atherosclerotic heart disease of native coronary artery with unspecified angina pectoris: Secondary | ICD-10-CM | POA: Diagnosis not present

## 2021-01-16 DIAGNOSIS — E7800 Pure hypercholesterolemia, unspecified: Secondary | ICD-10-CM | POA: Diagnosis not present

## 2021-01-16 DIAGNOSIS — I10 Essential (primary) hypertension: Secondary | ICD-10-CM | POA: Diagnosis not present

## 2021-01-24 DIAGNOSIS — E538 Deficiency of other specified B group vitamins: Secondary | ICD-10-CM | POA: Diagnosis not present

## 2021-02-26 DIAGNOSIS — E538 Deficiency of other specified B group vitamins: Secondary | ICD-10-CM | POA: Diagnosis not present

## 2021-03-16 DIAGNOSIS — Z23 Encounter for immunization: Secondary | ICD-10-CM | POA: Diagnosis not present

## 2021-04-02 DIAGNOSIS — E538 Deficiency of other specified B group vitamins: Secondary | ICD-10-CM | POA: Diagnosis not present

## 2021-04-06 DIAGNOSIS — K219 Gastro-esophageal reflux disease without esophagitis: Secondary | ICD-10-CM | POA: Diagnosis not present

## 2021-04-06 DIAGNOSIS — I2510 Atherosclerotic heart disease of native coronary artery without angina pectoris: Secondary | ICD-10-CM | POA: Diagnosis not present

## 2021-04-06 DIAGNOSIS — E785 Hyperlipidemia, unspecified: Secondary | ICD-10-CM | POA: Diagnosis not present

## 2021-04-06 DIAGNOSIS — I10 Essential (primary) hypertension: Secondary | ICD-10-CM | POA: Diagnosis not present

## 2021-04-30 DIAGNOSIS — E538 Deficiency of other specified B group vitamins: Secondary | ICD-10-CM | POA: Diagnosis not present

## 2021-05-07 DIAGNOSIS — K219 Gastro-esophageal reflux disease without esophagitis: Secondary | ICD-10-CM | POA: Diagnosis not present

## 2021-05-07 DIAGNOSIS — I2510 Atherosclerotic heart disease of native coronary artery without angina pectoris: Secondary | ICD-10-CM | POA: Diagnosis not present

## 2021-05-07 DIAGNOSIS — I10 Essential (primary) hypertension: Secondary | ICD-10-CM | POA: Diagnosis not present

## 2021-05-07 DIAGNOSIS — E785 Hyperlipidemia, unspecified: Secondary | ICD-10-CM | POA: Diagnosis not present

## 2021-05-28 DIAGNOSIS — E538 Deficiency of other specified B group vitamins: Secondary | ICD-10-CM | POA: Diagnosis not present

## 2021-06-06 DIAGNOSIS — R7301 Impaired fasting glucose: Secondary | ICD-10-CM | POA: Diagnosis not present

## 2021-06-06 DIAGNOSIS — Z125 Encounter for screening for malignant neoplasm of prostate: Secondary | ICD-10-CM | POA: Diagnosis not present

## 2021-06-06 DIAGNOSIS — E785 Hyperlipidemia, unspecified: Secondary | ICD-10-CM | POA: Diagnosis not present

## 2021-06-06 DIAGNOSIS — E538 Deficiency of other specified B group vitamins: Secondary | ICD-10-CM | POA: Diagnosis not present

## 2021-06-13 DIAGNOSIS — H8110 Benign paroxysmal vertigo, unspecified ear: Secondary | ICD-10-CM | POA: Diagnosis not present

## 2021-06-13 DIAGNOSIS — G3184 Mild cognitive impairment, so stated: Secondary | ICD-10-CM | POA: Diagnosis not present

## 2021-06-13 DIAGNOSIS — Z1339 Encounter for screening examination for other mental health and behavioral disorders: Secondary | ICD-10-CM | POA: Diagnosis not present

## 2021-06-13 DIAGNOSIS — N1831 Chronic kidney disease, stage 3a: Secondary | ICD-10-CM | POA: Diagnosis not present

## 2021-06-13 DIAGNOSIS — R82998 Other abnormal findings in urine: Secondary | ICD-10-CM | POA: Diagnosis not present

## 2021-06-13 DIAGNOSIS — R5383 Other fatigue: Secondary | ICD-10-CM | POA: Diagnosis not present

## 2021-06-13 DIAGNOSIS — I129 Hypertensive chronic kidney disease with stage 1 through stage 4 chronic kidney disease, or unspecified chronic kidney disease: Secondary | ICD-10-CM | POA: Diagnosis not present

## 2021-06-13 DIAGNOSIS — F3342 Major depressive disorder, recurrent, in full remission: Secondary | ICD-10-CM | POA: Diagnosis not present

## 2021-06-13 DIAGNOSIS — I6529 Occlusion and stenosis of unspecified carotid artery: Secondary | ICD-10-CM | POA: Diagnosis not present

## 2021-06-13 DIAGNOSIS — Z1331 Encounter for screening for depression: Secondary | ICD-10-CM | POA: Diagnosis not present

## 2021-06-13 DIAGNOSIS — R7301 Impaired fasting glucose: Secondary | ICD-10-CM | POA: Diagnosis not present

## 2021-06-13 DIAGNOSIS — E785 Hyperlipidemia, unspecified: Secondary | ICD-10-CM | POA: Diagnosis not present

## 2021-06-13 DIAGNOSIS — I2510 Atherosclerotic heart disease of native coronary artery without angina pectoris: Secondary | ICD-10-CM | POA: Diagnosis not present

## 2021-06-13 DIAGNOSIS — Z0000 Encounter for general adult medical examination without abnormal findings: Secondary | ICD-10-CM | POA: Diagnosis not present

## 2021-06-13 DIAGNOSIS — M159 Polyosteoarthritis, unspecified: Secondary | ICD-10-CM | POA: Diagnosis not present

## 2021-06-19 ENCOUNTER — Other Ambulatory Visit: Payer: Self-pay

## 2021-06-19 ENCOUNTER — Ambulatory Visit (HOSPITAL_COMMUNITY)
Admission: RE | Admit: 2021-06-19 | Discharge: 2021-06-19 | Disposition: A | Discharge status: Home / Self Care | Payer: Medicare Other | Source: Ambulatory Visit | Attending: Internal Medicine | Admitting: Internal Medicine

## 2021-06-19 ENCOUNTER — Other Ambulatory Visit (HOSPITAL_COMMUNITY): Payer: Self-pay | Admitting: Cardiovascular Disease

## 2021-06-19 DIAGNOSIS — I779 Disorder of arteries and arterioles, unspecified: Secondary | ICD-10-CM

## 2021-06-24 DIAGNOSIS — R051 Acute cough: Secondary | ICD-10-CM | POA: Diagnosis not present

## 2021-06-24 DIAGNOSIS — J310 Chronic rhinitis: Secondary | ICD-10-CM | POA: Diagnosis not present

## 2021-06-24 DIAGNOSIS — G309 Alzheimer's disease, unspecified: Secondary | ICD-10-CM | POA: Diagnosis not present

## 2021-06-24 DIAGNOSIS — U071 COVID-19: Secondary | ICD-10-CM | POA: Diagnosis not present

## 2021-06-27 DIAGNOSIS — Z79899 Other long term (current) drug therapy: Secondary | ICD-10-CM | POA: Diagnosis not present

## 2021-08-07 DIAGNOSIS — E785 Hyperlipidemia, unspecified: Secondary | ICD-10-CM | POA: Diagnosis not present

## 2021-08-07 DIAGNOSIS — I10 Essential (primary) hypertension: Secondary | ICD-10-CM | POA: Diagnosis not present

## 2021-08-07 DIAGNOSIS — K219 Gastro-esophageal reflux disease without esophagitis: Secondary | ICD-10-CM | POA: Diagnosis not present

## 2021-08-07 DIAGNOSIS — I2510 Atherosclerotic heart disease of native coronary artery without angina pectoris: Secondary | ICD-10-CM | POA: Diagnosis not present

## 2021-09-03 DIAGNOSIS — H04223 Epiphora due to insufficient drainage, bilateral lacrimal glands: Secondary | ICD-10-CM | POA: Diagnosis not present

## 2021-09-03 DIAGNOSIS — Z961 Presence of intraocular lens: Secondary | ICD-10-CM | POA: Diagnosis not present

## 2021-09-03 DIAGNOSIS — H52203 Unspecified astigmatism, bilateral: Secondary | ICD-10-CM | POA: Diagnosis not present

## 2021-09-06 DIAGNOSIS — E785 Hyperlipidemia, unspecified: Secondary | ICD-10-CM | POA: Diagnosis not present

## 2021-09-06 DIAGNOSIS — K219 Gastro-esophageal reflux disease without esophagitis: Secondary | ICD-10-CM | POA: Diagnosis not present

## 2021-09-06 DIAGNOSIS — I10 Essential (primary) hypertension: Secondary | ICD-10-CM | POA: Diagnosis not present

## 2021-09-06 DIAGNOSIS — I2510 Atherosclerotic heart disease of native coronary artery without angina pectoris: Secondary | ICD-10-CM | POA: Diagnosis not present

## 2021-09-07 DIAGNOSIS — Z23 Encounter for immunization: Secondary | ICD-10-CM | POA: Diagnosis not present

## 2021-10-16 DIAGNOSIS — Z23 Encounter for immunization: Secondary | ICD-10-CM | POA: Diagnosis not present

## 2021-11-09 ENCOUNTER — Other Ambulatory Visit: Payer: Self-pay | Admitting: Cardiology

## 2021-12-08 ENCOUNTER — Other Ambulatory Visit: Payer: Self-pay | Admitting: Cardiology

## 2022-01-05 ENCOUNTER — Other Ambulatory Visit: Payer: Self-pay | Admitting: Cardiology

## 2022-01-11 NOTE — Progress Notes (Signed)
CARDIOLOGY OFFICE NOTE  Date:  01/13/2022    Jared Clark Date of Birth: [birth year] Medical Record #[id number]  PCP:  Ginger Organ., MD  Cardiologist:  Ayssa Bentivegna Martinique  MD  Chief Complaint  Patient presents with   Coronary Artery Disease    History of Present Illness: Jared Clark is a 86 y.o. male who is seen for follow up CAD. Other issues include HTN, HLD, GERD, carotid disease and obesity.   In September of 2003 he underwent stenting of his first obtuse marginal vessel. He had a repeat cardiac catheterization in March 2010 after an abnormal stress Cardiolite. The cardiac catheterization showed long term patency of the prior stent and nonobstructive atherosclerotic disease elsewhere and he had normal left ventricular function.   Seen as a work in on 01/25/14 because of recurring chest pain. He had LexiScan Myoview. This showed significant reversible anterolateral ischemia.  On 02/10/14 the patient underwent cardiac catheterization with successful stenting of the second diagonal with a drug-eluting stent. He was seen in September 2016 complaining of atypical chest discomfort. Because of his atypical chest pain the patient underwent a myocardial perfusion scan 08/2015 which showed no ischemia and normal EF. It was felt to be a low risk study.  Seen again in June 2017 with atypical chest pain and referred for cardiac cath which was unchanged.   Seen in May 2018 of chest pain. Myoview study showed a small inferior and apical area of ischemia. EF lower at 47%. We recommended repeat cardiac cath. This showed nonobstructive CAD that was unchanged from prior studies. LV function was normal. EDP elevated 26-27 mm Hg.   In June 2020 he presented with chest pain. Cardiac catheterization was completed on 05/11/2019 revealing nonobstructive CAD unchanged from previous cath back in May 2018 with normal LV function and LVEDP.  He was to be continued on medical management with Plavix, statin  therapy, beta-blocker, and ARB.  Ranexa was initiated at 500 mg twice daily for possible microvascular angina. Apparently he never started on this.   On follow up today he is doing well. He is now living at [facility]. He is sedentary. He fell this in November and injured his knee but no intervention needed. He states he has been diagnosed with mild Alzheimers. He states he really hasn't had any chest pain.   Past Medical History:  Diagnosis Date   Anginal pain ([place])    Borderline diabetes    DIET CONTROLLED   BPH (benign prostatic hypertrophy)    S/P TURP 1995   Carotid artery disease ([facility])    a. Carotid duplex in 05/2013: 78-93% RICA, <81% LICA. b. Per Patient, doppler 05/2015 showed 1-39% on the R - being folloewd by primary care.   Cataract of right eye    Coronary artery disease    a. 2003 s/p PCI/stenting; b. 02/2014 Abnl nuc -> Cath/PCI: LM nl, LAD nl, D2 95p (2.5x12 Promus DES), LCX <20, OM1 patent stent, RCA 50d, EF 55-65%. c. 08/2015: normal nuc.   Depression    Dyslipidemia    History of dyslipidemia   GERD (gastroesophageal reflux disease)    Hypertension    Osteoarthritis    RIGHT ANKLE, BILATERAL WRIST, BACK   Reflux OCCASIONAL   TAKES PRILOSEC   Sinus bradycardia     Past Surgical History:  Procedure Laterality Date   BACK SURGERY     CARDIAC CATHETERIZATION  02-06-09   NO INTERVENTION   CARDIAC CATHETERIZATION N/A 05/22/2016  Procedure: Left Heart Cath and Coronary Angiography;  Surgeon: Anjeli Casad M Martinique, MD;  Location: [facility];  Service: Cardiovascular;  Laterality: N/A;   CAROTID STENT     CERVICAL DISCECTOMY  1989   C4 - 5   CHOLECYSTECTOMY  0973   W/ UMBILICAL HERNIA REPAIR   CORONARY ANGIOPLASTY  2003- FIRST OBTUSE MARGIANL VESSEL    X1 CYPHER STENT   CORONARY STENT PLACEMENT     DES to first diagonal        DR Martinique   EYE SURGERY     HERNIA REPAIR  5329   Umbilical   INGUINAL HERNIA REPAIR  1985   KNEE ARTHROSCOPY  11/05/2011   Procedure:  ARTHROSCOPY KNEE;  Surgeon: Gearlean Alf;  Location: [facility];  Service: Orthopedics;  Laterality: Right;  RIGHT ARTHROSCOPY KNEE WITH DEBRIDEMENT, Chondroplasty   LEFT HEART CATH AND CORONARY ANGIOGRAPHY N/A 05/01/2017   Procedure: Left Heart Cath and Coronary Angiography;  Surgeon: Martinique, Derrel Moore M, MD;  Location: [facility];  Service: Cardiovascular;  Laterality: N/A;   LEFT HEART CATH AND CORONARY ANGIOGRAPHY N/A 05/11/2019   Procedure: LEFT HEART CATH AND CORONARY ANGIOGRAPHY;  Surgeon: Martinique, Jina Olenick M, MD;  Location: [facility];  Service: Cardiovascular;  Laterality: N/A;   LEFT HEART CATHETERIZATION WITH CORONARY ANGIOGRAM N/A 02/10/2014   Procedure: LEFT HEART CATHETERIZATION WITH CORONARY ANGIOGRAM;  Surgeon: Lynora Dymond M Martinique, MD;  Location: [facility];  Service: Cardiovascular;  Laterality: N/A;   LUMBAR LAMINECTOMY  1973   L4-S1   PILONIDAL CYST EXCISION  1961   SHOULDER ARTHROSCOPY     Bilateral   TONSILLECTOMY AND ADENOIDECTOMY  CHILD   TRANSURETHRAL RESECTION OF PROSTATE  1995     Medications: Current Outpatient Medications  Medication Sig Dispense Refill   aspirin 81 MG tablet Take 81 mg by mouth daily.     atorvastatin (LIPITOR) 40 MG tablet TAKE 1 TABLET BY MOUTH  DAILY 90 tablet 1   calcium carbonate (TUMS - DOSED IN MG ELEMENTAL CALCIUM) 500 MG chewable tablet Chew 1 tablet by mouth daily as needed for indigestion or heartburn.     clopidogrel (PLAVIX) 75 MG tablet Take 75 mg by mouth daily.     DULoxetine (CYMBALTA) 30 MG capsule 1 capsule     furosemide (LASIX) 20 MG tablet Take 1 tablet (20 mg total) by mouth daily as needed for edema. 30 tablet 3   Lidocaine, Anorectal, (RECTICARE EX) Apply 1 application topically daily as needed (ANKLE ITCHING).     loratadine (CLARITIN) 10 MG tablet Take 10 mg by mouth daily as needed for allergies.     meclizine (ANTIVERT) 25 MG tablet Take 25 mg by mouth 3 (three) times daily as needed.      metoprolol succinate (TOPROL-XL) 25 MG 24 hr tablet Take 12.5 mg by mouth daily.      naproxen sodium (ANAPROX) 220 MG tablet Take 220 mg by mouth daily.     nitroGLYCERIN (NITROSTAT) 0.4 MG SL tablet Place 0.4 mg under the tongue every 5 (five) minutes as needed for chest pain (x 3 tablets daily).     ranolazine (RANEXA) 500 MG 12 hr tablet Take 500 mg by mouth 2 (two) times daily.     valACYclovir (VALTREX) 1000 MG tablet Take 1,000 mg by mouth daily as needed. Outbreak.     valsartan (DIOVAN) 160 MG tablet Take 160 mg by mouth daily.     zolpidem (AMBIEN) 5  MG tablet Take 2.5 mg by mouth at bedtime as needed for sleep.      No current facility-administered medications for this visit.    Allergies: Allergies  Allergen Reactions   Flexeril [Cyclobenzaprine] Swelling   Methocarbamol Swelling   Orphenadrine Citrate Swelling   Lisinopril Cough   Ramipril Other (See Comments)    DRY COUGH    Social History: The patient  reports that he has never smoked. He has never used smokeless tobacco. He reports current alcohol use. He reports that he does not use drugs.   Family History: The patient's family history includes Hypertension in his mother; Lung cancer in his father.   Review of Systems: Please see the history of present illness.   Otherwise, the review of systems is positive for none.   All other systems are reviewed and negative.   Physical Exam: VS:  BP (!) 146/82    Pulse 69    Ht 6\' 4"  (1.93 m)    Wt 228 lb (103.4 kg)    SpO2 98%    BMI 27.75 kg/m  .  BMI Body mass index is 27.75 kg/m.  Wt Readings from Last 3 Encounters:  01/13/22 228 lb (103.4 kg)  01/16/21 242 lb (109.8 kg)  08/02/20 230 lb (104.3 kg)    GENERAL:  Well appearing WM in NAD HEENT:  PERRL, EOMI, sclera are clear. Oropharynx is clear. NECK:  No jugular venous distention, carotid upstroke brisk and symmetric, no bruits, no thyromegaly or adenopathy LUNGS:  Clear to auscultation bilaterally CHEST:   Unremarkable HEART:  RRR,  PMI not displaced or sustained,S1 and S2 within normal limits, no S3, no S4: no clicks, no rubs, no murmurs ABD:  Soft, nontender. BS +, no masses or bruits. No hepatomegaly, no splenomegaly EXT:  2 + pulses throughout, no edema, no cyanosis no clubbing SKIN:  Warm and dry.  No rashes NEURO:  Alert and oriented x 3. Cranial nerves II through XII intact. PSYCH:  Cognitively intact     LABORATORY DATA:  EKG:  EKG is ordered today. NSR rate 69. LAD, RBBB. LVH with repolarization abnormality. I have personally reviewed and interpreted this study..   Lab Results  Component Value Date   WBC 5.1 05/11/2019   HGB 15.5 05/11/2019   HCT 46.8 05/11/2019   PLT 178 05/11/2019   GLUCOSE 79 05/12/2019   CHOL 88 05/12/2019   TRIG 75 05/12/2019   HDL 34 (L) 05/12/2019   LDLCALC 39 05/12/2019   ALT 22 12/01/2017   AST 31 12/01/2017   NA 137 05/12/2019   K 4.0 05/12/2019   CL 107 05/12/2019   CREATININE 0.97 05/12/2019   BUN 13 05/12/2019   CO2 23 05/12/2019   INR 1.0 04/27/2017   Dated 06/17/18: cholesterol 97, triglycerides 62, HDL 43, LDL 42. A1c 5%. CBC, CMET and TSH normal.  Dated 06/21/19: cholesterol 108, triglycerides 63, HDL 43, LDL 52, A1c 4.8%. creatinine 1.2. otherwise CMET, CBC, TSH normal Dated 07/10/20: cholesterol 102, triglycerides 67, HDL 43, LDL 46. A1c 4.9%. creatinine 1.3. otherwise CMET and CBC normal. Dated 06/06/21: cholesterol 120, triglycerides 68, HDL 43, LDL 63, A1c 5%, CMET and TSH normal  BNP (last 3 results) No results for input(s): BNP in the last 8760 hours.  ProBNP (last 3 results) No results for input(s): PROBNP in the last 8760 hours.   Other Studies Reviewed Today:  Myoview: 04/23/17: Study Highlights     The left ventricular ejection fraction is mildly decreased (45-54%).  Nuclear stress EF: 47%. There was no ST segment deviation noted during stress. Defect 1: There is a small defect of moderate severity present in the  mid inferior and apical inferior location. Findings consistent with ischemia. This is an intermediate risk study.   There is small area mild severity reversible defect in the mid and apical inferior walls (SDS =2). LVEF is mildly decreased at 47%   Cardiac cath: 05/01/17: Conclusion     Prox RCA lesion, 20 %stenosed. Dist RCA lesion, 50 %stenosed. 1st Mrg lesion, 0 %stenosed. 1st Diag lesion, 0 %stenosed. Ost 1st Mrg to 1st Mrg lesion, 20 %stenosed. Prox Cx to Mid Cx lesion, 20 %stenosed. The left ventricular systolic function is normal. LV end diastolic pressure is moderately elevated. The left ventricular ejection fraction is 55-65% by visual estimate.   1. Nonobstructive CAD 2. Normal LV function 3. Elevated LVEDP   Plan: compared to last angiogram there is no significant change. Will continue medical therapy and consider other causes of chest pain.      Cath: 05/11/19   Non-stenotic 1st Diag lesion was previously treated. Prox Cx to Mid Cx lesion is 20% stenosed. Non-stenotic 1st Mrg lesion was previously treated. Ost 1st Mrg to 1st Mrg lesion is 20% stenosed. Prox RCA lesion is 20% stenosed. Dist RCA lesion is 50% stenosed. The left ventricular systolic function is normal. LV end diastolic pressure is normal. The left ventricular ejection fraction is 55-65% by visual estimate.   1. Nonobstructive CAD- unchanged from May 2018 2. Normal LV function 3. Normal LVEDP   Plan: no new findings to explain his chest pain. Continue medical therapy  Echo 05/12/19: IMPRESSIONS      1. The left ventricle has normal systolic function with an ejection fraction of 60-65%. The cavity size was normal. There is moderate concentric left ventricular hypertrophy. Left ventricular diastolic Doppler parameters are consistent with impaired  relaxation.  2. The right ventricle has normal systolic function. The cavity was normal. There is no increase in right ventricular wall thickness.  3.  Mild thickening of the mitral valve leaflet.  4. The aortic valve is tricuspid. Moderate thickening of the aortic valve. Moderate calcification of the aortic valve. Aortic valve regurgitation is mild by color flow Doppler. Mild stenosis of the aortic valve.d  Assessment/Plan: 1. CAD with first stent in the 1st OM back in 2003; DES to 2nd DX in March 2015. Negative Myoview from 08/2015.  Last myoview suggested an area of inferior apical ischemia. Subsequent cardiac cath showed nonobstructive disease with stable findings.  Last cardiac cath in June 2020 was unchanged.  No significant angina.  2. HTN - BP is well controlled on his current therapy.   3. HLD - on high dose statin. Excellent control. Labs followed by Dr Brigitte Pulse.   4. Carotid disease with  stable doppler study in July 2022 with 40-59% RICA disease.   5. Obesity/deconditioning - encourage increased aerobic activity.  Follow up in one year.  No orders of the defined types were placed in this encounter.   Signed: Shakya Sebring Martinique MD, [facility]  01/13/2022 10:18 AM  [place]

## 2022-01-13 ENCOUNTER — Other Ambulatory Visit: Payer: Self-pay

## 2022-01-13 ENCOUNTER — Ambulatory Visit (INDEPENDENT_AMBULATORY_CARE_PROVIDER_SITE_OTHER): Payer: Medicare Other | Admitting: Cardiology

## 2022-01-13 ENCOUNTER — Encounter: Payer: Self-pay | Admitting: Cardiology

## 2022-01-13 VITALS — BP 146/82 | HR 69 | Ht 76.0 in | Wt 228.0 lb

## 2022-01-13 DIAGNOSIS — I10 Essential (primary) hypertension: Secondary | ICD-10-CM

## 2022-01-13 DIAGNOSIS — I779 Disorder of arteries and arterioles, unspecified: Secondary | ICD-10-CM | POA: Diagnosis not present

## 2022-01-13 DIAGNOSIS — E7800 Pure hypercholesterolemia, unspecified: Secondary | ICD-10-CM

## 2022-01-13 DIAGNOSIS — I25119 Atherosclerotic heart disease of native coronary artery with unspecified angina pectoris: Secondary | ICD-10-CM | POA: Diagnosis not present

## 2022-01-15 ENCOUNTER — Telehealth: Payer: Self-pay

## 2022-01-15 MED ORDER — RANOLAZINE ER 500 MG PO TB12: 500.0000 mg | ORAL_TABLET | Freq: Two times a day (BID) | ORAL | 3 refills | Status: DC

## 2022-01-15 NOTE — Telephone Encounter (Signed)
Patient walked in office requesting 90 day Ranexa refill to be sent to [facility].Refill sent.

## 2022-01-17 DIAGNOSIS — L309 Dermatitis, unspecified: Secondary | ICD-10-CM | POA: Diagnosis not present

## 2022-01-20 DIAGNOSIS — L309 Dermatitis, unspecified: Secondary | ICD-10-CM | POA: Diagnosis not present

## 2022-02-04 DIAGNOSIS — I10 Essential (primary) hypertension: Secondary | ICD-10-CM | POA: Diagnosis not present

## 2022-02-04 DIAGNOSIS — K219 Gastro-esophageal reflux disease without esophagitis: Secondary | ICD-10-CM | POA: Diagnosis not present

## 2022-02-04 DIAGNOSIS — E785 Hyperlipidemia, unspecified: Secondary | ICD-10-CM | POA: Diagnosis not present

## 2022-02-04 DIAGNOSIS — I2510 Atherosclerotic heart disease of native coronary artery without angina pectoris: Secondary | ICD-10-CM | POA: Diagnosis not present

## 2022-02-20 DIAGNOSIS — G309 Alzheimer's disease, unspecified: Secondary | ICD-10-CM | POA: Diagnosis not present

## 2022-02-20 DIAGNOSIS — R41841 Cognitive communication deficit: Secondary | ICD-10-CM | POA: Diagnosis not present

## 2022-02-27 DIAGNOSIS — I8311 Varicose veins of right lower extremity with inflammation: Secondary | ICD-10-CM | POA: Diagnosis not present

## 2022-02-27 DIAGNOSIS — I872 Venous insufficiency (chronic) (peripheral): Secondary | ICD-10-CM | POA: Diagnosis not present

## 2022-02-27 DIAGNOSIS — I8312 Varicose veins of left lower extremity with inflammation: Secondary | ICD-10-CM | POA: Diagnosis not present

## 2022-04-06 ENCOUNTER — Other Ambulatory Visit: Payer: Self-pay | Admitting: Cardiology

## 2022-07-03 DIAGNOSIS — I10 Essential (primary) hypertension: Secondary | ICD-10-CM | POA: Diagnosis not present

## 2022-07-03 DIAGNOSIS — Z125 Encounter for screening for malignant neoplasm of prostate: Secondary | ICD-10-CM | POA: Diagnosis not present

## 2022-07-03 DIAGNOSIS — R7989 Other specified abnormal findings of blood chemistry: Secondary | ICD-10-CM | POA: Diagnosis not present

## 2022-07-03 DIAGNOSIS — R7301 Impaired fasting glucose: Secondary | ICD-10-CM | POA: Diagnosis not present

## 2022-07-03 DIAGNOSIS — E538 Deficiency of other specified B group vitamins: Secondary | ICD-10-CM | POA: Diagnosis not present

## 2022-07-03 DIAGNOSIS — R5383 Other fatigue: Secondary | ICD-10-CM | POA: Diagnosis not present

## 2022-07-03 DIAGNOSIS — E785 Hyperlipidemia, unspecified: Secondary | ICD-10-CM | POA: Diagnosis not present

## 2022-07-10 DIAGNOSIS — I2510 Atherosclerotic heart disease of native coronary artery without angina pectoris: Secondary | ICD-10-CM | POA: Diagnosis not present

## 2022-07-10 DIAGNOSIS — E538 Deficiency of other specified B group vitamins: Secondary | ICD-10-CM | POA: Diagnosis not present

## 2022-07-10 DIAGNOSIS — F3342 Major depressive disorder, recurrent, in full remission: Secondary | ICD-10-CM | POA: Diagnosis not present

## 2022-07-10 DIAGNOSIS — Z1331 Encounter for screening for depression: Secondary | ICD-10-CM | POA: Diagnosis not present

## 2022-07-10 DIAGNOSIS — N1831 Chronic kidney disease, stage 3a: Secondary | ICD-10-CM | POA: Diagnosis not present

## 2022-07-10 DIAGNOSIS — R82998 Other abnormal findings in urine: Secondary | ICD-10-CM | POA: Diagnosis not present

## 2022-07-10 DIAGNOSIS — Z23 Encounter for immunization: Secondary | ICD-10-CM | POA: Diagnosis not present

## 2022-07-10 DIAGNOSIS — I6529 Occlusion and stenosis of unspecified carotid artery: Secondary | ICD-10-CM | POA: Diagnosis not present

## 2022-07-10 DIAGNOSIS — R7301 Impaired fasting glucose: Secondary | ICD-10-CM | POA: Diagnosis not present

## 2022-07-10 DIAGNOSIS — Z1339 Encounter for screening examination for other mental health and behavioral disorders: Secondary | ICD-10-CM | POA: Diagnosis not present

## 2022-07-10 DIAGNOSIS — F02A0 Dementia in other diseases classified elsewhere, mild, without behavioral disturbance, psychotic disturbance, mood disturbance, and anxiety: Secondary | ICD-10-CM | POA: Diagnosis not present

## 2022-07-10 DIAGNOSIS — E785 Hyperlipidemia, unspecified: Secondary | ICD-10-CM | POA: Diagnosis not present

## 2022-07-10 DIAGNOSIS — G300 Alzheimer's disease with early onset: Secondary | ICD-10-CM | POA: Diagnosis not present

## 2022-07-10 DIAGNOSIS — I129 Hypertensive chronic kidney disease with stage 1 through stage 4 chronic kidney disease, or unspecified chronic kidney disease: Secondary | ICD-10-CM | POA: Diagnosis not present

## 2022-07-10 DIAGNOSIS — Z0000 Encounter for general adult medical examination without abnormal findings: Secondary | ICD-10-CM | POA: Diagnosis not present

## 2022-09-09 DIAGNOSIS — H52203 Unspecified astigmatism, bilateral: Secondary | ICD-10-CM | POA: Diagnosis not present

## 2022-09-09 DIAGNOSIS — Z961 Presence of intraocular lens: Secondary | ICD-10-CM | POA: Diagnosis not present

## 2022-09-12 DIAGNOSIS — Z23 Encounter for immunization: Secondary | ICD-10-CM | POA: Diagnosis not present

## 2022-09-27 DIAGNOSIS — Z23 Encounter for immunization: Secondary | ICD-10-CM | POA: Diagnosis not present

## 2022-10-23 DIAGNOSIS — I8312 Varicose veins of left lower extremity with inflammation: Secondary | ICD-10-CM | POA: Diagnosis not present

## 2022-10-23 DIAGNOSIS — I872 Venous insufficiency (chronic) (peripheral): Secondary | ICD-10-CM | POA: Diagnosis not present

## 2022-10-23 DIAGNOSIS — I8311 Varicose veins of right lower extremity with inflammation: Secondary | ICD-10-CM | POA: Diagnosis not present

## 2022-12-01 ENCOUNTER — Other Ambulatory Visit: Payer: Self-pay | Admitting: Cardiology

## 2023-02-06 NOTE — Progress Notes (Unsigned)
CARDIOLOGY OFFICE NOTE  Date:  02/16/2023    Jared Clark Date of Birth: 1936-04-23 Medical Record [id number]  PCP:  Ginger Organ., MD  Cardiologist:  Solon Alban Martinique  MD  No chief complaint on file.   History of Present Illness: Jared Clark is a 87 y.o. male who is seen for follow up CAD. Other issues include HTN, HLD, GERD, carotid disease and obesity.   In September of 2003 he underwent stenting of his first obtuse marginal vessel. He had a repeat cardiac catheterization in March 2010 after an abnormal stress Cardiolite. The cardiac catheterization showed long term patency of the prior stent and nonobstructive atherosclerotic disease elsewhere and he had normal left ventricular function.   Seen as a work in on 01/25/14 because of recurring chest pain. He had LexiScan Myoview. This showed significant reversible anterolateral ischemia.  On 02/10/14 the patient underwent cardiac catheterization with successful stenting of the second diagonal with a drug-eluting stent. He was seen in September 2016 complaining of atypical chest discomfort. Because of his atypical chest pain the patient underwent a myocardial perfusion scan 08/2015 which showed no ischemia and normal EF. It was felt to be a low risk study.  Seen again in June 2017 with atypical chest pain and referred for cardiac cath which was unchanged.   Seen in May 2018 of chest pain. Myoview study showed a small inferior and apical area of ischemia. EF lower at 47%. We recommended repeat cardiac cath. This showed nonobstructive CAD that was unchanged from prior studies. LV function was normal. EDP elevated 26-27 mm Hg.   In June 2020 he presented with chest pain. Cardiac catheterization was completed on 05/11/2019 revealing nonobstructive CAD unchanged from previous cath back in May 2018 with normal LV function and LVEDP.  He was to be continued on medical management with Plavix, statin therapy, beta-blocker, and ARB.  Ranexa  was initiated at 500 mg twice daily for possible microvascular angina.   On follow up today he is doing well. He is living at [facility]. He is sedentary.  He  has been diagnosed with mild Alzheimers. He states he really hasn't had any chest pain. Feels a slight palpitation lasting only a few seconds. Always in the same place in his chest. He is trying to keep his weight down and has lost 1 lbs from last year.   Past Medical History:  Diagnosis Date   Anginal pain ([place])    Borderline diabetes    DIET CONTROLLED   BPH (benign prostatic hypertrophy)    S/P TURP 1995   Carotid artery disease ([facility])    a. Carotid duplex in 05/2013: [id number] RICA, [id number] LICA. b. Per Patient, doppler 05/2015 showed 1-39% on the R - being folloewd by primary care.   Cataract of right eye    Coronary artery disease    a. 2003 s/p PCI/stenting; b. 02/2014 Abnl nuc -> Cath/PCI: LM nl, LAD nl, D2 95p (2.5x12 Promus DES), LCX <20, OM1 patent stent, RCA 50d, EF 55-65%. c. 08/2015: normal nuc.   Depression    Dyslipidemia    History of dyslipidemia   GERD (gastroesophageal reflux disease)    Hypertension    Osteoarthritis    RIGHT ANKLE, BILATERAL WRIST, BACK   Reflux OCCASIONAL   TAKES PRILOSEC   Sinus bradycardia     Past Surgical History:  Procedure Laterality Date   BACK SURGERY     CARDIAC CATHETERIZATION  02-06-09   NO INTERVENTION  CARDIAC CATHETERIZATION N/A 05/22/2016   Procedure: Left Heart Cath and Coronary Angiography;  Surgeon: Kori Colin M Martinique, MD;  Location: [facility];  Service: Cardiovascular;  Laterality: N/A;   CAROTID STENT     CERVICAL DISCECTOMY  1989   C4 - 5   CHOLECYSTECTOMY  123XX123   W/ UMBILICAL HERNIA REPAIR   CORONARY ANGIOPLASTY  2003- FIRST OBTUSE MARGIANL VESSEL    X1 CYPHER STENT   CORONARY STENT PLACEMENT     DES to first diagonal        DR Martinique   EYE SURGERY     HERNIA REPAIR  [id number]   Umbilical   INGUINAL HERNIA REPAIR  1985   KNEE ARTHROSCOPY  11/05/2011    Procedure: ARTHROSCOPY KNEE;  Surgeon: Gearlean Alf;  Location: [place];  Service: Orthopedics;  Laterality: Right;  RIGHT ARTHROSCOPY KNEE WITH DEBRIDEMENT, Chondroplasty   LEFT HEART CATH AND CORONARY ANGIOGRAPHY N/A 05/01/2017   Procedure: Left Heart Cath and Coronary Angiography;  Surgeon: Martinique, Hillery Bhalla M, MD;  Location: [facility];  Service: Cardiovascular;  Laterality: N/A;   LEFT HEART CATH AND CORONARY ANGIOGRAPHY N/A 05/11/2019   Procedure: LEFT HEART CATH AND CORONARY ANGIOGRAPHY;  Surgeon: Martinique, Charleton Deyoung M, MD;  Location: [facility] [facility];  Service: Cardiovascular;  Laterality: N/A;   LEFT HEART CATHETERIZATION WITH CORONARY ANGIOGRAM N/A 02/10/2014   Procedure: LEFT HEART CATHETERIZATION WITH CORONARY ANGIOGRAM;  Surgeon: Keayra Graham M Martinique, MD;  Location: [facility];  Service: Cardiovascular;  Laterality: N/A;   LUMBAR LAMINECTOMY  1973   L4-S1   PILONIDAL CYST EXCISION  1961   SHOULDER ARTHROSCOPY     Bilateral   TONSILLECTOMY AND ADENOIDECTOMY  CHILD   TRANSURETHRAL RESECTION OF PROSTATE  1995     Medications: Current Outpatient Medications  Medication Sig Dispense Refill   aspirin 81 MG tablet Take 81 mg by mouth daily.     atorvastatin (LIPITOR) 40 MG tablet TAKE 1 TABLET BY MOUTH DAILY 90 tablet 3   clopidogrel (PLAVIX) 75 MG tablet Take 75 mg by mouth daily.     DULoxetine (CYMBALTA) 30 MG capsule 1 capsule     meclizine (ANTIVERT) 25 MG tablet Take 25 mg by mouth 3 (three) times daily as needed.     metoprolol succinate (TOPROL-XL) 25 MG 24 hr tablet Take 12.5 mg by mouth daily.      naproxen sodium (ANAPROX) 220 MG tablet Take 220 mg by mouth daily.     nitroGLYCERIN (NITROSTAT) 0.4 MG SL tablet Place 0.4 mg under the tongue every 5 (five) minutes as needed for chest pain (x 3 tablets daily).     ranolazine (RANEXA) 500 MG 12 hr tablet TAKE 1 TABLET BY MOUTH TWICE  DAILY 180 tablet 3   valACYclovir (VALTREX) 1000 MG tablet Take 1,000 mg by  mouth daily as needed. Outbreak.     valsartan (DIOVAN) 160 MG tablet Take 160 mg by mouth daily.     zolpidem (AMBIEN) 5 MG tablet Take 2.5 mg by mouth at bedtime as needed for sleep.      No current facility-administered medications for this visit.    Allergies: Allergies  Allergen Reactions   Flexeril [Cyclobenzaprine] Swelling   Methocarbamol Swelling   Orphenadrine Citrate Swelling   Lisinopril Cough   Ramipril Other (See Comments)    DRY COUGH    Social History: The patient  reports that he has never smoked. He has never used smokeless tobacco. He  reports current alcohol use. He reports that he does not use drugs.   Family History: The patient's family history includes Hypertension in his mother; Lung cancer in his father.   Review of Systems: Please see the history of present illness.   Otherwise, the review of systems is positive for none.   All other systems are reviewed and negative.   Physical Exam: VS:  BP (!) 148/74   Pulse (!) 58   Ht '6\' 4"'$  (1.93 m)   Wt 227 lb (103 kg)   SpO2 97%   BMI 27.63 kg/m  .  BMI Body mass index is 27.63 kg/m.  Wt Readings from Last 3 Encounters:  02/16/23 227 lb (103 kg)  01/13/22 228 lb (103.4 kg)  01/16/21 242 lb (109.8 kg)    GENERAL:  Well appearing WM in NAD HEENT:  PERRL, EOMI, sclera are clear. Oropharynx is clear. NECK:  No jugular venous distention, carotid upstroke brisk and symmetric, no bruits, no thyromegaly or adenopathy LUNGS:  Clear to auscultation bilaterally CHEST:  Unremarkable HEART:  RRR,  PMI not displaced or sustained,S1 and S2 within normal limits, no S3, no S4: no clicks, no rubs, no murmurs ABD:  Soft, nontender. BS +, no masses or bruits. No hepatomegaly, no splenomegaly EXT:  2 + pulses throughout, no edema, no cyanosis no clubbing SKIN:  Warm and dry.  No rashes NEURO:  Alert and oriented x 3. Cranial nerves II through XII intact. PSYCH:  Cognitively intact     LABORATORY DATA:  EKG:  EKG  is ordered today. NSR rate 58. LAD,  LVH with repolarization abnormality. I have personally reviewed and interpreted this study..   Lab Results  Component Value Date   WBC 5.1 05/11/2019   HGB 15.5 05/11/2019   HCT 46.8 05/11/2019   PLT 178 05/11/2019   GLUCOSE 79 05/12/2019   CHOL 88 05/12/2019   TRIG 75 05/12/2019   HDL 34 (L) 05/12/2019   LDLCALC 39 05/12/2019   ALT 22 12/01/2017   AST 31 12/01/2017   NA 137 05/12/2019   K 4.0 05/12/2019   CL 107 05/12/2019   CREATININE 0.97 05/12/2019   BUN 13 05/12/2019   CO2 23 05/12/2019   INR 1.0 04/27/2017   Dated 06/17/18: cholesterol 97, triglycerides 62, HDL 43, LDL 42. A1c 5%. CBC, CMET and TSH normal.  Dated 06/21/19: cholesterol 108, triglycerides 63, HDL 43, LDL 52, A1c 4.8%. creatinine 1.2. otherwise CMET, CBC, TSH normal Dated 07/10/20: cholesterol 102, triglycerides 67, HDL 43, LDL 46. A1c 4.9%. creatinine 1.3. otherwise CMET and CBC normal. Dated 06/06/21: cholesterol 120, triglycerides 68, HDL 43, LDL 63, A1c 5%, CMET and TSH normal Dated 07/03/22: cholesterol 105, triglycerides 89, HDL 58, LDL 29. A1c 5%. Creatinine 1.3. otherwise CMET and TSH normal.   BNP (last 3 results) No results for input(s): "BNP" in the last 8760 hours.  ProBNP (last 3 results) No results for input(s): "PROBNP" in the last 8760 hours.   Other Studies Reviewed Today:  Myoview: 04/23/17: Study Highlights     The left ventricular ejection fraction is mildly decreased (45-54%). Nuclear stress EF: 47%. There was no ST segment deviation noted during stress. Defect 1: There is a small defect of moderate severity present in the mid inferior and apical inferior location. Findings consistent with ischemia. This is an intermediate risk study.   There is small area mild severity reversible defect in the mid and apical inferior walls (SDS =2). LVEF is mildly decreased at 47%  Cardiac cath: 05/01/17: Conclusion     Prox RCA lesion, 20 %stenosed. Dist  RCA lesion, 50 %stenosed. 1st Mrg lesion, 0 %stenosed. 1st Diag lesion, 0 %stenosed. Ost 1st Mrg to 1st Mrg lesion, 20 %stenosed. Prox Cx to Mid Cx lesion, 20 %stenosed. The left ventricular systolic function is normal. LV end diastolic pressure is moderately elevated. The left ventricular ejection fraction is 55-65% by visual estimate.   1. Nonobstructive CAD 2. Normal LV function 3. Elevated LVEDP   Plan: compared to last angiogram there is no significant change. Will continue medical therapy and consider other causes of chest pain.      Cath: 05/11/19   Non-stenotic 1st Diag lesion was previously treated. Prox Cx to Mid Cx lesion is 20% stenosed. Non-stenotic 1st Mrg lesion was previously treated. Ost 1st Mrg to 1st Mrg lesion is 20% stenosed. Prox RCA lesion is 20% stenosed. Dist RCA lesion is 50% stenosed. The left ventricular systolic function is normal. LV end diastolic pressure is normal. The left ventricular ejection fraction is 55-65% by visual estimate.   1. Nonobstructive CAD- unchanged from May 2018 2. Normal LV function 3. Normal LVEDP   Plan: no new findings to explain his chest pain. Continue medical therapy  Echo 05/12/19: IMPRESSIONS      1. The left ventricle has normal systolic function with an ejection fraction of 60-65%. The cavity size was normal. There is moderate concentric left ventricular hypertrophy. Left ventricular diastolic Doppler parameters are consistent with impaired  relaxation.  2. The right ventricle has normal systolic function. The cavity was normal. There is no increase in right ventricular wall thickness.  3. Mild thickening of the mitral valve leaflet.  4. The aortic valve is tricuspid. Moderate thickening of the aortic valve. Moderate calcification of the aortic valve. Aortic valve regurgitation is mild by color flow Doppler. Mild stenosis of the aortic valve.d  Assessment/Plan: 1. CAD with first stent in the 1st OM back in 2003-  Cypher; DES to 2nd DX in March 2015. Negative Myoview from 08/2015.  Last myoview suggested an area of inferior apical ischemia. Subsequent cardiac cath showed nonobstructive disease with stable findings.  Last cardiac cath in June 2020 was unchanged.  No significant angina. We discussed trying to lessen his medication load. I told him he could stop the Ranexa as long as he is not having significant angina/chest pain  2. HTN - BP is well controlled on his current therapy.   3. HLD - on high dose statin. Excellent control. Labs followed by Dr Brigitte Pulse.   4. Carotid disease with  stable doppler study in July 2022 with 40-59% RICA disease. Will update dopplers now.   5. Obesity/deconditioning - encourage increased aerobic activity.  Follow up in one year.  No orders of the defined types were placed in this encounter.   Signed: Nancye Grumbine Martinique MD, [facility]  02/16/2023 4:21 PM  [place]

## 2023-02-16 ENCOUNTER — Encounter: Payer: Self-pay | Admitting: Cardiology

## 2023-02-16 ENCOUNTER — Ambulatory Visit: Payer: Medicare Other | Attending: Cardiology | Admitting: Cardiology

## 2023-02-16 VITALS — BP 148/74 | HR 58 | Ht 76.0 in | Wt 227.0 lb

## 2023-02-16 DIAGNOSIS — I10 Essential (primary) hypertension: Secondary | ICD-10-CM | POA: Diagnosis not present

## 2023-02-16 DIAGNOSIS — I25119 Atherosclerotic heart disease of native coronary artery with unspecified angina pectoris: Secondary | ICD-10-CM | POA: Insufficient documentation

## 2023-02-16 DIAGNOSIS — E7800 Pure hypercholesterolemia, unspecified: Secondary | ICD-10-CM | POA: Diagnosis not present

## 2023-02-16 DIAGNOSIS — I779 Disorder of arteries and arterioles, unspecified: Secondary | ICD-10-CM | POA: Diagnosis not present

## 2023-02-16 NOTE — Patient Instructions (Addendum)
Medication Instructions:   Per Dr. Martinique you may STOP the Ranexa medication if you are not having any chest pain   *If you need a refill on your cardiac medications before your next appointment, please call your pharmacy*  Lab Work: NONE ordered at this time of appointment   If you have labs (blood work) drawn today and your tests are completely normal, you will receive your results only by: [place] (if you have MyChart) OR A paper copy in the mail If you have any lab test that is abnormal or we need to change your treatment, we will call you to review the results.  Testing/Procedures: Your physician has requested that you have a carotid duplex. This test is an ultrasound of the carotid arteries in your neck. It looks at blood flow through these arteries that supply the brain with blood. Allow one hour for this exam. There are no restrictions or special instructions.   Follow-Up: At [facility], you and your health needs are our priority.  As part of our continuing mission to provide you with exceptional heart care, we have created designated Provider Care Teams.  These Care Teams include your primary Cardiologist (physician) and Advanced Practice Providers (APPs -  Physician Assistants and Nurse Practitioners) who all work together to provide you with the care you need, when you need it.  Your next appointment:   1 year(s)  Provider:   Peter Martinique, MD     Other Instructions

## 2023-03-03 ENCOUNTER — Ambulatory Visit (HOSPITAL_COMMUNITY)
Admission: RE | Admit: 2023-03-03 | Discharge: 2023-03-03 | Disposition: A | Discharge status: Home / Self Care | Payer: Medicare Other | Source: Ambulatory Visit | Attending: Cardiology | Admitting: Cardiology

## 2023-03-03 DIAGNOSIS — I779 Disorder of arteries and arterioles, unspecified: Secondary | ICD-10-CM | POA: Insufficient documentation

## 2023-03-03 DIAGNOSIS — I6521 Occlusion and stenosis of right carotid artery: Secondary | ICD-10-CM | POA: Diagnosis not present

## 2023-03-26 ENCOUNTER — Other Ambulatory Visit (HOSPITAL_COMMUNITY): Payer: Self-pay | Admitting: Cardiology

## 2023-03-26 ENCOUNTER — Other Ambulatory Visit: Payer: Self-pay | Admitting: Cardiology

## 2023-03-26 DIAGNOSIS — I6521 Occlusion and stenosis of right carotid artery: Secondary | ICD-10-CM

## 2023-07-15 DIAGNOSIS — E785 Hyperlipidemia, unspecified: Secondary | ICD-10-CM | POA: Diagnosis not present

## 2023-07-15 DIAGNOSIS — Z125 Encounter for screening for malignant neoplasm of prostate: Secondary | ICD-10-CM | POA: Diagnosis not present

## 2023-07-15 DIAGNOSIS — I2510 Atherosclerotic heart disease of native coronary artery without angina pectoris: Secondary | ICD-10-CM | POA: Diagnosis not present

## 2023-07-15 DIAGNOSIS — N1831 Chronic kidney disease, stage 3a: Secondary | ICD-10-CM | POA: Diagnosis not present

## 2023-07-15 DIAGNOSIS — I129 Hypertensive chronic kidney disease with stage 1 through stage 4 chronic kidney disease, or unspecified chronic kidney disease: Secondary | ICD-10-CM | POA: Diagnosis not present

## 2023-07-15 DIAGNOSIS — R7301 Impaired fasting glucose: Secondary | ICD-10-CM | POA: Diagnosis not present

## 2023-07-15 DIAGNOSIS — E538 Deficiency of other specified B group vitamins: Secondary | ICD-10-CM | POA: Diagnosis not present

## 2023-07-22 DIAGNOSIS — K5909 Other constipation: Secondary | ICD-10-CM | POA: Diagnosis not present

## 2023-07-22 DIAGNOSIS — I2510 Atherosclerotic heart disease of native coronary artery without angina pectoris: Secondary | ICD-10-CM | POA: Diagnosis not present

## 2023-07-22 DIAGNOSIS — Z23 Encounter for immunization: Secondary | ICD-10-CM | POA: Diagnosis not present

## 2023-07-22 DIAGNOSIS — I129 Hypertensive chronic kidney disease with stage 1 through stage 4 chronic kidney disease, or unspecified chronic kidney disease: Secondary | ICD-10-CM | POA: Diagnosis not present

## 2023-07-22 DIAGNOSIS — J309 Allergic rhinitis, unspecified: Secondary | ICD-10-CM | POA: Diagnosis not present

## 2023-07-22 DIAGNOSIS — I6529 Occlusion and stenosis of unspecified carotid artery: Secondary | ICD-10-CM | POA: Diagnosis not present

## 2023-07-22 DIAGNOSIS — G300 Alzheimer's disease with early onset: Secondary | ICD-10-CM | POA: Diagnosis not present

## 2023-07-22 DIAGNOSIS — Z0000 Encounter for general adult medical examination without abnormal findings: Secondary | ICD-10-CM | POA: Diagnosis not present

## 2023-07-22 DIAGNOSIS — H8110 Benign paroxysmal vertigo, unspecified ear: Secondary | ICD-10-CM | POA: Diagnosis not present

## 2023-07-22 DIAGNOSIS — N1831 Chronic kidney disease, stage 3a: Secondary | ICD-10-CM | POA: Diagnosis not present

## 2023-07-22 DIAGNOSIS — Z1331 Encounter for screening for depression: Secondary | ICD-10-CM | POA: Diagnosis not present

## 2023-07-22 DIAGNOSIS — F3342 Major depressive disorder, recurrent, in full remission: Secondary | ICD-10-CM | POA: Diagnosis not present

## 2023-07-22 DIAGNOSIS — E785 Hyperlipidemia, unspecified: Secondary | ICD-10-CM | POA: Diagnosis not present

## 2023-07-22 DIAGNOSIS — I10 Essential (primary) hypertension: Secondary | ICD-10-CM | POA: Diagnosis not present

## 2023-07-22 DIAGNOSIS — R7301 Impaired fasting glucose: Secondary | ICD-10-CM | POA: Diagnosis not present

## 2023-07-22 DIAGNOSIS — R82998 Other abnormal findings in urine: Secondary | ICD-10-CM | POA: Diagnosis not present

## 2023-07-22 DIAGNOSIS — F02A0 Dementia in other diseases classified elsewhere, mild, without behavioral disturbance, psychotic disturbance, mood disturbance, and anxiety: Secondary | ICD-10-CM | POA: Diagnosis not present

## 2023-07-22 DIAGNOSIS — Z1339 Encounter for screening examination for other mental health and behavioral disorders: Secondary | ICD-10-CM | POA: Diagnosis not present

## 2023-07-24 ENCOUNTER — Encounter: Payer: Self-pay | Admitting: Physician Assistant

## 2023-08-17 NOTE — Progress Notes (Unsigned)
Assessment/Plan:    The patient is seen in neurologic consultation at the request of Cleatis Polka., MD for the evaluation of memory.  Jared Clark is a very pleasant 87 y.o. year old RH male with a history of hypertension, hyperlipidemia, seen today for evaluation of memory loss. MoCA today is  /30*** Patient is able to participate on some IADLs, but needs assistance with ***   Memory Impairment  MRI brain without contrast to assess for underlying structural abnormality and assess vascular load  Check B12, TSH Recommend good control of cardiovascular risk factors.   Continue to control mood as per PCP Folllow up in    Subjective:    The patient is accompanied by ***  who supplements the history.    How long did patient have memory difficulties? Patient has difficulty remembering recent conversations and people names.  repeats oneself?  Endorsed Disoriented when walking into a room?  Denies except occasionally not remembering what patient came to the room for ***  Leaving objects in unusual places?   denies   Wandering behavior? denies   Any personality changes ? denies   Any history of depression?: denies   Hallucinations or paranoia?  denies   Seizures? denies    Any sleep changes?  Denies  vivid dreams, REM behavior or sleepwalking   Sleep apnea? denies   Any hygiene concerns?  denies   Independent of bathing and dressing?  Endorsed  Does the patient need help with medications? is in charge *** Who is in charge of the finances?   is in charge   *** Any changes in appetite?   denies ***   Patient have trouble swallowing?  denies   Does the patient cook?  No***  Any headaches?  denies   Chronic back pain?  denies   Ambulates with difficulty? Needs a walker to ambulate ***   Recent falls or head injuries? denies     Vision changes? Stroke like symptoms?  denies   Any tremors?  denies   Any anosmia?  denies   Any incontinence of urine? denies   Any bowel  dysfunction? denies      Patient lives  ***  History of heavy alcohol intake? denies   History of heavy tobacco use? denies   Family history of dementia?   ***  Does patient drive?***No longer drives  Allergies  Allergen Reactions   Flexeril [Cyclobenzaprine] Swelling   Methocarbamol Swelling   Orphenadrine Citrate Swelling   Lisinopril Cough   Ramipril Other (See Comments)    DRY COUGH    Current Outpatient Medications  Medication Instructions   aspirin 81 mg, Oral, Daily   atorvastatin (LIPITOR) 40 MG tablet TAKE 1 TABLET BY MOUTH DAILY   clopidogrel (PLAVIX) 75 mg, Oral, Daily   DULoxetine (CYMBALTA) 30 MG capsule 1 capsule   meclizine (ANTIVERT) 25 mg, Oral, 3 times daily PRN   metoprolol succinate (TOPROL-XL) 12.5 mg, Oral, Daily   naproxen sodium (ALEVE) 220 mg, Oral, Daily   nitroGLYCERIN (NITROSTAT) 0.4 mg, Sublingual, Every 5 min PRN   ranolazine (RANEXA) 500 mg, Oral, 2 times daily   valACYclovir (VALTREX) 1,000 mg, Oral, Daily PRN, Outbreak.   valsartan (DIOVAN) 160 mg, Oral, Daily   zolpidem (AMBIEN) 2.5 mg, Oral, At bedtime PRN     VITALS:  There were no vitals filed for this visit.    06/12/2014    9:34 AM 03/17/2014    9:21 AM 03/13/2014  9:10 AM  Depression screen PHQ 2/9  Decreased Interest 1 0 0  Down, Depressed, Hopeless 1 1 1   PHQ - 2 Score 2 1 1   Altered sleeping 0    Tired, decreased energy 3    Change in appetite 0    Feeling bad or failure about yourself  0    Trouble concentrating 0    Moving slowly or fidgety/restless 0    Suicidal thoughts 0    PHQ-9 Score 5      PHYSICAL EXAM   HEENT:  Normocephalic, atraumatic. The mucous membranes are moist. The superficial temporal arteries are without ropiness or tenderness. Cardiovascular: Regular rate and rhythm. Lungs: Clear to auscultation bilaterally. Neck: There are no carotid bruits noted bilaterally.  NEUROLOGICAL:     No data to display              No data to display            Orientation:  Alert and oriented to person, place and time. No aphasia or dysarthria. Fund of knowledge is reduced. Recent and remote memory impaired.  Attention and concentration are reduced.  Able to name objects and unable to repeat phrases. Delayed recall    Cranial nerves: There is good facial symmetry. Extraocular muscles are intact and visual fields are full to confrontational testing. Speech is fluent and clear, no tongue deviation. Hearing is intact to conversational tone.*** Tone: Tone is good throughout. Sensation: Sensation is intact to light touch and pinprick throughout. Vibration is intact at the bilateral big toe. Coordination: The patient has no difficulty with RAM's or FNF bilaterally. Normal finger to nose  Motor: Strength is 5/5 in the bilateral upper and lower extremities. There is no pronator drift. There are no fasciculations noted. DTR's: Deep tendon reflexes are 2/4 .  Plantar responses are downgoing bilaterally. Gait and Station: The patient is able to ambulate with difficulty. Needs a walker to ambulate ***. Gait is cautious and narrow.      Thank you for allowing [place] the opportunity to participate in the care of this nice patient. Please do not hesitate to contact us for any questions or concerns.   Total time spent on today's visit was *** minutes dedicated to this patient today, preparing to see patient, examining the patient, ordering tests and/or medications and counseling the patient, documenting clinical information in the EHR or other health record, independently interpreting results and communicating results to the patient/family, discussing treatment and goals, answering patient's questions and coordinating care.  Cc:  Cleatis Polka., MD  Marlowe Kays 08/17/2023 8:35 AM

## 2023-08-18 ENCOUNTER — Encounter: Payer: Self-pay | Admitting: Physician Assistant

## 2023-08-18 ENCOUNTER — Ambulatory Visit: Payer: Medicare Other

## 2023-08-18 ENCOUNTER — Ambulatory Visit (INDEPENDENT_AMBULATORY_CARE_PROVIDER_SITE_OTHER): Payer: Medicare Other | Admitting: Physician Assistant

## 2023-08-18 VITALS — BP 154/80 | HR 63 | Resp 20 | Wt 229.0 lb

## 2023-08-18 DIAGNOSIS — R413 Other amnesia: Secondary | ICD-10-CM

## 2023-08-18 MED ORDER — MEMANTINE HCL 5 MG PO TABS: ORAL_TABLET | ORAL | 11 refills | Status: DC

## 2023-08-18 NOTE — Patient Instructions (Addendum)
It was a pleasure to see you today at our office.   Recommendations:  Neurocognitive evaluation at our office.   MRI of the brain, the radiology office will call you to arrange you appointment   Continue B12 supplements  Start Memantine 5 mg tablets.  Take 1 tablet at bedtime for 2 weeks, then 1 tablet twice daily.    Follow up  Dec 17 at  11 am    For psychiatric meds, mood meds: Please have your primary care physician manage these medications.  If you have any severe symptoms of a stroke, or other severe issues such as confusion,severe chills or fever, etc call 911 or go to the ER as you may need to be evaluated further     For assessment of decision of mental capacity and competency:  Call Dr. Erick Blinks, geriatric psychiatrist at [phone]  Counseling regarding caregiver distress, including caregiver depression, anxiety and issues regarding community resources, adult day care programs, adult living facilities, or memory care questions:  please contact your  Primary Doctor's Social Worker   Whom to call: Memory  decline, memory medications: Call our office [phone]    [email or web address]   RECOMMENDATIONS FOR ALL PATIENTS WITH MEMORY PROBLEMS: 1. Continue to exercise (Recommend 30 minutes of walking everyday, or 3 hours every week) 2. Increase social interactions - continue going to [place] and enjoy social gatherings with friends and family 3. Eat healthy, avoid fried foods and eat more fruits and vegetables 4. Maintain adequate blood pressure, blood sugar, and blood cholesterol level. Reducing the risk of stroke and cardiovascular disease also helps promoting better memory. 5. Avoid stressful situations. Live a simple life and avoid aggravations. Organize your time and prepare for the next day in anticipation. 6. Sleep well, avoid any interruptions of sleep and avoid any distractions in the bedroom that may  interfere with adequate sleep quality 7. Avoid sugar, avoid sweets as there is a strong link between excessive sugar intake, diabetes, and cognitive impairment We discussed the Mediterranean diet, which has been shown to help patients reduce the risk of progressive memory disorders and reduces cardiovascular risk. This includes eating fish, eat fruits and green leafy vegetables, nuts like almonds and hazelnuts, walnuts, and also use olive oil. Avoid fast foods and fried foods as much as possible. Avoid sweets and sugar as sugar use has been linked to worsening of memory function.  There is always a concern of gradual progression of memory problems. If this is the case, then we may need to adjust level of care according to patient needs. Support, both to the patient and caregiver, should then be put into place.      You have been referred for a neuropsychological evaluation (i.e., evaluation of memory and thinking abilities). Please bring someone with you to this appointment if possible, as it is helpful for the doctor to hear from both you and another adult who knows you well. Please bring eyeglasses and hearing aids if you wear them.    The evaluation will take approximately 3 hours and has two parts:   The first part is a clinical interview with the neuropsychologist (Dr. Milbert Coulter or Dr. Roseanne Reno). During the interview, the neuropsychologist will speak with you and the individual you brought to the appointment.    The second part of the evaluation is testing with the doctor's technician Annabelle Harman or Selena Batten). During the testing, the technician will ask you to remember different types of material, solve problems, and answer some questionnaires. Your  family member will not be present for this portion of the evaluation.   Please note: We must reserve several hours of the neuropsychologist's time and the psychometrician's time for your evaluation appointment. As such, there is a No-Show fee of $100. If you are  unable to attend any of your appointments, please contact our office as soon as possible to reschedule.      DRIVING: Regarding driving, in patients with progressive memory problems, driving will be impaired. We advise to have someone else do the driving if trouble finding directions or if minor accidents are reported. Independent driving assessment is available to determine safety of driving.   If you are interested in the driving assessment, you can contact the following:  [facility] in [place] [phone]  [facility] [phone]  [facility] [phone]  [facility] [phone] or [phone]   FALL PRECAUTIONS: Be cautious when walking. Scan the area for obstacles that may increase the risk of trips and falls. When getting up in the mornings, sit up at the edge of the bed for a few minutes before getting out of bed. Consider elevating the bed at the head end to avoid drop of blood pressure when getting up. Walk always in a well-lit room (use night lights in the walls). Avoid area rugs or power cords from appliances in the middle of the walkways. Use a walker or a cane if necessary and consider physical therapy for balance exercise. Get your eyesight checked regularly.  FINANCIAL OVERSIGHT: Supervision, especially oversight when making financial decisions or transactions is also recommended.  HOME SAFETY: Consider the safety of the kitchen when operating appliances like stoves, microwave oven, and blender. Consider having supervision and share cooking responsibilities until no longer able to participate in those. Accidents with firearms and other hazards in the house should be identified and addressed as well.   ABILITY TO BE LEFT ALONE: If patient is unable to contact 911 operator, consider using LifeLine, or when the need is there, arrange for someone to stay with patients. Smoking is a fire hazard, consider supervision or  cessation. Risk of wandering should be assessed by caregiver and if detected at any point, supervision and safe proof recommendations should be instituted.  MEDICATION SUPERVISION: Inability to self-administer medication needs to be constantly addressed. Implement a mechanism to ensure safe administration of the medications.      Mediterranean Diet A Mediterranean diet refers to food and lifestyle choices that are based on the traditions of countries located on the [employer] [place]. This way of eating has been shown to help prevent certain conditions and improve outcomes for people who have chronic diseases, like kidney disease and heart disease. What are tips for following this plan? Lifestyle  Cook and eat meals together with your family, when possible. Drink enough fluid to keep your urine clear or pale yellow. Be physically active every day. This includes: Aerobic exercise like running or swimming. Leisure activities like gardening, walking, or housework. Get 7-8 hours of sleep each night. If recommended by your health care provider, drink red wine in moderation. This means 1 glass a day for nonpregnant women and 2 glasses a day for men. A glass of wine equals 5 oz (150 mL). Reading food labels  Check the serving size of packaged foods. For foods such as rice and pasta, the serving size refers to the amount of cooked product, not dry. Check the total fat in packaged foods. Avoid foods that have saturated fat or  trans fats. Check the ingredients list for added sugars, such as corn syrup. Shopping  At the grocery store, buy most of your food from the areas near the walls of the store. This includes: Fresh fruits and vegetables (produce). Grains, beans, nuts, and seeds. Some of these may be available in unpackaged forms or large amounts (in bulk). Fresh seafood. Poultry and eggs. Low-fat dairy products. Buy whole ingredients instead of prepackaged foods. Buy fresh fruits and  vegetables in-season from local farmers markets. Buy frozen fruits and vegetables in resealable bags. If you do not have access to quality fresh seafood, buy precooked frozen shrimp or canned fish, such as tuna, salmon, or sardines. Buy small amounts of raw or cooked vegetables, salads, or olives from the deli or salad bar at your store. Stock your pantry so you always have certain foods on hand, such as olive oil, canned tuna, canned tomatoes, rice, pasta, and beans. Cooking  Cook foods with extra-virgin olive oil instead of using butter or other vegetable oils. Have meat as a side dish, and have vegetables or grains as your main dish. This means having meat in small portions or adding small amounts of meat to foods like pasta or stew. Use beans or vegetables instead of meat in common dishes like chili or lasagna. Experiment with different cooking methods. Try roasting or broiling vegetables instead of steaming or sauteing them. Add frozen vegetables to soups, stews, pasta, or rice. Add nuts or seeds for added healthy fat at each meal. You can add these to yogurt, salads, or vegetable dishes. Marinate fish or vegetables using olive oil, lemon juice, garlic, and fresh herbs. Meal planning  Plan to eat 1 vegetarian meal one day each week. Try to work up to 2 vegetarian meals, if possible. Eat seafood 2 or more times a week. Have healthy snacks readily available, such as: Vegetable sticks with hummus. Greek yogurt. Fruit and nut trail mix. Eat balanced meals throughout the week. This includes: Fruit: 2-3 servings a day Vegetables: 4-5 servings a day Low-fat dairy: 2 servings a day Fish, poultry, or lean meat: 1 serving a day Beans and legumes: 2 or more servings a week Nuts and seeds: 1-2 servings a day Whole grains: 6-8 servings a day Extra-virgin olive oil: 3-4 servings a day Limit red meat and sweets to only a few servings a month What are my food choices? Mediterranean  diet Recommended Grains: Whole-grain pasta. Brown rice. Bulgar wheat. Polenta. Couscous. Whole-wheat bread. Orpah Cobb. Vegetables: Artichokes. Beets. Broccoli. Cabbage. Carrots. Eggplant. Green beans. Chard. Kale. Spinach. Onions. Leeks. Peas. Squash. Tomatoes. Peppers. Radishes. Fruits: Apples. Apricots. Avocado. Berries. Bananas. Cherries. Dates. Figs. Grapes. Lemons. Melon. Oranges. Peaches. Plums. Pomegranate. Meats and other protein foods: Beans. Almonds. Sunflower seeds. Pine nuts. Peanuts. Cod. Salmon. Scallops. Shrimp. Tuna. Tilapia. Clams. Oysters. Eggs. Dairy: Low-fat milk. Cheese. Greek yogurt. Beverages: Water. Red wine. Herbal tea. Fats and oils: Extra virgin olive oil. Avocado oil. Grape seed oil. Sweets and desserts: [place] yogurt with honey. Baked apples. Poached pears. Trail mix. Seasoning and other foods: Basil. Cilantro. Coriander. Cumin. Mint. Parsley. Sage. Rosemary. Tarragon. Garlic. Oregano. Thyme. Pepper. Balsalmic vinegar. Tahini. Hummus. Tomato sauce. Olives. Mushrooms. Limit these Grains: Prepackaged pasta or rice dishes. Prepackaged cereal with added sugar. Vegetables: Deep fried potatoes (french fries). Fruits: Fruit canned in syrup. Meats and other protein foods: Beef. Pork. Lamb. Poultry with skin. Hot dogs. Tomasa Blase. Dairy: Ice cream. Sour cream. Whole milk. Beverages: Juice. Sugar-sweetened soft drinks. Beer. Liquor and spirits. Fats  and oils: Butter. Canola oil. Vegetable oil. Beef fat (tallow). Lard. Sweets and desserts: Cookies. Cakes. Pies. Candy. Seasoning and other foods: Mayonnaise. Premade sauces and marinades. The items listed may not be a complete list. Talk with your dietitian about what dietary choices are right for you. Summary The Mediterranean diet includes both food and lifestyle choices. Eat a variety of fresh fruits and vegetables, beans, nuts, seeds, and whole grains. Limit the amount of red meat and sweets that you eat. Talk with your  health care provider about whether it is safe for you to drink red wine in moderation. This means 1 glass a day for nonpregnant women and 2 glasses a day for men. A glass of wine equals 5 oz (150 mL). This information is not intended to replace advice given to you by your health care provider. Make sure you discuss any questions you have with your health care provider. Document Released: 07/17/2016 Document Revised: 08/19/2016 Document Reviewed: 07/17/2016 Elsevier Interactive Patient Education  2017 [employer].     [place] [facility] [phone]

## 2023-08-20 ENCOUNTER — Encounter: Payer: Self-pay | Admitting: Psychology

## 2023-08-20 DIAGNOSIS — I2510 Atherosclerotic heart disease of native coronary artery without angina pectoris: Secondary | ICD-10-CM | POA: Insufficient documentation

## 2023-08-20 DIAGNOSIS — M1990 Unspecified osteoarthritis, unspecified site: Secondary | ICD-10-CM | POA: Insufficient documentation

## 2023-08-20 DIAGNOSIS — F329 Major depressive disorder, single episode, unspecified: Secondary | ICD-10-CM | POA: Insufficient documentation

## 2023-08-20 DIAGNOSIS — R7303 Prediabetes: Secondary | ICD-10-CM | POA: Insufficient documentation

## 2023-08-21 ENCOUNTER — Ambulatory Visit: Payer: Medicare Other | Admitting: Psychology

## 2023-08-21 ENCOUNTER — Encounter: Payer: Self-pay | Admitting: Psychology

## 2023-08-21 ENCOUNTER — Ambulatory Visit (INDEPENDENT_AMBULATORY_CARE_PROVIDER_SITE_OTHER): Payer: Medicare Other | Admitting: Psychology

## 2023-08-21 DIAGNOSIS — F0280 Dementia in other diseases classified elsewhere without behavioral disturbance: Secondary | ICD-10-CM

## 2023-08-21 DIAGNOSIS — R4189 Other symptoms and signs involving cognitive functions and awareness: Secondary | ICD-10-CM

## 2023-08-21 DIAGNOSIS — G309 Alzheimer's disease, unspecified: Secondary | ICD-10-CM

## 2023-08-21 DIAGNOSIS — F03A0 Unspecified dementia, mild, without behavioral disturbance, psychotic disturbance, mood disturbance, and anxiety: Secondary | ICD-10-CM

## 2023-08-21 HISTORY — DX: Dementia in other diseases classified elsewhere, unspecified severity, without behavioral disturbance, psychotic disturbance, mood disturbance, and anxiety: F02.80

## 2023-08-21 HISTORY — DX: Unspecified dementia, mild, without behavioral disturbance, psychotic disturbance, mood disturbance, and anxiety: F03.A0

## 2023-08-21 NOTE — Progress Notes (Addendum)
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 ask questions and receive answers about the evaluation. Oral consent to participate was provided by the patient.   This evaluation was conducted by Newman Nickels, Ph.D., ABPP-CN, board certified clinical neuropsychologist. [name] completed a clinical interview with Dr. Milbert Coulter, billed as one unit [phone], and 100 minutes of cognitive testing and scoring, billed as one unit [phone] and two additional units [id number]. Psychometrist Wallace Keller, B.S. assisted Dr. Milbert Coulter with test administration and scoring procedures. As a separate and discrete service, one unit [id number] and two units [phone] were billed for Dr. Tammy Sours time spent in interpretation and report writing.

## 2023-08-21 NOTE — Progress Notes (Signed)
   Psychometrician Note   Cognitive testing was administered to Burnadette Peter by Wallace Keller, B.S. (psychometrist) under the supervision of Dr. Newman Nickels, Ph.D., licensed psychologist on 08/21/2023. Mr. Espinoza did not appear overtly distressed by the testing session per behavioral observation or responses across self-report questionnaires. Rest breaks were offered.    The battery of tests administered was selected by Dr. Newman Nickels, Ph.D. with consideration to Mr. Shelly current level of functioning, the nature of his symptoms, emotional and behavioral responses during interview, level of literacy, observed level of motivation/effort, and the nature of the referral question. This battery was communicated to the psychometrist. Communication between Dr. Newman Nickels, Ph.D. and the psychometrist was ongoing throughout the evaluation and Dr. Newman Nickels, Ph.D. was immediately accessible at all times. Dr. Newman Nickels, Ph.D. provided supervision to the psychometrist on the date of this service to the extent necessary to assure the quality of all services provided.    RITHY DORWARD will return within approximately 1-2 weeks for an interactive feedback session with Dr. Milbert Coulter at which time his test performances, clinical impressions, and treatment recommendations will be reviewed in detail. Mr. Farland understands he can contact our office should he require our assistance before this time.  A total of 100 minutes of billable time were spent face-to-face with Mr. Langlitz by the psychometrist. This includes both test administration and scoring time. Billing for these services is reflected in the clinical report generated by Dr. Newman Nickels, Ph.D.  This note reflects time spent with the psychometrician and does not include test scores or any clinical interpretations made by Dr. Milbert Coulter. The full report will follow in a separate note.

## 2023-08-24 ENCOUNTER — Encounter: Payer: Self-pay | Admitting: Psychology

## 2023-08-27 ENCOUNTER — Ambulatory Visit
Admission: RE | Admit: 2023-08-27 | Discharge: 2023-08-27 | Disposition: A | Discharge status: Home / Self Care | Payer: Medicare Other | Source: Ambulatory Visit | Attending: Physician Assistant | Admitting: Physician Assistant

## 2023-08-27 DIAGNOSIS — R413 Other amnesia: Secondary | ICD-10-CM | POA: Diagnosis not present

## 2023-08-27 DIAGNOSIS — G319 Degenerative disease of nervous system, unspecified: Secondary | ICD-10-CM | POA: Diagnosis not present

## 2023-09-01 DIAGNOSIS — Z23 Encounter for immunization: Secondary | ICD-10-CM | POA: Diagnosis not present

## 2023-09-16 DIAGNOSIS — Z961 Presence of intraocular lens: Secondary | ICD-10-CM | POA: Diagnosis not present

## 2023-09-16 DIAGNOSIS — H52203 Unspecified astigmatism, bilateral: Secondary | ICD-10-CM | POA: Diagnosis not present

## 2023-09-16 NOTE — Progress Notes (Signed)
Please inform Dr. Perlie Gold of the MRI brain, without acute findings. Unchanged atrophy when compared to MRI 2021. Minimal chronic small vessel disease thanks .

## 2023-09-17 DIAGNOSIS — R2689 Other abnormalities of gait and mobility: Secondary | ICD-10-CM | POA: Diagnosis not present

## 2023-09-17 DIAGNOSIS — M6281 Muscle weakness (generalized): Secondary | ICD-10-CM | POA: Diagnosis not present

## 2023-09-17 DIAGNOSIS — G309 Alzheimer's disease, unspecified: Secondary | ICD-10-CM | POA: Diagnosis not present

## 2023-09-21 DIAGNOSIS — M6281 Muscle weakness (generalized): Secondary | ICD-10-CM | POA: Diagnosis not present

## 2023-09-21 DIAGNOSIS — R2689 Other abnormalities of gait and mobility: Secondary | ICD-10-CM | POA: Diagnosis not present

## 2023-09-21 DIAGNOSIS — G309 Alzheimer's disease, unspecified: Secondary | ICD-10-CM | POA: Diagnosis not present

## 2023-09-23 DIAGNOSIS — R2689 Other abnormalities of gait and mobility: Secondary | ICD-10-CM | POA: Diagnosis not present

## 2023-09-23 DIAGNOSIS — G309 Alzheimer's disease, unspecified: Secondary | ICD-10-CM | POA: Diagnosis not present

## 2023-09-23 DIAGNOSIS — M6281 Muscle weakness (generalized): Secondary | ICD-10-CM | POA: Diagnosis not present

## 2023-09-24 ENCOUNTER — Encounter: Payer: Self-pay | Admitting: Psychology

## 2023-09-24 ENCOUNTER — Ambulatory Visit (INDEPENDENT_AMBULATORY_CARE_PROVIDER_SITE_OTHER): Payer: Medicare Other | Admitting: Psychology

## 2023-09-24 DIAGNOSIS — G301 Alzheimer's disease with late onset: Secondary | ICD-10-CM

## 2023-09-24 DIAGNOSIS — F02A0 Dementia in other diseases classified elsewhere, mild, without behavioral disturbance, psychotic disturbance, mood disturbance, and anxiety: Secondary | ICD-10-CM | POA: Diagnosis not present

## 2023-09-24 NOTE — Progress Notes (Signed)
   Neuropsychology Feedback Session [name]. [facility] [place] Department of Neurology  Reason for Referral:   Jared Clark is a 87 y.o. right-handed Caucasian male referred by Marlowe Kays, PA-C, to characterize his current cognitive functioning and assist with diagnostic clarity and treatment planning in the context of subjective cognitive decline and concerns for an underlying neurodegenerative illness.   Feedback:   Jared Clark completed a comprehensive neuropsychological evaluation on 08/21/2023. Please refer to that encounter for the full report and recommendations. Briefly, results suggested variability but overall impairment surrounding verbal learning and memory. Additional impairments were exhibited across semantic fluency and confrontation naming, while a relative weakness was exhibited across attention/concentration (below average normative range). Regarding the etiology for his mild dementia presentation, I do feel that concerns for underlying Alzheimer's disease are reasonable. While there is some variability across memory performances as a whole, there is enough impairment overall to suggest concerns for both rapid forgetting and an evolving storage impairment. Variability rather than consistent, severe impairment is encouraging and would suggest that this disease process is in relatively early stages if truly present. Additional impairment surrounding semantic fluency and confrontation naming adds to diagnostic concerns as this would represent the hallmark pattern of disease progression in this illness. Overall, concerns for earlier stages of Alzheimer's disease remain.   Jared Clark was accompanied by his wife during the current feedback session. Content of the current session focused on the results of his neuropsychological evaluation. Jared Clark was given the opportunity to ask questions and his questions were answered. He was encouraged to reach out should  additional questions arise. A copy of his report was provided at the conclusion of the visit.      One unit [phone] was billed for Dr. Tammy Sours time spent preparing for, conducting, and documenting the current feedback session with Jared Clark.

## 2023-09-25 DIAGNOSIS — R2689 Other abnormalities of gait and mobility: Secondary | ICD-10-CM | POA: Diagnosis not present

## 2023-09-25 DIAGNOSIS — G309 Alzheimer's disease, unspecified: Secondary | ICD-10-CM | POA: Diagnosis not present

## 2023-09-25 DIAGNOSIS — M6281 Muscle weakness (generalized): Secondary | ICD-10-CM | POA: Diagnosis not present

## 2023-10-06 DIAGNOSIS — C44219 Basal cell carcinoma of skin of left ear and external auricular canal: Secondary | ICD-10-CM | POA: Diagnosis not present

## 2023-10-06 DIAGNOSIS — I8311 Varicose veins of right lower extremity with inflammation: Secondary | ICD-10-CM | POA: Diagnosis not present

## 2023-10-06 DIAGNOSIS — Z85828 Personal history of other malignant neoplasm of skin: Secondary | ICD-10-CM | POA: Diagnosis not present

## 2023-10-06 DIAGNOSIS — I872 Venous insufficiency (chronic) (peripheral): Secondary | ICD-10-CM | POA: Diagnosis not present

## 2023-10-06 DIAGNOSIS — I8312 Varicose veins of left lower extremity with inflammation: Secondary | ICD-10-CM | POA: Diagnosis not present

## 2023-10-06 DIAGNOSIS — L820 Inflamed seborrheic keratosis: Secondary | ICD-10-CM | POA: Diagnosis not present

## 2023-11-24 ENCOUNTER — Ambulatory Visit (INDEPENDENT_AMBULATORY_CARE_PROVIDER_SITE_OTHER): Payer: Medicare Other | Admitting: Physician Assistant

## 2023-11-24 ENCOUNTER — Encounter: Payer: Self-pay | Admitting: Physician Assistant

## 2023-11-24 VITALS — BP 137/87 | HR 81 | Resp 20 | Ht 76.0 in | Wt 230.0 lb

## 2023-11-24 DIAGNOSIS — G301 Alzheimer's disease with late onset: Secondary | ICD-10-CM

## 2023-11-24 DIAGNOSIS — F02A0 Dementia in other diseases classified elsewhere, mild, without behavioral disturbance, psychotic disturbance, mood disturbance, and anxiety: Secondary | ICD-10-CM | POA: Diagnosis not present

## 2023-11-24 NOTE — Progress Notes (Signed)
Assessment/Plan:    Mild Dementia likely due to Alzheimer's Disease, late onset     SAMAAD ORSBURN is a very pleasant 87 y.o. RH male with a history of Dr. Burnadette Peter is a very pleasant 87 y.o. year old RH male  retired Education officer, environmental physician  (2011), with a history of hypertension, hyperlipidemia, DM2, Carotid Artery Disease, GERD, Arthritis, CAD on  Plavix and known pituitary microadenoma per MRI 2021 and a diagnosis of mild dementia due to Alzheimer's disease, late onset, per neuropsych evaluation 08/2023 seen today in follow up for memory loss. Patient is currently on memantine 5 mg bid .  Memory is stable from prior visit.  He does not very active been instructed to increase activity as socialization.  He continues to drive short distances   Follow up in 6 months. Increase memantine to 10 mg bid, side effects discussed  Repeat neuropsych evaluation on 9-15 months for evaluation of disease trajectory Continue B12 supplements Recommend good control of her cardiovascular risk factors, he is on Plavix Continue to control mood as per PCP     Subjective:    This patient is accompanied in the office by his wife who supplements the history.  Previous records as well as any outside records available were reviewed prior to todays visit. Patient was last seen on 08/18/23 with [facility] 23/30   Any changes in memory since last visit? "My memory is about the same "patient has some difficulty remembering recent conversations, new information and especially people names. He likes to do  some jigsaw puzzles.  repeats oneself?  Endorsed Disoriented when walking into a room?  Patient denies   Leaving objects?  May misplace things but not in unusual places  Wandering behavior?  denies   Any personality changes since last visit?  "I may have some down moments".    Any worsening depression?:  Denies.   Hallucinations or paranoia?  Denies.   Seizures? denies    Any sleep changes? "Most of the night I sleep  well".  Occasional vivid dreams, denies REM behavior or sleepwalking   Sleep apnea?   Denies.   Any hygiene concerns? Denies.  Independent of bathing and dressing?  Endorsed  Does the patient needs help with medications?  Patient is in charge  Who is in charge of the finances?  Wife is in charge    Any changes in appetite? "Although I like ice cream, I am trying to eat healthier"    Patient have trouble swallowing? Denies.   Does the patient cook? No Any headaches?   denies   Chronic back pain  denies   Ambulates with difficulty? He does not like to walk much, "I am lazy so iI try to find excuses not to walk".    Recent falls or head injuries? Not recently   Unilateral weakness, numbness or tingling? Denies.   Any tremors?  Denies   Any anosmia?  Denies   Any incontinence of urine?  Denies  Any bowel dysfunction?  Intermittent diarrhea, needs diapers, increasing fiber in the diet.     Patient lives with wife at [facility]     Does the patient drive? Very short distances, visualizes the route. Became lost locally recently, stopped and visualized.  He does not venture out of town.   Neuropsych evaluation 09/24/23 Briefly, results suggested variability but overall impairment surrounding verbal learning and memory. Additional impairments were exhibited across semantic fluency and confrontation naming, while a relative weakness was exhibited across  attention/concentration (below average normative range). Regarding the etiology for his mild dementia presentation, I do feel that concerns for underlying Alzheimer's disease are reasonable. While there is some variability across memory performances as a whole, there is enough impairment overall to suggest concerns for both rapid forgetting and an evolving storage impairment. Variability rather than consistent, severe impairment is encouraging and would suggest that this disease process is in relatively early stages if truly present. Additional  impairment surrounding semantic fluency and confrontation naming adds to diagnostic concerns as this would represent the hallmark pattern of disease progression in this illness. Overall, concerns for earlier stages of Alzheimer's disease remain.    Initial visit 08/18/2023 How long did patient have memory difficulties? Patient has difficulty remembering recent conversations and people names, new information. "Names is a real problem, I have to use a system" likes to do crossword puzzles and  word finding, jigsaw puzzles.  He participates in some activities at [facility] [place].  repeats oneself?  Endorsed Disoriented when walking into a room?  Denies   Leaving objects in unusual places?   Denies.   Wandering behavior? Denies.   Any personality changes ? Denies.  Sometimes I feel down because I am not physically active, and also because my voice that becomes a loud whisper as the day progresses. Any history of depression?: I think in my life I had some depressive occasions but not a diagnosis of it Hallucinations or paranoia?  Denies.   Seizures? Denies.    Any sleep changes?   "Most of the time I sleep well ". Sometimes he has vivid dreams, REM behavior or sleepwalking   Sleep apnea? Denies.   Any hygiene concerns?  Denies.   Independent of bathing and dressing?  Endorsed  Does the patient need help with medications? Patient is in charge.   Who is in charge of the finances? Wife is in charge for the last year "she wants to be in charge"    Any changes in appetite?   Denies.  I love ice cream, trying to eat more vegetables and less potatoes, trying to lose some weight.    Patient have trouble swallowing?  denies   Does the patient cook?  No   Any headaches?  Denies.   Chronic back pain?  Denies.   Ambulates with difficulty? He does not walk "as much as I should", tries to avoid sweating    Recent falls or head injuries? 1 year ago, no loss of consciousness or injuries, but  now he avoids the  stairs to go up a floor to avoid any falls Vision changes? Denies. Uses glasses. Due for checkup in October  Stroke like symptoms?  Denies.   Any tremors?  Denies.   Any anosmia?  Denies.   Any incontinence of urine? Endorsed. Any bowel dysfunction? Endorsed, intermittent diarrhea. Needs diapers  Patient lives  at [facility].  History of heavy alcohol intake? Denies.   History of heavy tobacco use? Denies.   Family history of dementia?  ? Grandmother  Does patient drive? Yes only around town, visualizes the route.   Retired in 2011 from [employer].    MRI of the brain personally reviewed from October 2024 remarkable for unchanged generalized cerebral atrophy, minimal chronic small vessel disease for age, no acute findings  PREVIOUS MEDICATIONS:   CURRENT MEDICATIONS:  Outpatient Encounter Medications as of 11/24/2023  Medication Sig   aspirin 81 MG tablet Take 81 mg by mouth daily.   atorvastatin (LIPITOR) 40 MG  tablet TAKE 1 TABLET BY MOUTH DAILY   clopidogrel (PLAVIX) 75 MG tablet Take 75 mg by mouth daily.   DULoxetine (CYMBALTA) 30 MG capsule 1 capsule   meclizine (ANTIVERT) 25 MG tablet Take 25 mg by mouth 3 (three) times daily as needed. Takes one a day   memantine (NAMENDA) 5 MG tablet Take 1 tablet (5 mg at night) for 2 weeks, then increase to 1 tablet (5 mg) twice a day   metoprolol succinate (TOPROL-XL) 25 MG 24 hr tablet Take 12.5 mg by mouth daily.    naproxen sodium (ANAPROX) 220 MG tablet Take 220 mg by mouth daily.   nitroGLYCERIN (NITROSTAT) 0.4 MG SL tablet Place 0.4 mg under the tongue every 5 (five) minutes as needed for chest pain (x 3 tablets daily).   valACYclovir (VALTREX) 1000 MG tablet Take 1,000 mg by mouth daily as needed. Outbreak.   valsartan (DIOVAN) 160 MG tablet Take 160 mg by mouth daily.   vitamin B-12 (CYANOCOBALAMIN) 500 MCG tablet Take 500 mcg by mouth daily.   zolpidem (AMBIEN) 5 MG tablet Take 2.5 mg by mouth at bedtime as needed for sleep.     No facility-administered encounter medications on file as of 11/24/2023.        No data to display            08/18/2023   10:00 AM  Montreal Cognitive Assessment   Visuospatial/ Executive (0/5) 3  Naming (0/3) 3  Attention: Read list of digits (0/2) 2  Attention: Read list of letters (0/1) 1  Attention: Serial 7 subtraction starting at 100 (0/3) 2  Language: Repeat phrase (0/2) 1  Language : Fluency (0/1) 1  Abstraction (0/2) 2  Delayed Recall (0/5) 2  Orientation (0/6) 6  Total 23  Adjusted Score (based on education) 23    Objective:     PHYSICAL EXAMINATION:    VITALS:   Vitals:   11/24/23 1048  BP: 137/87  Pulse: 81  Resp: 20  SpO2: 96%  Weight: 230 lb (104.3 kg)  Height: 6\' 4"  (1.93 m)    GEN:  The patient appears stated age and is in NAD. HEENT:  Normocephalic, atraumatic.   Neurological examination:  General: NAD, well-groomed, appears stated age. Orientation: The patient is alert. Oriented to person, place and date Cranial nerves: There is good facial symmetry.The speech is fluent and clear. No aphasia or dysarthria. Fund of knowledge is appropriate. Recent and remote memory are impaired. Attention and concentration are reduced.  Able to name objects and unable to repeat phrases.  Hearing is intact to conversational tone.  Sensation: Sensation is intact to light touch throughout Motor: Strength is at least antigravity x4. DTR's 2/4 in UE/LE     Movement examination: Tone: There is normal tone in the UE/LE Abnormal movements:  no tremor.  No myoclonus.  No asterixis.   Coordination:  There is no decremation with RAM's. Normal finger to nose  Gait and Station: The patient has no difficulty arising out of a deep-seated chair without the use of the hands. The patient's stride length is good.  Gait is cautious and narrow.    Thank you for allowing [place] the opportunity to participate in the care of this nice patient. Please do not hesitate to contact  us for any questions or concerns.   Total time spent on today's visit was 45 minutes dedicated to this patient today, preparing to see patient, examining the patient, ordering tests and/or medications and counseling the  patient, documenting clinical information in the EHR or other health record, independently interpreting results and communicating results to the patient/family, discussing treatment and goals, answering patient's questions and coordinating care.  Cc:  Cleatis Polka., MD  Marlowe Kays 11/24/2023 11:12 AM

## 2023-11-24 NOTE — Patient Instructions (Addendum)
It was a pleasure to see you today at our office.   Recommendations:  iNCREASE ACTIVITY  Continue B12 supplements  Increase Memantine 10  mg tablets  1 tablet twice daily.     Follow up June 19 at 11:30    For psychiatric meds, mood meds: Please have your primary care physician manage these medications.  If you have any severe symptoms of a stroke, or other severe issues such as confusion,severe chills or fever, etc call 911 or go to the ER as you may need to be evaluated further     For assessment of decision of mental capacity and competency:  Call Dr. Erick Blinks, geriatric psychiatrist at [phone]  Counseling regarding caregiver distress, including caregiver depression, anxiety and issues regarding community resources, adult day care programs, adult living facilities, or memory care questions:  please contact your  Primary Doctor's Social Worker   Whom to call: Memory  decline, memory medications: Call our office [phone]    [email or web address]   RECOMMENDATIONS FOR ALL PATIENTS WITH MEMORY PROBLEMS: 1. Continue to exercise (Recommend 30 minutes of walking everyday, or 3 hours every week) 2. Increase social interactions - continue going to [place] and enjoy social gatherings with friends and family 3. Eat healthy, avoid fried foods and eat more fruits and vegetables 4. Maintain adequate blood pressure, blood sugar, and blood cholesterol level. Reducing the risk of stroke and cardiovascular disease also helps promoting better memory. 5. Avoid stressful situations. Live a simple life and avoid aggravations. Organize your time and prepare for the next day in anticipation. 6. Sleep well, avoid any interruptions of sleep and avoid any distractions in the bedroom that may interfere with adequate sleep quality 7. Avoid sugar, avoid sweets as there is a strong link between excessive sugar intake, diabetes, and cognitive  impairment We discussed the Mediterranean diet, which has been shown to help patients reduce the risk of progressive memory disorders and reduces cardiovascular risk. This includes eating fish, eat fruits and green leafy vegetables, nuts like almonds and hazelnuts, walnuts, and also use olive oil. Avoid fast foods and fried foods as much as possible. Avoid sweets and sugar as sugar use has been linked to worsening of memory function.  There is always a concern of gradual progression of memory problems. If this is the case, then we may need to adjust level of care according to patient needs. Support, both to the patient and caregiver, should then be put into place.         DRIVING: Regarding driving, in patients with progressive memory problems, driving will be impaired. We advise to have someone else do the driving if trouble finding directions or if minor accidents are reported. Independent driving assessment is available to determine safety of driving.   If you are interested in the driving assessment, you can contact the following:  [facility] [employer] in [place] [phone]  [facility] [phone]  [facility] [phone]  [facility] [phone] or [phone]   FALL PRECAUTIONS: Be cautious when walking. Scan the area for obstacles that may increase the risk of trips and falls. When getting up in the mornings, sit up at the edge of the bed for a few minutes before getting out of bed. Consider elevating the bed at the head end to avoid drop of blood pressure when getting up. Walk always in a well-lit room (use night lights in the walls). Avoid area rugs or power cords from appliances in the middle of the  walkways. Use a walker or a cane if necessary and consider physical therapy for balance exercise. Get your eyesight checked regularly.  FINANCIAL OVERSIGHT: Supervision, especially oversight when making financial decisions or transactions  is also recommended.  HOME SAFETY: Consider the safety of the kitchen when operating appliances like stoves, microwave oven, and blender. Consider having supervision and share cooking responsibilities until no longer able to participate in those. Accidents with firearms and other hazards in the house should be identified and addressed as well.   ABILITY TO BE LEFT ALONE: If patient is unable to contact 911 operator, consider using LifeLine, or when the need is there, arrange for someone to stay with patients. Smoking is a fire hazard, consider supervision or cessation. Risk of wandering should be assessed by caregiver and if detected at any point, supervision and safe proof recommendations should be instituted.  MEDICATION SUPERVISION: Inability to self-administer medication needs to be constantly addressed. Implement a mechanism to ensure safe administration of the medications.      Mediterranean Diet A Mediterranean diet refers to food and lifestyle choices that are based on the traditions of countries located on the [employer]. This way of eating has been shown to help prevent certain conditions and improve outcomes for people who have chronic diseases, like kidney disease and heart disease. What are tips for following this plan? Lifestyle  Cook and eat meals together with your family, when possible. Drink enough fluid to keep your urine clear or pale yellow. Be physically active every day. This includes: Aerobic exercise like running or swimming. Leisure activities like gardening, walking, or housework. Get 7-8 hours of sleep each night. If recommended by your health care provider, drink red wine in moderation. This means 1 glass a day for nonpregnant women and 2 glasses a day for men. A glass of wine equals 5 oz (150 mL). Reading food labels  Check the serving size of packaged foods. For foods such as rice and pasta, the serving size refers to the amount of cooked product, not  dry. Check the total fat in packaged foods. Avoid foods that have saturated fat or trans fats. Check the ingredients list for added sugars, such as corn syrup. Shopping  At the grocery store, buy most of your food from the areas near the walls of the store. This includes: Fresh fruits and vegetables (produce). Grains, beans, nuts, and seeds. Some of these may be available in unpackaged forms or large amounts (in bulk). Fresh seafood. Poultry and eggs. Low-fat dairy products. Buy whole ingredients instead of prepackaged foods. Buy fresh fruits and vegetables in-season from local farmers markets. Buy frozen fruits and vegetables in resealable bags. If you do not have access to quality fresh seafood, buy precooked frozen shrimp or canned fish, such as tuna, salmon, or sardines. Buy small amounts of raw or cooked vegetables, salads, or olives from the deli or salad bar at your store. Stock your pantry so you always have certain foods on hand, such as olive oil, canned tuna, canned tomatoes, rice, pasta, and beans. Cooking  Cook foods with extra-virgin olive oil instead of using butter or other vegetable oils. Have meat as a side dish, and have vegetables or grains as your main dish. This means having meat in small portions or adding small amounts of meat to foods like pasta or stew. Use beans or vegetables instead of meat in common dishes like chili or lasagna. Experiment with different cooking methods. Try roasting or broiling vegetables instead of steaming  or sauteing them. Add frozen vegetables to soups, stews, pasta, or rice. Add nuts or seeds for added healthy fat at each meal. You can add these to yogurt, salads, or vegetable dishes. Marinate fish or vegetables using olive oil, lemon juice, garlic, and fresh herbs. Meal planning  Plan to eat 1 vegetarian meal one day each week. Try to work up to 2 vegetarian meals, if possible. Eat seafood 2 or more times a week. Have healthy snacks  readily available, such as: Vegetable sticks with hummus. Greek yogurt. Fruit and nut trail mix. Eat balanced meals throughout the week. This includes: Fruit: 2-3 servings a day Vegetables: 4-5 servings a day Low-fat dairy: 2 servings a day Fish, poultry, or lean meat: 1 serving a day Beans and legumes: 2 or more servings a week Nuts and seeds: 1-2 servings a day Whole grains: 6-8 servings a day Extra-virgin olive oil: 3-4 servings a day Limit red meat and sweets to only a few servings a month What are my food choices? Mediterranean diet Recommended Grains: Whole-grain pasta. Brown rice. Bulgar wheat. Polenta. Couscous. Whole-wheat bread. Orpah Cobb. Vegetables: Artichokes. Beets. Broccoli. Cabbage. Carrots. Eggplant. Green beans. Chard. Kale. Spinach. Onions. Leeks. Peas. Squash. Tomatoes. Peppers. Radishes. Fruits: Apples. Apricots. Avocado. Berries. Bananas. Cherries. Dates. Figs. Grapes. Lemons. Melon. Oranges. Peaches. Plums. Pomegranate. Meats and other protein foods: Beans. Almonds. Sunflower seeds. Pine nuts. Peanuts. Cod. Salmon. Scallops. Shrimp. Tuna. Tilapia. Clams. Oysters. Eggs. Dairy: Low-fat milk. Cheese. Greek yogurt. Beverages: Water. Red wine. Herbal tea. Fats and oils: Extra virgin olive oil. Avocado oil. Grape seed oil. Sweets and desserts: [place] yogurt with honey. Baked apples. Poached pears. Trail mix. Seasoning and other foods: Basil. Cilantro. Coriander. Cumin. Mint. Parsley. Sage. Rosemary. Tarragon. Garlic. Oregano. Thyme. Pepper. Balsalmic vinegar. Tahini. Hummus. Tomato sauce. Olives. Mushrooms. Limit these Grains: Prepackaged pasta or rice dishes. Prepackaged cereal with added sugar. Vegetables: Deep fried potatoes (french fries). Fruits: Fruit canned in syrup. Meats and other protein foods: Beef. Pork. Lamb. Poultry with skin. Hot dogs. Tomasa Blase. Dairy: Ice cream. Sour cream. Whole milk. Beverages: Juice. Sugar-sweetened soft drinks. Beer. Liquor and  spirits. Fats and oils: Butter. Canola oil. Vegetable oil. Beef fat (tallow). Lard. Sweets and desserts: Cookies. Cakes. Pies. Candy. Seasoning and other foods: Mayonnaise. Premade sauces and marinades. The items listed may not be a complete list. Talk with your dietitian about what dietary choices are right for you. Summary The Mediterranean diet includes both food and lifestyle choices. Eat a variety of fresh fruits and vegetables, beans, nuts, seeds, and whole grains. Limit the amount of red meat and sweets that you eat. Talk with your health care provider about whether it is safe for you to drink red wine in moderation. This means 1 glass a day for nonpregnant women and 2 glasses a day for men. A glass of wine equals 5 oz (150 mL). This information is not intended to replace advice given to you by your health care provider. Make sure you discuss any questions you have with your health care provider. Document Released: 07/17/2016 Document Revised: 08/19/2016 Document Reviewed: 07/17/2016 Elsevier Interactive Patient Education  2017 [employer].     [facility] [phone]

## 2024-01-26 DIAGNOSIS — H8110 Benign paroxysmal vertigo, unspecified ear: Secondary | ICD-10-CM | POA: Diagnosis not present

## 2024-01-26 DIAGNOSIS — I6529 Occlusion and stenosis of unspecified carotid artery: Secondary | ICD-10-CM | POA: Diagnosis not present

## 2024-01-26 DIAGNOSIS — G300 Alzheimer's disease with early onset: Secondary | ICD-10-CM | POA: Diagnosis not present

## 2024-01-26 DIAGNOSIS — R159 Full incontinence of feces: Secondary | ICD-10-CM | POA: Diagnosis not present

## 2024-01-26 DIAGNOSIS — E785 Hyperlipidemia, unspecified: Secondary | ICD-10-CM | POA: Diagnosis not present

## 2024-01-26 DIAGNOSIS — F3341 Major depressive disorder, recurrent, in partial remission: Secondary | ICD-10-CM | POA: Diagnosis not present

## 2024-01-26 DIAGNOSIS — I2510 Atherosclerotic heart disease of native coronary artery without angina pectoris: Secondary | ICD-10-CM | POA: Diagnosis not present

## 2024-01-26 DIAGNOSIS — N1831 Chronic kidney disease, stage 3a: Secondary | ICD-10-CM | POA: Diagnosis not present

## 2024-01-26 DIAGNOSIS — F02A0 Dementia in other diseases classified elsewhere, mild, without behavioral disturbance, psychotic disturbance, mood disturbance, and anxiety: Secondary | ICD-10-CM | POA: Diagnosis not present

## 2024-01-26 DIAGNOSIS — K5909 Other constipation: Secondary | ICD-10-CM | POA: Diagnosis not present

## 2024-01-26 DIAGNOSIS — I129 Hypertensive chronic kidney disease with stage 1 through stage 4 chronic kidney disease, or unspecified chronic kidney disease: Secondary | ICD-10-CM | POA: Diagnosis not present

## 2024-01-26 DIAGNOSIS — R7301 Impaired fasting glucose: Secondary | ICD-10-CM | POA: Diagnosis not present

## 2024-01-28 ENCOUNTER — Other Ambulatory Visit: Payer: Self-pay | Admitting: Cardiology

## 2024-02-15 NOTE — Progress Notes (Unsigned)
 CARDIOLOGY OFFICE NOTE  Date:  02/17/2024    Jared Clark Date of Birth: [birth year] Medical Record #[id number]  PCP:  Cleatis Polka., MD  Cardiologist:  Quinto Tippy [place]  MD  Chief Complaint  Patient presents with   Chest Pain    History of Present Illness: Jared Clark is a 88 y.o. male who is seen for follow up CAD. Other issues include HTN, HLD, GERD, carotid disease and obesity.   In September of 2003 he underwent stenting of his first obtuse marginal vessel. He had a repeat cardiac catheterization in March 2010 after an abnormal stress Cardiolite. The cardiac catheterization showed long term patency of the prior stent and nonobstructive atherosclerotic disease elsewhere and he had normal left ventricular function.   Seen as a work in on 01/25/14 because of recurring chest pain. He had LexiScan Myoview. This showed significant reversible anterolateral ischemia.  On 02/10/14 the patient underwent cardiac catheterization with successful stenting of the second diagonal with a drug-eluting stent. He was seen in September 2016 complaining of atypical chest discomfort. Because of his atypical chest pain the patient underwent a myocardial perfusion scan 08/2015 which showed no ischemia and normal EF. It was felt to be a low risk study.  Seen again in June 2017 with atypical chest pain and referred for cardiac cath which was unchanged.   Seen in May 2018 of chest pain. Myoview study showed a small inferior and apical area of ischemia. EF lower at 47%. We recommended repeat cardiac cath. This showed nonobstructive CAD that was unchanged from prior studies. LV function was normal. EDP elevated 26-27 mm Hg.   In June 2020 he presented with chest pain. Cardiac catheterization was completed on 05/11/2019 revealing nonobstructive CAD unchanged from previous cath back in May 2018 with normal LV function and LVEDP.  He was to be continued on medical management with Plavix, statin therapy,  beta-blocker, and ARB.  Ranexa was initiated at 500 mg twice daily for possible microvascular angina.   He is seen today with his wife. He is living at [facility]. He is very sedentary.  He  has been diagnosed with mild Alzheimers. Now on Namenda.  He reports some chest pain mainly in the left breast area but it migrates around to different areas. Only last 2-3 minutes. No dyspnea. Nothing sustained. Sometimes if he presses on it this will reproduce pain.  Past Medical History:  Diagnosis Date   Abnormal nuclear stress test 02/07/2014   Benign hypertensive heart disease without heart failure 08/22/2011   Benign prostate hyperplasia 03/19/2011   S/P TURP 1995   Bilateral carotid artery disease 08/29/2015   a. Carotid duplex in 05/2013: 40-59% RICA, <40% LICA. b. Per Patient, doppler 05/2015 showed 1-39% on the R - being folloewd by primary care.   Borderline diabetes    diet controlled   CAD S/P percutaneous coronary angioplasty 10/02/2015   Cataract of right eye    Chest pain with moderate risk for cardiac etiology 05/22/2016   Closed fracture of distal end of left fibula 10/21/2018   Coronary artery disease    a. 2003 s/p PCI/stenting; b. 02/2014 Abnl nuc -> Cath/PCI: LM nl, LAD nl, D2 95p (2.5x12 Promus DES), LCX <20, OM1 patent stent, RCA 50d, EF 55-65%. c. 08/2015: normal nuc.   Dyslipidemia    Essential hypertension 10/02/2015   GERD (gastroesophageal reflux disease)    Herpes 11/22/2012   Hives 12/01/2017   Ischemic heart disease 03/19/2011  Treadmill Myoview on 08/18/12 showed a possible small scar in the basal inferolateral wall but no reversible ischemia and his ejection fraction was 57%     Major depressive disorder    Malaise and fatigue 03/19/2011   Mild dementia, concerns for Alzheimer's disease 08/21/2023   Osteoarthritis    right ankle, bilateral wrist, back   Sinus bradycardia    Syncope 12/01/2017    Past Surgical History:  Procedure Laterality Date   BACK SURGERY      CARDIAC CATHETERIZATION  02-06-09   NO INTERVENTION   CARDIAC CATHETERIZATION N/A 05/22/2016   Procedure: Left Heart Cath and Coronary Angiography;  Surgeon: Charisa Twitty M [place], MD;  Location: [facility] INVASIVE CV LAB;  Service: Cardiovascular;  Laterality: N/A;   CAROTID STENT     CERVICAL DISCECTOMY  1989   C4 - 5   CHOLECYSTECTOMY  2002   W/ UMBILICAL HERNIA REPAIR   CORONARY ANGIOPLASTY  2003- FIRST OBTUSE MARGIANL VESSEL    X1 CYPHER STENT   CORONARY STENT PLACEMENT     DES to first diagonal        DR [place]   EYE SURGERY     HERNIA REPAIR  2002   Umbilical   INGUINAL HERNIA REPAIR  1985   KNEE ARTHROSCOPY  11/05/2011   Procedure: ARTHROSCOPY KNEE;  Surgeon: Loanne Drilling;  Location: [facility];  Service: Orthopedics;  Laterality: Right;  RIGHT ARTHROSCOPY KNEE WITH DEBRIDEMENT, Chondroplasty   LEFT HEART CATH AND CORONARY ANGIOGRAPHY N/A 05/01/2017   Procedure: Left Heart Cath and Coronary Angiography;  Surgeon: [place], Sanaya Gwilliam M, MD;  Location: [facility] INVASIVE CV LAB;  Service: Cardiovascular;  Laterality: N/A;   LEFT HEART CATH AND CORONARY ANGIOGRAPHY N/A 05/11/2019   Procedure: LEFT HEART CATH AND CORONARY ANGIOGRAPHY;  Surgeon: [place], Mathew Postiglione M, MD;  Location: [facility] INVASIVE CV LAB;  Service: Cardiovascular;  Laterality: N/A;   LEFT HEART CATHETERIZATION WITH CORONARY ANGIOGRAM N/A 02/10/2014   Procedure: LEFT HEART CATHETERIZATION WITH CORONARY ANGIOGRAM;  Surgeon: Talise Sligh M [place], MD;  Location: [facility] CATH LAB;  Service: Cardiovascular;  Laterality: N/A;   LUMBAR LAMINECTOMY  1973   L4-S1   PILONIDAL CYST EXCISION  1961   SHOULDER ARTHROSCOPY     Bilateral   TONSILLECTOMY AND ADENOIDECTOMY  CHILD   TRANSURETHRAL RESECTION OF PROSTATE  1995     Medications: Current Outpatient Medications  Medication Sig Dispense Refill   atorvastatin (LIPITOR) 40 MG tablet TAKE 1 TABLET BY MOUTH DAILY 90 tablet 3   clopidogrel (PLAVIX) 75 MG tablet Take 75 mg by mouth daily.     memantine  (NAMENDA) 5 MG tablet Take 1 tablet (5 mg at night) for 2 weeks, then increase to 1 tablet (5 mg) twice a day 60 tablet 11   metoprolol succinate (TOPROL-XL) 25 MG 24 hr tablet Take 12.5 mg by mouth daily.      valsartan (DIOVAN) 160 MG tablet Take 160 mg by mouth daily.     vitamin B-12 (CYANOCOBALAMIN) 500 MCG tablet Take 500 mcg by mouth daily.     nitroGLYCERIN (NITROSTAT) 0.4 MG SL tablet Place 0.4 mg under the tongue every 5 (five) minutes as needed for chest pain (x 3 tablets daily). (Patient not taking: Reported on 02/17/2024)     No current facility-administered medications for this visit.    Allergies: Allergies  Allergen Reactions   Flexeril [Cyclobenzaprine] Swelling   Methocarbamol Swelling   Orphenadrine Citrate Swelling   Lisinopril Cough   Ramipril Other (See  Comments)    DRY COUGH    Social History: The patient  reports that he has never smoked. He has never used smokeless tobacco. He reports that he does not currently use alcohol. He reports that he does not use drugs.   Family History: The patient's family history includes Hypertension in his mother; Lung cancer in his father; Memory loss in his maternal grandmother.   Review of Systems: Please see the history of present illness.   Otherwise, the review of systems is positive for none.   All other systems are reviewed and negative.   Physical Exam: VS:  BP 132/60   Pulse (!) 56   Ht 6\' 3"  (1.905 m)   Wt 228 lb (103.4 kg)   SpO2 93%   BMI 28.50 kg/m  .  BMI Body mass index is 28.5 kg/m.  Wt Readings from Last 3 Encounters:  02/17/24 228 lb (103.4 kg)  11/24/23 230 lb (104.3 kg)  08/18/23 229 lb (103.9 kg)    GENERAL:  Well appearing WM in NAD HEENT:  PERRL, EOMI, sclera are clear. Oropharynx is clear. NECK:  No jugular venous distention, carotid upstroke brisk and symmetric, no bruits, no thyromegaly or adenopathy LUNGS:  Clear to auscultation bilaterally CHEST:  Unremarkable HEART:  RRR,  PMI not  displaced or sustained,S1 and S2 within normal limits, no S3, no S4: no clicks, no rubs, no murmurs ABD:  Soft, nontender. BS +, no masses or bruits. No hepatomegaly, no splenomegaly EXT:  2 + pulses throughout, no edema, no cyanosis no clubbing SKIN:  Warm and dry.  No rashes NEURO:  Alert and oriented x 3. Cranial nerves II through XII intact. PSYCH:  Cognitively intact     LABORATORY DATA:  EKG Interpretation Date/Time:  Wednesday February 17 2024 15:16:10 EDT Ventricular Rate:  56 PR Interval:  164 QRS Duration:  140 QT Interval:  450 QTC Calculation: 434 R Axis:   -40  Text Interpretation: Sinus bradycardia Left axis deviation Non-specific intra-ventricular conduction block Minimal voltage criteria for LVH, may be normal variant ( Cornell product ) Cannot rule out Septal infarct (cited on or before 11-May-2019) T wave abnormality, consider lateral ischemia When compared with ECG of March 11,2024  No significant change was found Confirmed by [place], Bereket Gernert [phone]) on 02/17/2024 3:25:22 PM     Lab Results  Component Value Date   WBC 5.1 05/11/2019   HGB 15.5 05/11/2019   HCT 46.8 05/11/2019   PLT 178 05/11/2019   GLUCOSE 79 05/12/2019   CHOL 88 05/12/2019   TRIG 75 05/12/2019   HDL 34 (L) 05/12/2019   LDLCALC 39 05/12/2019   ALT 22 12/01/2017   AST 31 12/01/2017   NA 137 05/12/2019   K 4.0 05/12/2019   CL 107 05/12/2019   CREATININE 0.97 05/12/2019   BUN 13 05/12/2019   CO2 23 05/12/2019   INR 1.0 04/27/2017   Dated 06/17/18: cholesterol 97, triglycerides 62, HDL 43, LDL 42. A1c 5%. CBC, CMET and TSH normal.  Dated 06/21/19: cholesterol 108, triglycerides 63, HDL 43, LDL 52, A1c 4.8%. creatinine 1.2. otherwise CMET, CBC, TSH normal Dated 07/10/20: cholesterol 102, triglycerides 67, HDL 43, LDL 46. A1c 4.9%. creatinine 1.3. otherwise CMET and CBC normal. Dated 06/06/21: cholesterol 120, triglycerides 68, HDL 43, LDL 63, A1c 5%, CMET and TSH normal Dated 07/03/22:  cholesterol 105, triglycerides 89, HDL 58, LDL 29. A1c 5%. Creatinine 1.3. otherwise CMET and TSH normal.  Dated 07/15/23: cholesterol 100, triglycerides 65, HDL 40,  LDL 47. CMET and TSH normal  BNP (last 3 results) No results for input(s): "BNP" in the last 8760 hours.  ProBNP (last 3 results) No results for input(s): "PROBNP" in the last 8760 hours.   Other Studies Reviewed Today:  Myoview: 04/23/17: Study Highlights     The left ventricular ejection fraction is mildly decreased (45-54%). Nuclear stress EF: 47%. There was no ST segment deviation noted during stress. Defect 1: There is a small defect of moderate severity present in the mid inferior and apical inferior location. Findings consistent with ischemia. This is an intermediate risk study.   There is small area mild severity reversible defect in the mid and apical inferior walls (SDS =2). LVEF is mildly decreased at 47%   Cardiac cath: 05/01/17: Conclusion     Prox RCA lesion, 20 %stenosed. Dist RCA lesion, 50 %stenosed. 1st Mrg lesion, 0 %stenosed. 1st Diag lesion, 0 %stenosed. Ost 1st Mrg to 1st Mrg lesion, 20 %stenosed. Prox Cx to Mid Cx lesion, 20 %stenosed. The left ventricular systolic function is normal. LV end diastolic pressure is moderately elevated. The left ventricular ejection fraction is 55-65% by visual estimate.   1. Nonobstructive CAD 2. Normal LV function 3. Elevated LVEDP   Plan: compared to last angiogram there is no significant change. Will continue medical therapy and consider other causes of chest pain.      Cath: 05/11/19   Non-stenotic 1st Diag lesion was previously treated. Prox Cx to Mid Cx lesion is 20% stenosed. Non-stenotic 1st Mrg lesion was previously treated. Ost 1st Mrg to 1st Mrg lesion is 20% stenosed. Prox RCA lesion is 20% stenosed. Dist RCA lesion is 50% stenosed. The left ventricular systolic function is normal. LV end diastolic pressure is normal. The left ventricular  ejection fraction is 55-65% by visual estimate.   1. Nonobstructive CAD- unchanged from May 2018 2. Normal LV function 3. Normal LVEDP   Plan: no new findings to explain his chest pain. Continue medical therapy  Echo 05/12/19: IMPRESSIONS      1. The left ventricle has normal systolic function with an ejection fraction of 60-65%. The cavity size was normal. There is moderate concentric left ventricular hypertrophy. Left ventricular diastolic Doppler parameters are consistent with impaired  relaxation.  2. The right ventricle has normal systolic function. The cavity was normal. There is no increase in right ventricular wall thickness.  3. Mild thickening of the mitral valve leaflet.  4. The aortic valve is tricuspid. Moderate thickening of the aortic valve. Moderate calcification of the aortic valve. Aortic valve regurgitation is mild by color flow Doppler. Mild stenosis of the aortic valve.d  Assessment/Plan: 1. CAD with first stent in the 1st OM back in 2003- [facility]; DES to 2nd DX in March 2015. Negative Myoview from 08/2015.   Last cardiac cath in June 2020 was unchanged.  His current symptoms are more c/w musculoskeletal pain. We previously stopped Ranexa without ill effects. Encourage increase in activity.   2. HTN - BP is well controlled on his current therapy.   3. HLD - on  statin. Excellent control. LDL 47  4. Carotid disease with  stable doppler study in March 2024 with 40-59% RICA disease. This hasn't changed in > 10 years  5. Obesity/deconditioning - encourage increased aerobic activity.  Follow up in one year.  Orders Placed This Encounter  Procedures   EKG 12-Lead    Signed: Kerrie Latour [place] MD, [facility]  02/17/2024 3:42 PM  [facility]

## 2024-02-17 ENCOUNTER — Ambulatory Visit: Payer: Medicare Other | Attending: Cardiology | Admitting: Cardiology

## 2024-02-17 ENCOUNTER — Encounter: Payer: Self-pay | Admitting: Cardiology

## 2024-02-17 VITALS — BP 132/60 | HR 56 | Ht 75.0 in | Wt 228.0 lb

## 2024-02-17 DIAGNOSIS — I10 Essential (primary) hypertension: Secondary | ICD-10-CM | POA: Diagnosis present

## 2024-02-17 DIAGNOSIS — I25119 Atherosclerotic heart disease of native coronary artery with unspecified angina pectoris: Secondary | ICD-10-CM | POA: Insufficient documentation

## 2024-02-17 NOTE — Patient Instructions (Signed)
 Medication Instructions:  Continue same medications *If you need a refill on your cardiac medications before your next appointment, please call your pharmacy*   Lab Work: None ordered   Testing/Procedures: None ordered   Follow-Up: At [facility], you and your health needs are our priority.  As part of our continuing mission to provide you with exceptional heart care, we have created designated Provider Care Teams.  These Care Teams include your primary Cardiologist (physician) and Advanced Practice Providers (APPs -  Physician Assistants and Nurse Practitioners) who all work together to provide you with the care you need, when you need it.  We recommend signing up for the patient portal called "MyChart".  Sign up information is provided on this After Visit Summary.  MyChart is used to connect with patients for Virtual Visits (Telemedicine).  Patients are able to view lab/test results, encounter notes, upcoming appointments, etc.  Non-urgent messages can be sent to your provider as well.   To learn more about what you can do with MyChart, go to [email or web address].    Your next appointment:  1 year    Call in Nov to schedule March appointment     Provider:  Dr.Jordan

## 2024-03-02 ENCOUNTER — Encounter: Payer: Self-pay | Admitting: Gastroenterology

## 2024-03-15 ENCOUNTER — Ambulatory Visit (HOSPITAL_COMMUNITY)
Admission: RE | Admit: 2024-03-15 | Discharge: 2024-03-15 | Disposition: A | Discharge status: Home / Self Care | Source: Ambulatory Visit | Attending: Cardiology | Admitting: Cardiology

## 2024-03-15 DIAGNOSIS — I6521 Occlusion and stenosis of right carotid artery: Secondary | ICD-10-CM | POA: Diagnosis not present

## 2024-03-29 NOTE — Progress Notes (Signed)
 "  Chief Complaint: fecal incontinence Primary GI MD: Sampson  HPI: 88 year old male history of BPH s/p TURP, CAD (on Plavix ), hypertension, hyperlipidemia, GERD, obesity, presents for evaluation of fecal incontinence  Follows with Dr. Peter Jordan cardiology.  History of cardiac stent placement 2003 with repeat cardiac catheterization 2010 showing patency of prior stent and nonobstructive atherosclerotic disease.  Cardiac cath June 2020 with nonobstructive CAD unchanged from previous cath.  On Plavix , statin, beta-blocker.  Echocardiogram with EF 60 to 65%  Recently diagnosed with mild Alzheimer's dementia September 2024.  Referred here by PCP for fecal incontinence.  Doing Metamucil Gummies and having daily formed stools and less eat certain foods  Discussed the use of AI scribe software for clinical note transcription with the patient, who gave verbal consent to proceed.  History of Present Illness He has experienced fecal incontinence over the last couple of years, with a notable worsening since October. Initially, he would occasionally leak fluid when sneezing, but more recently, he has had episodes where he could not make it to the toilet in time, resulting in significant leakage. The leakage is described as a brownish-yellow fluid with stool particles, and sometimes thicker, like a pie filling. He uses liners and folded toilet paper for absorption.  He has been using Metamucil gummies for fiber supplementation but is unsure of his effectiveness. He also mentions using an antidiarrheal medication, which he takes in half doses, effectively stopping bowel movements for about five days. He is uncertain if this worsens his incontinence. He recalls taking a probiotic, Align, which he stopped a few months ago, and notes that his symptoms began after discontinuing it.  He describes intermittent abdominal discomfort, primarily in the right lower quadrant, which he associates with past surgical  procedures, including a hernia repair and gallbladder removal. The discomfort is described as a round area of soreness that can be exacerbated by movement or pressure, but it is not associated with eating or bowel movements. No specific worsening of incontinence with antidiarrheal use. Reports formed bowel movements at times.  Patient states he cannot remember if he has had a colonoscopy in the past.  No family history of colon cancer.  Patient is a retired OB/GYN   PREVIOUS GI WORKUP   No previous colonoscopy in system  Past Medical History:  Diagnosis Date   Abnormal nuclear stress test 02/07/2014   Benign hypertensive heart disease without heart failure 08/22/2011   Benign prostate hyperplasia 03/19/2011   S/P TURP 1995   Bilateral carotid artery disease 08/29/2015   a. Carotid duplex in 05/2013: 40-59% RICA, <40% LICA. b. Per Patient, doppler 05/2015 showed 1-39% on the R - being folloewd by primary care.   Borderline diabetes    diet controlled   CAD S/P percutaneous coronary angioplasty 10/02/2015   Cataract of right eye    Chest pain with moderate risk for cardiac etiology 05/22/2016   Closed fracture of distal end of left fibula 10/21/2018   Coronary artery disease    a. 2003 s/p PCI/stenting; b. 02/2014 Abnl nuc -> Cath/PCI: LM nl, LAD nl, D2 95p (2.5x12 Promus DES), LCX <20, OM1 patent stent, RCA 50d, EF 55-65%. c. 08/2015: normal nuc.   Depression    Dyslipidemia    Essential hypertension 10/02/2015   GERD (gastroesophageal reflux disease)    Herpes 11/22/2012   Hives 12/01/2017   Ischemic heart disease 03/19/2011   Treadmill Myoview  on 08/18/12 showed a possible small scar in the basal inferolateral wall but no reversible ischemia  and his ejection fraction was 57%     Major depressive disorder    Malaise and fatigue 03/19/2011   Mild dementia, concerns for Alzheimer's disease 08/21/2023   Osteoarthritis    right ankle, bilateral wrist, back   Sinus bradycardia     Syncope 12/01/2017    Past Surgical History:  Procedure Laterality Date   BACK SURGERY     CARDIAC CATHETERIZATION  02-06-09   NO INTERVENTION   CARDIAC CATHETERIZATION N/A 05/22/2016   Procedure: Left Heart Cath and Coronary Angiography;  Surgeon: Peter M Jordan, MD;  [facility]VAS[facility] CV LAB;  Service: Cardiovascular;  Laterality: N/A;   CAROTID STENT     CERVICAL DISCECTOMY  1989   C4 - 5   CHOLECYSTECTOMY  2002   W/ UMBILICAL HERNIA REPAIR   CORONARY ANGIOPLASTY  2003- FIRST OBTUSE MARGIANL VESSEL    X1 CYPHER STENT   CORONARY STENT PLACEMENT     DES to first diagonal        DR JORDAN   EYE SURGERY     HERNIA REPAIR  2002   Umbilical   INGUINAL HERNIA REPAIR  1985   KNEE ARTHROSCOPY  11/05/2011   Procedure: ARTHROSCOPY KNEE;  Surgeon: Dempsey LULLA Moan;  Location: [facility];  Service: Orthopedics;  Laterality: Right;  RIGHT ARTHROSCOPY KNEE WITH DEBRIDEMENT, Chondroplasty   LEFT HEART CATH AND CORONARY ANGIOGRAPHY N/A 05/01/2017   Procedure: Left Heart Cath and Coronary Angiography;  Surgeon: Jordan, Peter M, MD;  Location: [facility] INVASIVE CV LAB;  Service: Cardiovascular;  Laterality: N/A;   LEFT HEART CATH AND CORONARY ANGIOGRAPHY N/A 05/11/2019   Procedure: LEFT HEART CATH AND CORONARY ANGIOGRAPHY;  Surgeon: Jordan, Peter M, MD;  Location: [facility] INVASIVE CV LAB;  Service: Cardiovascular;  Laterality: N/A;   LEFT HEART CATHETERIZATION WITH CORONARY ANGIOGRAM N/A 02/10/2014   Procedure: LEFT HEART CATHETERIZATION WITH CORONARY ANGIOGRAM;  Surgeon: Peter M Jordan, MD;  Location: [facility] CATH LAB;  Service: Cardiovascular;  Laterality: N/A;   LUMBAR LAMINECTOMY  1973   L4-S1   PILONIDAL CYST EXCISION  1961   SHOULDER ARTHROSCOPY     Bilateral   TONSILLECTOMY AND ADENOIDECTOMY  CHILD   TRANSURETHRAL RESECTION OF PROSTATE  1995    Current Outpatient Medications  Medication Sig Dispense Refill   atorvastatin  (LIPITOR ) 40 MG tablet TAKE 1 TABLET BY MOUTH DAILY 90 tablet 3    clopidogrel  (PLAVIX ) 75 MG tablet Take 75 mg by mouth daily.     hydrocortisone  (ANUSOL -HC) 2.5 % rectal cream Place 1 Application rectally 2 (two) times daily. 21 g 1   memantine  (NAMENDA ) 5 MG tablet Take 1 tablet (5 mg at night) for 2 weeks, then increase to 1 tablet (5 mg) twice a day 60 tablet 11   metoprolol  succinate (TOPROL -XL) 25 MG 24 hr tablet Take 12.5 mg by mouth daily.      nitroGLYCERIN  (NITROSTAT ) 0.4 MG SL tablet Place 0.4 mg under the tongue every 5 (five) minutes as needed for chest pain (x 3 tablets daily).     sertraline (ZOLOFT) 50 MG tablet Take 50 mg by mouth daily.     valsartan (DIOVAN) 160 MG tablet Take 160 mg by mouth daily.     vitamin B-12 (CYANOCOBALAMIN ) 500 MCG tablet Take 500 mcg by mouth daily.     No current facility-administered medications for this visit.    Allergies as of 03/30/2024 - Review Complete 03/30/2024  Allergen Reaction Noted   Flexeril [cyclobenzaprine] Swelling 03/09/2014  Methocarbamol Swelling    Orphenadrine citrate Swelling    Lisinopril Cough 12/01/2017   Ramipril  Other (See Comments) 05/17/2014    Family History  Problem Relation Age of Onset   Hypertension Mother    Lung cancer Father    Memory loss Maternal Grandmother    Heart attack Neg Hx    Stroke Neg Hx     Social History   Socioeconomic History   Marital status: Married    Spouse name: Tabari Volkert   Number of children: 1   Years of education: 20   Highest education level: Professional school degree (e.g., MD, DDS, DVM, JD)  Occupational History   Occupation: Retired    Comment: OBGYN physician  Tobacco Use   Smoking status: Never   Smokeless tobacco: Never  Vaping Use   Vaping status: Never Used  Substance and Sexual Activity   Alcohol  use: Yes    Comment: Rarely   Drug use: No   Sexual activity: Not Currently    Partners: Female    Comment: MARRIED  Other Topics Concern   Not on file  Social History Narrative   Right handed   Drinks  caffeine   Lives with wife at [place]   Retired physician with [employer]      Social Drivers of Health   Financial Resource Strain: Low Risk  (05/11/2019)   Overall Financial Resource Strain (CARDIA)    Difficulty of Paying Living Expenses: Not very hard  Food Insecurity: Unknown (05/11/2019)   Hunger Vital Sign    Worried About Running Out of Food in the Last Year: Patient declined    Ran Out of Food in the Last Year: Patient declined  Transportation Needs: No Transportation Needs (05/11/2019)   PRAPARE - Administrator, Civil Service (Medical): No    Lack of Transportation (Non-Medical): No  Physical Activity: Inactive (05/11/2019)   Exercise Vital Sign    Days of Exercise per Week: 0 days    Minutes of Exercise per Session: 0 min  Stress: No Stress Concern Present (05/11/2019)   [employer] of Occupational Health - Occupational Stress Questionnaire    Feeling of Stress : Not at all  Social Connections: Somewhat Isolated (05/11/2019)   Social Connection and Isolation Panel [NHANES]    Frequency of Communication with Friends and Family: Three times a week    Frequency of Social Gatherings with Friends and Family: Never    Attends Religious Services: Never    Database Administrator or Organizations: No    Attends Banker Meetings: Never    Marital Status: Married  Catering Manager Violence: Not At Risk (05/11/2019)   Humiliation, Afraid, Rape, and Kick questionnaire    Fear of Current or Ex-Partner: No    Emotionally Abused: No    Physically Abused: No    Sexually Abused: No    Review of Systems:    Constitutional: No weight loss, fever, chills, weakness or fatigue HEENT: Eyes: No change in vision               Ears, Nose, Throat:  No change in hearing or congestion Skin: No rash or itching Cardiovascular: No chest pain, chest pressure or palpitations   Respiratory: No SOB or cough Gastrointestinal: See HPI and otherwise  negative Genitourinary: No dysuria or change in urinary frequency Neurological: No headache, dizziness or syncope Musculoskeletal: No new muscle or joint pain Hematologic: No bleeding or bruising Psychiatric: No history of depression or anxiety  Physical Exam:  Vital signs: BP 130/60 (BP Location: Left Arm, Patient Position: Sitting, Cuff Size: Normal)   Pulse (!) 54 Comment: irregular  Ht 6' 3 (1.905 m) Comment: height measured without shoes  Wt 231 lb (104.8 kg)   BMI 28.87 kg/m   Constitutional: NAD, alert and cooperative Head:  Normocephalic and atraumatic. Eyes:   PEERL, EOMI. No icterus. Conjunctiva pink. Respiratory: Respirations even and unlabored. Lungs clear to auscultation bilaterally.   No wheezes, crackles, or rhonchi.  Cardiovascular:  Regular rate and rhythm. No peripheral edema, cyanosis or pallor.  Gastrointestinal:  Soft, nondistended, nontender. No rebound or guarding. Normal bowel sounds. No appreciable masses or hepatomegaly. Rectal: Heavenly CMA chaperone.  Decreased rectal tone.  What appears to be prolapsed internal hemorrhoids, nontender.  No fissures.  See picture and anoscopy description below Msk:  Symmetrical without gross deformities. Without edema, no deformity or joint abnormality.  Neurologic:  Alert and  oriented x4;  grossly normal neurologically.  Skin:   Dry and intact without significant lesions or rashes. Psychiatric: Oriented to person, place and time. Demonstrates good judgement and reason without abnormal affect or behaviors.  ANOSCOPY Significantly swollen left lateral internal hemorrhoid noted    Media Information  Document Information  Photos    03/30/2024 09:22  Attached To:  Office Visit on 03/30/24 with Mollie Nestor HERO, PA-C  Source Information  Mollie Nestor [place], [place]  [facility]  Document History     RELEVANT LABS AND IMAGING: CBC    Component Value Date/Time   WBC 5.1 05/11/2019 1032   RBC 5.06  05/11/2019 1032   HGB 15.5 05/11/2019 1032   HGB 15.2 04/27/2017 1016   HCT 46.8 05/11/2019 1032   HCT 44.0 04/27/2017 1016   PLT 178 05/11/2019 1032   PLT 196 04/27/2017 1016   MCV 92.5 05/11/2019 1032   MCV 92 04/27/2017 1016   MCH 30.6 05/11/2019 1032   MCHC 33.1 05/11/2019 1032   RDW 13.6 05/11/2019 1032   RDW 13.7 04/27/2017 1016   LYMPHSABS 1.6 12/01/2017 1231   LYMPHSABS 1.5 04/27/2017 1016   MONOABS 0.5 12/01/2017 1231   EOSABS 0.0 12/01/2017 1231   EOSABS 0.1 04/27/2017 1016   BASOSABS 0.0 12/01/2017 1231   BASOSABS 0.0 04/27/2017 1016    CMP     Component Value Date/Time   NA 137 05/12/2019 0408   NA 138 04/27/2017 1023   K 4.0 05/12/2019 0408   CL 107 05/12/2019 0408   CO2 23 05/12/2019 0408   GLUCOSE 79 05/12/2019 0408   BUN 13 05/12/2019 0408   BUN 15 04/27/2017 1023   CREATININE 0.97 05/12/2019 0408   CALCIUM  8.9 05/12/2019 0408   PROT 6.9 12/01/2017 1231   ALBUMIN 3.7 12/01/2017 1231   AST 31 12/01/2017 1231   ALT 22 12/01/2017 1231   ALKPHOS 67 12/01/2017 1231   BILITOT 0.9 12/01/2017 1231   GFRNONAA >60 05/12/2019 0408   GFRAA >60 05/12/2019 0408     Assessment/Plan:   Assessment & Plan  Fecal incontinence Chronic fecal incontinence with urgency and leakage, worsened since October (occurring when he stopped his probiotic).  Recent CBC/CMP with PCP was normal. Patient cannot remember if he has had a colonoscopy. (No records in system).  Suspect his fecal incontinence is multifactorial with decreased rectal tone, prolapsed internal hemorrhoids, and possible stress incontinence components since worse with sneezing/coughing.  Patient is high risk to undergo colonoscopy with his age and his heart history. - Switch to powder  fiber supplement such as Benefiber, Metamucil powder, or Citrucel powder. - Restart probiotic supplementation with Align or other probiotics. - If persistent symptoms could consider CT scan versus flexible sigmoidoscopy for further  evaluation - Follow-up 6 weeks (will call patient in 3 weeks)  Hemorrhoids Please see picture above.  Suspect prolapsed internal hemorrhoids which is likely worsening his issue with fecal incontinence.  However, if no improvement with hydrocortisone  cream it would be best to further investigate with flexible sigmoidoscopy to rule out malignancy. Discussed with Dr. Charlanne in clinic today and he agrees with plan above. - Hydrocortisone  cream 2.5% twice daily for 14 days - Follow-up in 6 weeks to repeat rectal exam and anoscopy, if no improvement consider flexible sigmoidoscopy.  Would have to hold Plavix .  CAD s/p stent 2003 on Plavix  Follows with Dr. Peter Jordan cardiology.  History of cardiac stent placement 2003 with repeat cardiac catheterization 2010 showing patency of prior stent and nonobstructive atherosclerotic disease.  Cardiac cath June 2020 with nonobstructive CAD unchanged from previous cath.  On Plavix , statin, beta-blocker.  Echocardiogram with EF 60 to 65%  Mild Alzheimer's  Assigned to Dr. Leigh today  This visit required 65 minutes of patient care (this includes precharting, chart review, review of results, face-to-face time used for counseling as well as treatment plan and follow-up. The patient was provided an opportunity to ask questions and all were answered. The patient agreed with the plan and demonstrated an understanding of the instructions.   Nestor Mollie RIGGERS [facility] 03/30/2024, 10:58 AM  Cc: Loreli Elsie JONETTA Mickey., MD "

## 2024-03-30 ENCOUNTER — Ambulatory Visit (INDEPENDENT_AMBULATORY_CARE_PROVIDER_SITE_OTHER): Admitting: Gastroenterology

## 2024-03-30 ENCOUNTER — Encounter: Payer: Self-pay | Admitting: Gastroenterology

## 2024-03-30 ENCOUNTER — Other Ambulatory Visit: Payer: Self-pay | Admitting: Cardiology

## 2024-03-30 VITALS — BP 130/60 | HR 54 | Ht 75.0 in | Wt 231.0 lb

## 2024-03-30 DIAGNOSIS — K648 Other hemorrhoids: Secondary | ICD-10-CM | POA: Diagnosis not present

## 2024-03-30 DIAGNOSIS — Z9861 Coronary angioplasty status: Secondary | ICD-10-CM

## 2024-03-30 DIAGNOSIS — I2510 Atherosclerotic heart disease of native coronary artery without angina pectoris: Secondary | ICD-10-CM

## 2024-03-30 DIAGNOSIS — R159 Full incontinence of feces: Secondary | ICD-10-CM | POA: Diagnosis not present

## 2024-03-30 DIAGNOSIS — K649 Unspecified hemorrhoids: Secondary | ICD-10-CM

## 2024-03-30 MED ORDER — HYDROCORTISONE (PERIANAL) 2.5 % EX CREA: 1.0000 | TOPICAL_CREAM | Freq: Two times a day (BID) | CUTANEOUS | 1 refills | Status: AC

## 2024-03-30 NOTE — Progress Notes (Signed)
 Agree with assessment and plan as outlined. If he is not getting better I would consider a flex sig.  May also consider referral to pelvic floor PT if you think he has decreased rectal tone, could do that in the interim as well.  Thanks

## 2024-03-30 NOTE — Patient Instructions (Addendum)
 We have sent the following medications to your pharmacy for you to pick up at your convenience: Hydrocortisone  2.5% cream twice daily  Please purchase the following medications over the counter and take as directed: Benefiber powder and Align probiotic take as directed.  A high fiber diet with plenty of fluids (up to 8 glasses of water daily) is suggested to relieve these symptoms.  Benefiber, 1 tablespoon once or twice daily can be used to keep bowels regular if needed.  Please follow up as needed if symptoms increase or worsen  Due to recent changes in healthcare laws, you may see the results of your imaging and laboratory studies on MyChart before your provider has had a chance to review them.  We understand that in some cases there may be results that are confusing or concerning to you. Not all laboratory results come back in the same time frame and the provider may be waiting for multiple results in order to interpret others.  Please give us  48 hours in order for your provider to thoroughly review all the results before contacting the office for clarification of your results.   _______________________________________________________  If your blood pressure at your visit was 140/90 or greater, please contact your primary care physician to follow up on this.  _______________________________________________________  If you are age 46 or older, your body mass index should be between 23-30. Your Body mass index is 28.87 kg/m. If this is out of the aforementioned range listed, please consider follow up with your Primary Care Provider.  If you are age 86 or younger, your body mass index should be between 19-25. Your Body mass index is 28.87 kg/m. If this is out of the aformentioned range listed, please consider follow up with your Primary Care Provider.   ________________________________________________________  The [facility] providers would like to encourage you to use MYCHART to communicate  with providers for non-urgent requests or questions.  Due to long hold times on the telephone, sending your provider a message by [facility] may be a faster and more efficient way to get a response.  Please allow 48 business hours for a response.  Please remember that this is for non-urgent requests.  _______________________________________________________ Thank you for trusting me with your gastrointestinal care!   Suzanna Erp, PA

## 2024-04-01 ENCOUNTER — Encounter: Payer: Self-pay | Admitting: Physician Assistant

## 2024-04-11 ENCOUNTER — Telehealth: Payer: Self-pay | Admitting: Gastroenterology

## 2024-04-11 NOTE — Telephone Encounter (Signed)
 Patient is calling to make Jared Clark aware that after his visit on 03/30/24, he started taking Benefiber 1 tablespoon daily and restarted his Align once every morning. Says that his symptoms of "clear liquid" with bowel movements has almost completely resolved. He has had some small, formed bowel movements, and other movements that are larger and soft/mushy. Patient does indicate that he had an episode of fecal incontinence earlier this morning that was significant and concerns him. States this is the first episode of incontinence he has had since seeing Jared Clark. He denies any rectal bleeding or rectal pain. Has had some lower abdominal/pelvic discomfort before bowel movements. He would like some additional advise on what can be done about his incontinence symptoms.

## 2024-04-11 NOTE — Telephone Encounter (Signed)
 Inbound call from patient, would like to discuss with Bayley about the powder and also probiotics, states he had a bad spell of diarrhea today and would like to discuss further.

## 2024-04-11 NOTE — Telephone Encounter (Signed)
 Discussed recommendations as per [facility], PA-C. Patient is advised to increase benefiber slowly over a week or so to 1 tablespoon twice daily. Also advised if this is ineffective, we may need to refer him for pelvic floor therapy to strengthen muscles and prevent incontinence episodes. He verbalizes understanding.

## 2024-05-09 DIAGNOSIS — R278 Other lack of coordination: Secondary | ICD-10-CM | POA: Diagnosis not present

## 2024-05-09 DIAGNOSIS — R4189 Other symptoms and signs involving cognitive functions and awareness: Secondary | ICD-10-CM | POA: Diagnosis not present

## 2024-05-09 DIAGNOSIS — R2681 Unsteadiness on feet: Secondary | ICD-10-CM | POA: Diagnosis not present

## 2024-05-09 DIAGNOSIS — R296 Repeated falls: Secondary | ICD-10-CM | POA: Diagnosis not present

## 2024-05-09 DIAGNOSIS — M6281 Muscle weakness (generalized): Secondary | ICD-10-CM | POA: Diagnosis not present

## 2024-05-09 DIAGNOSIS — R41841 Cognitive communication deficit: Secondary | ICD-10-CM | POA: Diagnosis not present

## 2024-05-10 NOTE — Progress Notes (Unsigned)
 Chief Complaint: Follow up Primary GI MD: Dr. General Kenner  HPI: 88 year old male history of BPH s/p TURP, CAD (on Plavix ), hypertension, hyperlipidemia, GERD, obesity, presents for follow up.  Follows with Dr. Peter Swaziland cardiology.  History of cardiac stent placement 2003 with repeat cardiac catheterization 2010 showing patency of prior stent and nonobstructive atherosclerotic disease.  Cardiac cath June 2020 with nonobstructive CAD unchanged from previous cath.  On Plavix , statin, beta-blocker.  Echocardiogram with EF 60 to 65%   Recently diagnosed with mild Alzheimer's dementia September 2024.     Discussed the use of AI scribe software for clinical note transcription with the patient, who gave verbal consent to proceed.  History of Present Illness He has ongoing fecal incontinence, particularly problematic when sneezing or coughing. He was previously using a fiber supplement and a probiotic, and was given a cream for hemorrhoids, which he found awkward to apply and subsequently stopped using.  Since starting the fiber supplement, his stool consistency has improved, and he has not experienced large volumes of fluid in the toilet for the past few months. However, he describes a recent episode of explosive diarrhea following the consumption of sweets, which he attributes to the dairy content in the cake and ice cream.  His stool is generally formed but occasionally experiences pressure and the need to strain slightly during bowel movements. He mentions a recent incident where he felt significant pressure and had an episode of incontinence before reaching the toilet.  He wears a wedge of toilet paper for protection and has brought a sample for examination. He describes a firm, prolapsing hemorrhoid that he noticed while showering after an episode of incontinence.  He is currently taking Benefiber once daily and a probiotic capsule. He is also starting physical therapy for mobility issues  following a fall at home, which has affected his ability to get up from the floor.  Past Medical History:  Diagnosis Date   Abnormal nuclear stress test 02/07/2014   Benign hypertensive heart disease without heart failure 08/22/2011   Benign prostate hyperplasia 03/19/2011   S/P TURP 1995   Bilateral carotid artery disease 08/29/2015   a. Carotid duplex in 05/2013: 40-59% RICA, <40% LICA. b. Per Patient, doppler 05/2015 showed 1-39% on the R - being folloewd by primary care.   Borderline diabetes    diet controlled   CAD S/P percutaneous coronary angioplasty 10/02/2015   Cataract of right eye    Chest pain with moderate risk for cardiac etiology 05/22/2016   Closed fracture of distal end of left fibula 10/21/2018   Coronary artery disease    a. 2003 s/p PCI/stenting; b. 02/2014 Abnl nuc -> Cath/PCI: LM nl, LAD nl, D2 95p (2.5x12 Promus DES), LCX <20, OM1 patent stent, RCA 50d, EF 55-65%. c. 08/2015: normal nuc.   Depression    Dyslipidemia    Essential hypertension 10/02/2015   GERD (gastroesophageal reflux disease)    Herpes 11/22/2012   Hives 12/01/2017   Ischemic heart disease 03/19/2011   Treadmill Myoview  on 08/18/12 showed a possible small scar in the basal inferolateral wall but no reversible ischemia and his ejection fraction was 57%     Major depressive disorder    Malaise and fatigue 03/19/2011   Mild dementia, concerns for Alzheimer's disease 08/21/2023   Osteoarthritis    right ankle, bilateral wrist, back   Sinus bradycardia    Syncope 12/01/2017    Past Surgical History:  Procedure Laterality Date   BACK SURGERY  CARDIAC CATHETERIZATION  02-06-09   NO INTERVENTION   CARDIAC CATHETERIZATION N/A 05/22/2016   Procedure: Left Heart Cath and Coronary Angiography;  Surgeon: Peter M Swaziland, MD;  Location: [facility] INVASIVE CV LAB;  Service: Cardiovascular;  Laterality: N/A;   CAROTID STENT     CERVICAL DISCECTOMY  1989   C4 - 5   CHOLECYSTECTOMY  2002   W/ UMBILICAL HERNIA  REPAIR   CORONARY ANGIOPLASTY  2003- FIRST OBTUSE MARGIANL VESSEL    X1 CYPHER STENT   CORONARY STENT PLACEMENT     DES to first diagonal        DR Swaziland   EYE SURGERY     HERNIA REPAIR  2002   Umbilical   INGUINAL HERNIA REPAIR  1985   KNEE ARTHROSCOPY  11/05/2011   Procedure: ARTHROSCOPY KNEE;  Surgeon: Aurther Blue;  Location: [facility];  Service: Orthopedics;  Laterality: Right;  RIGHT ARTHROSCOPY KNEE WITH DEBRIDEMENT, Chondroplasty   LEFT HEART CATH AND CORONARY ANGIOGRAPHY N/A 05/01/2017   Procedure: Left Heart Cath and Coronary Angiography;  Surgeon: Swaziland, Peter M, MD;  Location: [facility] INVASIVE CV LAB;  Service: Cardiovascular;  Laterality: N/A;   LEFT HEART CATH AND CORONARY ANGIOGRAPHY N/A 05/11/2019   Procedure: LEFT HEART CATH AND CORONARY ANGIOGRAPHY;  Surgeon: Swaziland, Peter M, MD;  Location: [facility] INVASIVE CV LAB;  Service: Cardiovascular;  Laterality: N/A;   LEFT HEART CATHETERIZATION WITH CORONARY ANGIOGRAM N/A 02/10/2014   Procedure: LEFT HEART CATHETERIZATION WITH CORONARY ANGIOGRAM;  Surgeon: Peter M Swaziland, MD;  Location: [facility] CATH LAB;  Service: Cardiovascular;  Laterality: N/A;   LUMBAR LAMINECTOMY  1973   L4-S1   PILONIDAL CYST EXCISION  1961   SHOULDER ARTHROSCOPY     Bilateral   TONSILLECTOMY AND ADENOIDECTOMY  CHILD   TRANSURETHRAL RESECTION OF PROSTATE  1995    Current Outpatient Medications  Medication Sig Dispense Refill   atorvastatin  (LIPITOR ) 40 MG tablet TAKE 1 TABLET BY MOUTH DAILY 90 tablet 3   clopidogrel  (PLAVIX ) 75 MG tablet Take 75 mg by mouth daily.     hydrocortisone  (ANUSOL -HC) 2.5 % rectal cream Place 1 Application rectally 2 (two) times daily. 21 g 1   memantine  (NAMENDA ) 5 MG tablet Take 1 tablet (5 mg at night) for 2 weeks, then increase to 1 tablet (5 mg) twice a day 60 tablet 11   metoprolol  succinate (TOPROL -XL) 25 MG 24 hr tablet Take 12.5 mg by mouth daily.      nitroGLYCERIN  (NITROSTAT ) 0.4 MG SL tablet Place 0.4 mg under  the tongue every 5 (five) minutes as needed for chest pain (x 3 tablets daily).     sertraline (ZOLOFT) 50 MG tablet Take 50 mg by mouth daily.     valsartan (DIOVAN) 160 MG tablet Take 160 mg by mouth daily.     vitamin B-12 (CYANOCOBALAMIN ) 500 MCG tablet Take 500 mcg by mouth daily.     No current facility-administered medications for this visit.    Allergies as of 05/11/2024 - Review Complete 05/11/2024  Allergen Reaction Noted   Flexeril [cyclobenzaprine] Swelling 03/09/2014   Methocarbamol Swelling    Orphenadrine citrate Swelling    Lisinopril Cough 12/01/2017   Ramipril  Other (See Comments) 05/17/2014    Family History  Problem Relation Age of Onset   Hypertension Mother    Lung cancer Father    Memory loss Maternal Grandmother    Heart attack Neg Hx    Stroke Neg Hx     Social  History   Socioeconomic History   Marital status: Married    Spouse name: Kore Madlock   Number of children: 1   Years of education: 20   Highest education level: Professional school degree (e.g., MD, DDS, DVM, JD)  Occupational History   Occupation: Retired    Comment: OBGYN physician  Tobacco Use   Smoking status: Never   Smokeless tobacco: Never  Vaping Use   Vaping status: Never Used  Substance and Sexual Activity   Alcohol  use: Yes    Comment: Rarely   Drug use: No   Sexual activity: Not Currently    Partners: Female    Comment: MARRIED  Other Topics Concern   Not on file  Social History Narrative   Right handed   Drinks caffeine   Lives with wife at [place]   Retired physician with [employer]      Social Drivers of Health   Financial Resource Strain: Low Risk  (05/11/2019)   Overall Financial Resource Strain (CARDIA)    Difficulty of Paying Living Expenses: Not very hard  Food Insecurity: Unknown (05/11/2019)   Hunger Vital Sign    Worried About Running Out of Food in the Last Year: Patient declined    Ran Out of Food in the Last Year: Patient declined   Transportation Needs: No Transportation Needs (05/11/2019)   PRAPARE - Administrator, Civil Service (Medical): No    Lack of Transportation (Non-Medical): No  Physical Activity: Inactive (05/11/2019)   Exercise Vital Sign    Days of Exercise per Week: 0 days    Minutes of Exercise per Session: 0 min  Stress: No Stress Concern Present (05/11/2019)   [employer] of Occupational Health - Occupational Stress Questionnaire    Feeling of Stress : Not at all  Social Connections: Somewhat Isolated (05/11/2019)   Social Connection and Isolation Panel [NHANES]    Frequency of Communication with Friends and Family: Three times a week    Frequency of Social Gatherings with Friends and Family: Never    Attends Religious Services: Never    Database administrator or Organizations: No    Attends Banker Meetings: Never    Marital Status: Married  Catering manager Violence: Not At Risk (05/11/2019)   Humiliation, Afraid, Rape, and Kick questionnaire    Fear of Current or Ex-Partner: No    Emotionally Abused: No    Physically Abused: No    Sexually Abused: No    Review of Systems:    Constitutional: No weight loss, fever, chills, weakness or fatigue HEENT: Eyes: No change in vision               Ears, Nose, Throat:  No change in hearing or congestion Skin: No rash or itching Cardiovascular: No chest pain, chest pressure or palpitations   Respiratory: No SOB or cough Gastrointestinal: See HPI and otherwise negative Genitourinary: No dysuria or change in urinary frequency Neurological: No headache, dizziness or syncope Musculoskeletal: No new muscle or joint pain Hematologic: No bleeding or bruising Psychiatric: No history of depression or anxiety    Physical Exam:  Vital signs: BP 122/70   Pulse 74   Ht 6\' 3"  (1.905 m)   Wt 221 lb (100.2 kg)   BMI 27.62 kg/m   Constitutional: NAD, alert and cooperative Head:  Normocephalic and atraumatic. Eyes:   PEERL, EOMI.  No icterus. Conjunctiva pink. Respiratory: Respirations even and unlabored. Lungs clear to auscultation bilaterally.   No wheezes,  crackles, or rhonchi.  Cardiovascular:  Regular rate and rhythm. No peripheral edema, cyanosis or pallor.  Gastrointestinal:  Soft, nondistended, nontender. No rebound or guarding. Normal bowel sounds. No appreciable masses or hepatomegaly. Rectal:  Trevor Fudge CMA chaperone.  No external hemorrhoids or skin tags.  Decreased rectal tone.  No prolapsed internal hemorrhoids this time (much improved from last time).  Mild internal hemorrhoids on digital exam Msk:  Symmetrical without gross deformities. Without edema, no deformity or joint abnormality.  Neurologic:  Alert and  oriented x4;  grossly normal neurologically.  Skin:   Dry and intact without significant lesions or rashes. Psychiatric: Oriented to person, place and time. Demonstrates good judgement and reason without abnormal affect or behaviors.   RELEVANT LABS AND IMAGING: CBC    Component Value Date/Time   WBC 5.1 05/11/2019 1032   RBC 5.06 05/11/2019 1032   HGB 15.5 05/11/2019 1032   HGB 15.2 04/27/2017 1016   HCT 46.8 05/11/2019 1032   HCT 44.0 04/27/2017 1016   PLT 178 05/11/2019 1032   PLT 196 04/27/2017 1016   MCV 92.5 05/11/2019 1032   MCV 92 04/27/2017 1016   MCH 30.6 05/11/2019 1032   MCHC 33.1 05/11/2019 1032   RDW 13.6 05/11/2019 1032   RDW 13.7 04/27/2017 1016   LYMPHSABS 1.6 12/01/2017 1231   LYMPHSABS 1.5 04/27/2017 1016   MONOABS 0.5 12/01/2017 1231   EOSABS 0.0 12/01/2017 1231   EOSABS 0.1 04/27/2017 1016   BASOSABS 0.0 12/01/2017 1231   BASOSABS 0.0 04/27/2017 1016    CMP     Component Value Date/Time   NA 137 05/12/2019 0408   NA 138 04/27/2017 1023   K 4.0 05/12/2019 0408   CL 107 05/12/2019 0408   CO2 23 05/12/2019 0408   GLUCOSE 79 05/12/2019 0408   BUN 13 05/12/2019 0408   BUN 15 04/27/2017 1023   CREATININE 0.97 05/12/2019 0408   CALCIUM  8.9 05/12/2019 0408    PROT 6.9 12/01/2017 1231   ALBUMIN 3.7 12/01/2017 1231   AST 31 12/01/2017 1231   ALT 22 12/01/2017 1231   ALKPHOS 67 12/01/2017 1231   BILITOT 0.9 12/01/2017 1231   GFRNONAA >60 05/12/2019 0408   GFRAA >60 05/12/2019 0408     Assessment/Plan:   Fecal incontinence Chronic fecal incontinence with urgency and leakage, worsened since October (occurring when he stopped his probiotic).  Recent CBC/CMP with PCP was normal. Patient cannot remember if he has had a colonoscopy. (No records in system).  Suspect his fecal incontinence is multifactorial with decreased rectal tone, prolapsed internal hemorrhoids, and possible stress incontinence components since worse with sneezing/coughing.  Patient is high risk to undergo colonoscopy with his age and his heart history. Recommended fiber at last visit. Drastic improvement in symptoms with resolution and now solid formed stools with rare incontinence (once that occurred with cake/ice cream) -- Continue fiber daily - Advised to pursue pelvic floor physical therapy for occasional straining, patient declined - Follow-up 12 weeks - With improvement in symptoms will hold off on endoscopic evaluation at this time secondary to age and comorbidities - Patient requesting abdominal imaging to see how much stool is in his system, will get KUB  Hemorrhoids At last visit patient had prolapsed internal hemorrhoids.  He used cream for a few days before stopping since it was awkward to apply.  Rectal exam today shows improved hemorrhoids. - Continue fiber - If recurrence of hemorrhoids please let us  know   CAD s/p stent 2003 on Plavix   Follows with Dr. Peter Swaziland cardiology.  History of cardiac stent placement 2003 with repeat cardiac catheterization 2010 showing patency of prior stent and nonobstructive atherosclerotic disease.  Cardiac cath June 2020 with nonobstructive CAD unchanged from previous cath.  On Plavix , statin, beta-blocker.  Echocardiogram with EF 60  to 65%   Mild Alzheimer's   This visit required 39 minutes of patient care (this includes precharting, chart review, review of results, face-to-face time used for counseling as well as treatment plan and follow-up. The patient was provided an opportunity to ask questions and all were answered. The patient agreed with the plan and demonstrated an understanding of the instructions.   Gigi Kyle [facility] 05/11/2024, 10:49 AM  Cc: Jeannine Milroy., MD

## 2024-05-11 ENCOUNTER — Ambulatory Visit (INDEPENDENT_AMBULATORY_CARE_PROVIDER_SITE_OTHER): Admitting: Gastroenterology

## 2024-05-11 ENCOUNTER — Encounter: Payer: Self-pay | Admitting: Gastroenterology

## 2024-05-11 ENCOUNTER — Ambulatory Visit
Admission: RE | Admit: 2024-05-11 | Discharge: 2024-05-11 | Disposition: A | Discharge status: Home / Self Care | Source: Ambulatory Visit | Attending: Gastroenterology

## 2024-05-11 VITALS — BP 122/70 | HR 74 | Ht 75.0 in | Wt 221.0 lb

## 2024-05-11 DIAGNOSIS — R32 Unspecified urinary incontinence: Secondary | ICD-10-CM | POA: Diagnosis not present

## 2024-05-11 DIAGNOSIS — Z9861 Coronary angioplasty status: Secondary | ICD-10-CM

## 2024-05-11 DIAGNOSIS — K648 Other hemorrhoids: Secondary | ICD-10-CM | POA: Diagnosis not present

## 2024-05-11 DIAGNOSIS — R2681 Unsteadiness on feet: Secondary | ICD-10-CM | POA: Diagnosis not present

## 2024-05-11 DIAGNOSIS — R194 Change in bowel habit: Secondary | ICD-10-CM | POA: Diagnosis not present

## 2024-05-11 DIAGNOSIS — Z7902 Long term (current) use of antithrombotics/antiplatelets: Secondary | ICD-10-CM | POA: Diagnosis not present

## 2024-05-11 DIAGNOSIS — R4189 Other symptoms and signs involving cognitive functions and awareness: Secondary | ICD-10-CM | POA: Diagnosis not present

## 2024-05-11 DIAGNOSIS — G309 Alzheimer's disease, unspecified: Secondary | ICD-10-CM | POA: Diagnosis not present

## 2024-05-11 DIAGNOSIS — I2510 Atherosclerotic heart disease of native coronary artery without angina pectoris: Secondary | ICD-10-CM

## 2024-05-11 DIAGNOSIS — R159 Full incontinence of feces: Secondary | ICD-10-CM | POA: Diagnosis not present

## 2024-05-11 DIAGNOSIS — M6281 Muscle weakness (generalized): Secondary | ICD-10-CM | POA: Diagnosis not present

## 2024-05-11 DIAGNOSIS — F02A0 Dementia in other diseases classified elsewhere, mild, without behavioral disturbance, psychotic disturbance, mood disturbance, and anxiety: Secondary | ICD-10-CM | POA: Diagnosis not present

## 2024-05-11 DIAGNOSIS — R278 Other lack of coordination: Secondary | ICD-10-CM | POA: Diagnosis not present

## 2024-05-11 DIAGNOSIS — R41841 Cognitive communication deficit: Secondary | ICD-10-CM | POA: Diagnosis not present

## 2024-05-11 DIAGNOSIS — K649 Unspecified hemorrhoids: Secondary | ICD-10-CM

## 2024-05-11 DIAGNOSIS — R296 Repeated falls: Secondary | ICD-10-CM | POA: Diagnosis not present

## 2024-05-11 NOTE — Progress Notes (Signed)
 Agree with assessment and plan as outlined. Ultimately pelvic floor PT may be the best option to help his symptoms, agree with that recommendation, sounds like he declined it. If symptoms persist I would encourage him to pursue that. Would hope to avoid endoscopic evaluation if possible given age and comorbidities

## 2024-05-11 NOTE — Patient Instructions (Signed)
 Your provider has requested that you have an abdominal x ray before leaving today. Please go to the basement floor to our Radiology department for the test.   _______________________________________________________  If your blood pressure at your visit was 140/90 or greater, please contact your primary care physician to follow up on this.  _______________________________________________________  If you are age 86 or older, your body mass index should be between 23-30. Your Body mass index is 27.62 kg/m. If this is out of the aforementioned range listed, please consider follow up with your Primary Care Provider.  If you are age 57 or younger, your body mass index should be between 19-25. Your Body mass index is 27.62 kg/m. If this is out of the aformentioned range listed, please consider follow up with your Primary Care Provider.   ________________________________________________________  The [facility] providers would like to encourage you to use MYCHART to communicate with providers for non-urgent requests or questions.  Due to long hold times on the telephone, sending your provider a message by [facility] may be a faster and more efficient way to get a response.  Please allow 48 business hours for a response.  Please remember that this is for non-urgent requests.  _______________________________________________________

## 2024-05-13 DIAGNOSIS — R296 Repeated falls: Secondary | ICD-10-CM | POA: Diagnosis not present

## 2024-05-13 DIAGNOSIS — R278 Other lack of coordination: Secondary | ICD-10-CM | POA: Diagnosis not present

## 2024-05-13 DIAGNOSIS — R2681 Unsteadiness on feet: Secondary | ICD-10-CM | POA: Diagnosis not present

## 2024-05-13 DIAGNOSIS — M6281 Muscle weakness (generalized): Secondary | ICD-10-CM | POA: Diagnosis not present

## 2024-05-13 DIAGNOSIS — R4189 Other symptoms and signs involving cognitive functions and awareness: Secondary | ICD-10-CM | POA: Diagnosis not present

## 2024-05-13 DIAGNOSIS — R41841 Cognitive communication deficit: Secondary | ICD-10-CM | POA: Diagnosis not present

## 2024-05-16 ENCOUNTER — Ambulatory Visit (INDEPENDENT_AMBULATORY_CARE_PROVIDER_SITE_OTHER): Admitting: Physician Assistant

## 2024-05-16 ENCOUNTER — Encounter: Payer: Self-pay | Admitting: Physician Assistant

## 2024-05-16 VITALS — BP 107/73 | HR 67 | Resp 20 | Ht 75.0 in | Wt 221.0 lb

## 2024-05-16 DIAGNOSIS — G301 Alzheimer's disease with late onset: Secondary | ICD-10-CM

## 2024-05-16 DIAGNOSIS — F02A0 Dementia in other diseases classified elsewhere, mild, without behavioral disturbance, psychotic disturbance, mood disturbance, and anxiety: Secondary | ICD-10-CM

## 2024-05-16 MED ORDER — MEMANTINE HCL 10 MG PO TABS: ORAL_TABLET | ORAL | 3 refills | Status: AC

## 2024-05-16 NOTE — Progress Notes (Addendum)
 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 Patient is currently on memantine  5 mg twice daily tolerating well.  We discussed increasing it to 10 g bid for better coverage, he agrees. MMSE is 27/30 without significant changes on his performance. He is able to participate in his ADLs and continues to drive.      Follow up in  6 months. He is scheduled for repeat neuropsych evaluation in the near future for disease trajectory Continue memantine  10 mg twice daily, side effects discussed Continue B12 supplements Recommend good control of her cardiovascular risk factors he is on Plavix  Continue to control mood as per PCP Continue PT for strength and balance     Subjective:    This patient is accompanied in the office by his wife who supplements the history.  Previous records as well as any outside records available were reviewed prior to todays visit. Patient was last seen on 11/24/2023.  Last MoCA on September 2024 was 23/30.    Any changes in memory since last visit? "About the same"-wife disagrees.  He continues to have short-term memory difficulties, especially with recent conversations, new information and people's names. "He did not remember who he was going to see today, what your name was"-wife says. He likes to do jigsaw puzzles. repeats oneself?  Endorsed by his wife. Disoriented when walking into a room? Denies    Leaving objects?  May misplace things such as the phone but not in unusual places.  Wandering behavior?  denies   Any personality  changes since last visit?  He has moments of irritability, short temper as before. Any worsening depression?:  Denies.   Hallucinations or paranoia?  Denies.   Seizures? denies    Any sleep changes?  Sleeps well most of the night. Currently he has vivid dreams, denies REM behavior or sleepwalking   Sleep apnea?   Denies.   Any hygiene concerns? Denies.  Independent of bathing and dressing?  Endorsed  Does the patient needs help with medications?  Patient is in charge   Who is in charge of the finances?  Wife is in charge     Any changes in appetite? Trying to eat healthier.     Patient have trouble swallowing? Denies.   Does the patient cook? No Any headaches?   denies   Any vision changes? Denies.  Chronic back pain  denies   Ambulates with difficulty? Denies.  "He does not like walking much " Doing PT for strength and balance.  Recent falls or head injuries? 2 weeks ago he was getting of the recliner "and landed on my knees and my chest, could not get up, rolled over. " Nurse aid helped him. No LOC or head injury.  Unilateral weakness, numbness or tingling? denies   Any tremors?  Denies   Any anosmia?  Denies   Any incontinence of urine?  Endorsed, other than having to get up at night to urinate Any bowel dysfunction?  He has a history of fecal incontinence for at least 2 years, but worsening since  October, followed by GI, uses diapers.  He is on powder fiber, probiotics without relief.  Pelvic floor PT was recommended.  He is not interested in that.  GI wants to avoid endoscopic evaluation given his age and comorbidities  Patient lives with his wife at [facility] independent living  Does the patient drive?  Very short distances she "visualizes the route ".  She does not want to enter driving out of town. He does not remember how to unlock the car door, "he only unlocks the driver door".  His wife tries to help by driving him more often.   Neuropsych evaluation 09/24/23 Briefly, results  suggested variability but overall impairment surrounding verbal learning and memory. Additional impairments were exhibited across semantic fluency and confrontation naming, while a relative weakness was exhibited across attention/concentration (below average normative range). Regarding the etiology for his mild dementia presentation, I do feel that concerns for underlying Alzheimer's disease are reasonable. While there is some variability across memory performances as a whole, there is enough impairment overall to suggest concerns for both rapid forgetting and an evolving storage impairment. Variability rather than consistent, severe impairment is encouraging and would suggest that this disease process is in relatively early stages if truly present. Additional impairment surrounding semantic fluency and confrontation naming adds to diagnostic concerns as this would represent the hallmark pattern of disease progression in this illness. Overall, concerns for earlier stages of Alzheimer's disease remain.     Initial visit 08/18/2023 How long did patient have memory difficulties? Patient has difficulty remembering recent conversations and people names, new information. "Names is a real problem, I have to use a system" likes to do crossword puzzles and  word finding, jigsaw puzzles.  He participates in some activities at [place].  repeats oneself?  Endorsed Disoriented when walking into a room?  Denies   Leaving objects in unusual places?   Denies.   Wandering behavior? Denies.   Any personality changes ? Denies.  Sometimes I feel down because I am not physically active, and also because my voice that becomes a loud whisper as the day progresses. Any history of depression?: I think in my life I had some depressive occasions but not a diagnosis of it Hallucinations or paranoia?  Denies.   Seizures? Denies.    Any sleep changes?   "Most of the time I sleep well ". Sometimes he has vivid dreams, REM  behavior or sleepwalking   Sleep apnea? Denies.   Any hygiene concerns?  Denies.   Independent of bathing and dressing?  Endorsed  Does the patient need help with medications? Patient is in charge.   Who is in charge of the finances? Wife is in charge for the last year "she wants to be in charge"    Any changes in appetite?   Denies.  I love ice cream, trying to eat more vegetables and less potatoes, trying to lose some weight.    Patient have trouble swallowing?  denies   Does the patient cook?  No   Any headaches?  Denies.   Chronic back pain?  Denies.   Ambulates with difficulty? He does not walk "as much as I should", tries to avoid sweating    Recent falls or head injuries? 1 year ago, no loss of consciousness or injuries, but  now he avoids the stairs to go up a floor to avoid any falls Vision changes? Denies. Uses glasses. Due for checkup in October  Stroke like symptoms?  Denies.  Any tremors?  Denies.   Any anosmia?  Denies.   Any incontinence of urine? Endorsed. Any bowel dysfunction? Endorsed, intermittent diarrhea. Needs diapers  Patient lives  at [facility].  History of heavy alcohol  intake? Denies.   History of heavy tobacco use? Denies.   Family history of dementia?  ? Grandmother  Does patient drive? Yes only around town, visualizes the route.    Retired in 2011 from [employer].    MRI of the brain personally reviewed from October 2024 remarkable for unchanged generalized cerebral atrophy, minimal chronic small vessel disease for age, no acute findings  PREVIOUS MEDICATIONS:   CURRENT MEDICATIONS:  Outpatient Encounter Medications as of 05/16/2024  Medication Sig   atorvastatin  (LIPITOR ) 40 MG tablet TAKE 1 TABLET BY MOUTH DAILY   clopidogrel  (PLAVIX ) 75 MG tablet Take 75 mg by mouth daily.   hydrocortisone  (ANUSOL -HC) 2.5 % rectal cream Place 1 Application rectally 2 (two) times daily.   metoprolol  succinate (TOPROL -XL) 25 MG 24 hr tablet Take 12.5 mg by mouth daily.     nitroGLYCERIN  (NITROSTAT ) 0.4 MG SL tablet Place 0.4 mg under the tongue every 5 (five) minutes as needed for chest pain (x 3 tablets daily).   sertraline (ZOLOFT) 50 MG tablet Take 50 mg by mouth daily.   valsartan (DIOVAN) 160 MG tablet Take 160 mg by mouth daily.   vitamin B-12 (CYANOCOBALAMIN ) 500 MCG tablet Take 500 mcg by mouth daily.   [DISCONTINUED] memantine  (NAMENDA ) 5 MG tablet Take 1 tablet (5 mg at night) for 2 weeks, then increase to 1 tablet (5 mg) twice a day   memantine  (NAMENDA ) 10 MG tablet Take 1 tablet twice a day   No facility-administered encounter medications on file as of 05/16/2024.       05/16/2024   10:00 AM  MMSE - Mini Mental State Exam  Orientation to time 5  Orientation to Place 5  Registration 3  Attention/ Calculation 4  Recall 1  Language- name 2 objects 2  Language- repeat 1  Language- follow 3 step command 3  Language- read & follow direction 1  Write a sentence 1  Copy design 1  Total score 27      08/18/2023   10:00 AM  Montreal Cognitive Assessment   Visuospatial/ Executive (0/5) 3  Naming (0/3) 3  Attention: Read list of digits (0/2) 2  Attention: Read list of letters (0/1) 1  Attention: Serial 7 subtraction starting at 100 (0/3) 2  Language: Repeat phrase (0/2) 1  Language : Fluency (0/1) 1  Abstraction (0/2) 2  Delayed Recall (0/5) 2  Orientation (0/6) 6  Total 23  Adjusted Score (based on education) 23    Objective:     PHYSICAL EXAMINATION:    VITALS:   Vitals:   05/16/24 1015  BP: 107/73  Pulse: 67  Resp: 20  SpO2: 95%  Weight: 221 lb (100.2 kg)  Height: 6\' 3"  (1.905 m)    GEN:  The patient appears stated age and is in NAD. HEENT:  Normocephalic, atraumatic.   Neurological examination:  General: NAD, well-groomed, appears stated age. Orientation: The patient is alert. Oriented to person, place and date Cranial nerves: There is good facial symmetry.The speech is fluent and clear. No aphasia or dysarthria.  Fund of knowledge is appropriate. Recent and remote memory are impaired. Attention and concentration are reduced. Able to name objects and repeat phrases.  Hearing is intact to conversational tone.   Sensation: Sensation is intact to light touch  throughout Motor: Strength is at least antigravity x4. DTR's 2/4 in UE/LE     Movement examination: Tone: There is normal tone in the UE/LE Abnormal movements:  no tremor.  No myoclonus.  No asterixis.   Coordination:  There is no decremation with RAM's. Normal finger to nose  Gait and Station: The patient has difficulty arising out of a deep-seated chair without the use of the hands. The patient's stride length is good.  Gait is cautious and narrow.    Thank you for allowing us  the opportunity to participate in the care of this nice patient. Please do not hesitate to contact us  for any questions or concerns.   Total time spent on today's visit was 45 minutes dedicated to this patient today, preparing to see patient, examining the patient, ordering tests and/or medications and counseling the patient, documenting clinical information in the EHR or other health record, independently interpreting results and communicating results to the patient/family, discussing treatment and goals, answering patient's questions and coordinating care.  Cc:  Jeannine Milroy., MD  Tex Filbert 05/16/2024 11:16 AM

## 2024-05-16 NOTE — Patient Instructions (Signed)
 It was a pleasure to see you today at our office.   Recommendations:  iNCREASE ACTIVITY  Continue B12 supplements  Increase Memantine  10  mg tablets  1 tablet twice daily.     Follow up 6 months   For psychiatric meds, mood meds: Please have your primary care physician manage these medications.  If you have any severe symptoms of a stroke, or other severe issues such as confusion,severe chills or fever, etc call 911 or go to the ER as you may need to be evaluated further     For assessment of decision of mental capacity and competency:  Call Dr. Laverne Potter, geriatric psychiatrist at [phone]  Counseling regarding caregiver distress, including caregiver depression, anxiety and issues regarding community resources, adult day care programs, adult living facilities, or memory care questions:  please contact your  Primary Doctor's Social Worker   Whom to call: Memory  decline, memory medications: Call our office [phone]    [email or web address]   RECOMMENDATIONS FOR ALL PATIENTS WITH MEMORY PROBLEMS: 1. Continue to exercise (Recommend 30 minutes of walking everyday, or 3 hours every week) 2. Increase social interactions - continue going to [place] and enjoy social gatherings with friends and family 3. Eat healthy, avoid fried foods and eat more fruits and vegetables 4. Maintain adequate blood pressure, blood sugar, and blood cholesterol level. Reducing the risk of stroke and cardiovascular disease also helps promoting better memory. 5. Avoid stressful situations. Live a simple life and avoid aggravations. Organize your time and prepare for the next day in anticipation. 6. Sleep well, avoid any interruptions of sleep and avoid any distractions in the bedroom that may interfere with adequate sleep quality 7. Avoid sugar, avoid sweets as there is a strong link between excessive sugar intake, diabetes, and cognitive  impairment We discussed the Mediterranean diet, which has been shown to help patients reduce the risk of progressive memory disorders and reduces cardiovascular risk. This includes eating fish, eat fruits and green leafy vegetables, nuts like almonds and hazelnuts, walnuts, and also use olive oil. Avoid fast foods and fried foods as much as possible. Avoid sweets and sugar as sugar use has been linked to worsening of memory function.  There is always a concern of gradual progression of memory problems. If this is the case, then we may need to adjust level of care according to patient needs. Support, both to the patient and caregiver, should then be put into place.         DRIVING: Regarding driving, in patients with progressive memory problems, driving will be impaired. We advise to have someone else do the driving if trouble finding directions or if minor accidents are reported. Independent driving assessment is available to determine safety of driving.   If you are interested in the driving assessment, you can contact the following:  [facility] [employer] [facility] in [place] [phone]  [facility] [phone]  [facility] [phone]  [facility] [phone] or [phone]   FALL PRECAUTIONS: Be cautious when walking. Scan the area for obstacles that may increase the risk of trips and falls. When getting up in the mornings, sit up at the edge of the bed for a few minutes before getting out of bed. Consider elevating the bed at the head end to avoid drop of blood pressure when getting up. Walk always in a well-lit room (use night lights in the walls). Avoid area rugs or power cords from appliances in the middle of the walkways. Use a  walker or a cane if necessary and consider physical therapy for balance exercise. Get your eyesight checked regularly.  FINANCIAL OVERSIGHT: Supervision, especially oversight when making financial decisions or transactions  is also recommended.  HOME SAFETY: Consider the safety of the kitchen when operating appliances like stoves, microwave oven, and blender. Consider having supervision and share cooking responsibilities until no longer able to participate in those. Accidents with firearms and other hazards in the house should be identified and addressed as well.   ABILITY TO BE LEFT ALONE: If patient is unable to contact 911 operator, consider using LifeLine, or when the need is there, arrange for someone to stay with patients. Smoking is a fire hazard, consider supervision or cessation. Risk of wandering should be assessed by caregiver and if detected at any point, supervision and safe proof recommendations should be instituted.  MEDICATION SUPERVISION: Inability to self-administer medication needs to be constantly addressed. Implement a mechanism to ensure safe administration of the medications.      Mediterranean Diet A Mediterranean diet refers to food and lifestyle choices that are based on the traditions of countries located on the [place]. This way of eating has been shown to help prevent certain conditions and improve outcomes for people who have chronic diseases, like kidney disease and heart disease. What are tips for following this plan? Lifestyle  Cook and eat meals together with your family, when possible. Drink enough fluid to keep your urine clear or pale yellow. Be physically active every day. This includes: Aerobic exercise like running or swimming. Leisure activities like gardening, walking, or housework. Get 7-8 hours of sleep each night. If recommended by your health care provider, drink red wine in moderation. This means 1 glass a day for nonpregnant women and 2 glasses a day for men. A glass of wine equals 5 oz (150 mL). Reading food labels  Check the serving size of packaged foods. For foods such as rice and pasta, the serving size refers to the amount of cooked product, not  dry. Check the total fat in packaged foods. Avoid foods that have saturated fat or trans fats. Check the ingredients list for added sugars, such as corn syrup. Shopping  At the grocery store, buy most of your food from the areas near the walls of the store. This includes: Fresh fruits and vegetables (produce). Grains, beans, nuts, and seeds. Some of these may be available in unpackaged forms or large amounts (in bulk). Fresh seafood. Poultry and eggs. Low-fat dairy products. Buy whole ingredients instead of prepackaged foods. Buy fresh fruits and vegetables in-season from local farmers markets. Buy frozen fruits and vegetables in resealable bags. If you do not have access to quality fresh seafood, buy precooked frozen shrimp or canned fish, such as tuna, salmon, or sardines. Buy small amounts of raw or cooked vegetables, salads, or olives from the deli or salad bar at your store. Stock your pantry so you always have certain foods on hand, such as olive oil, canned tuna, canned tomatoes, rice, pasta, and beans. Cooking  Cook foods with extra-virgin olive oil instead of using butter or other vegetable oils. Have meat as a side dish, and have vegetables or grains as your main dish. This means having meat in small portions or adding small amounts of meat to foods like pasta or stew. Use beans or vegetables instead of meat in common dishes like chili or lasagna. Experiment with different cooking methods. Try roasting or broiling vegetables instead of steaming or sauteing them.  Add frozen vegetables to soups, stews, pasta, or rice. Add nuts or seeds for added healthy fat at each meal. You can add these to yogurt, salads, or vegetable dishes. Marinate fish or vegetables using olive oil, lemon juice, garlic, and fresh herbs. Meal planning  Plan to eat 1 vegetarian meal one day each week. Try to work up to 2 vegetarian meals, if possible. Eat seafood 2 or more times a week. Have healthy snacks  readily available, such as: Vegetable sticks with hummus. Greek yogurt. Fruit and nut trail mix. Eat balanced meals throughout the week. This includes: Fruit: 2-3 servings a day Vegetables: 4-5 servings a day Low-fat dairy: 2 servings a day Fish, poultry, or lean meat: 1 serving a day Beans and legumes: 2 or more servings a week Nuts and seeds: 1-2 servings a day Whole grains: 6-8 servings a day Extra-virgin olive oil: 3-4 servings a day Limit red meat and sweets to only a few servings a month What are my food choices? Mediterranean diet Recommended Grains: Whole-grain pasta. Brown rice. Bulgar wheat. Polenta. Couscous. Whole-wheat bread. Dwyane Glad. Vegetables: Artichokes. Beets. Broccoli. Cabbage. Carrots. Eggplant. Green beans. Chard. Kale. Spinach. Onions. Leeks. Peas. Squash. Tomatoes. Peppers. Radishes. Fruits: Apples. Apricots. Avocado. Berries. Bananas. Cherries. Dates. Figs. Grapes. Lemons. Melon. Oranges. Peaches. Plums. Pomegranate. Meats and other protein foods: Beans. Almonds. Sunflower seeds. Pine nuts. Peanuts. Cod. Salmon. Scallops. Shrimp. Tuna. Tilapia. Clams. Oysters. Eggs. Dairy: Low-fat milk. Cheese. Greek yogurt. Beverages: Water. Red wine. Herbal tea. Fats and oils: Extra virgin olive oil. Avocado oil. Grape seed oil. Sweets and desserts: [place] yogurt with honey. Baked apples. Poached pears. Trail mix. Seasoning and other foods: Basil. Cilantro. Coriander. Cumin. Mint. Parsley. Sage. Rosemary. Tarragon. Garlic. Oregano. Thyme. Pepper. Balsalmic vinegar. Tahini. Hummus. Tomato sauce. Olives. Mushrooms. Limit these Grains: Prepackaged pasta or rice dishes. Prepackaged cereal with added sugar. Vegetables: Deep fried potatoes (french fries). Fruits: Fruit canned in syrup. Meats and other protein foods: Beef. Pork. Lamb. Poultry with skin. Hot dogs. Helene Loader. Dairy: Ice cream. Sour cream. Whole milk. Beverages: Juice. Sugar-sweetened soft drinks. Beer. Liquor and  spirits. Fats and oils: Butter. Canola oil. Vegetable oil. Beef fat (tallow). Lard. Sweets and desserts: Cookies. Cakes. Pies. Candy. Seasoning and other foods: Mayonnaise. Premade sauces and marinades. The items listed may not be a complete list. Talk with your dietitian about what dietary choices are right for you. Summary The Mediterranean diet includes both food and lifestyle choices. Eat a variety of fresh fruits and vegetables, beans, nuts, seeds, and whole grains. Limit the amount of red meat and sweets that you eat. Talk with your health care provider about whether it is safe for you to drink red wine in moderation. This means 1 glass a day for nonpregnant women and 2 glasses a day for men. A glass of wine equals 5 oz (150 mL). This information is not intended to replace advice given to you by your health care provider. Make sure you discuss any questions you have with your health care provider. Document Released: 07/17/2016 Document Revised: 08/19/2016 Document Reviewed: 07/17/2016 Elsevier Interactive Patient Education  2017 [employer].     [facility] at [facility] [phone]

## 2024-05-17 ENCOUNTER — Ambulatory Visit: Payer: Self-pay | Admitting: Gastroenterology

## 2024-05-18 DIAGNOSIS — R296 Repeated falls: Secondary | ICD-10-CM | POA: Diagnosis not present

## 2024-05-18 DIAGNOSIS — R2681 Unsteadiness on feet: Secondary | ICD-10-CM | POA: Diagnosis not present

## 2024-05-18 DIAGNOSIS — R4189 Other symptoms and signs involving cognitive functions and awareness: Secondary | ICD-10-CM | POA: Diagnosis not present

## 2024-05-18 DIAGNOSIS — R41841 Cognitive communication deficit: Secondary | ICD-10-CM | POA: Diagnosis not present

## 2024-05-18 DIAGNOSIS — M6281 Muscle weakness (generalized): Secondary | ICD-10-CM | POA: Diagnosis not present

## 2024-05-18 DIAGNOSIS — R278 Other lack of coordination: Secondary | ICD-10-CM | POA: Diagnosis not present

## 2024-05-20 DIAGNOSIS — M6281 Muscle weakness (generalized): Secondary | ICD-10-CM | POA: Diagnosis not present

## 2024-05-20 DIAGNOSIS — R296 Repeated falls: Secondary | ICD-10-CM | POA: Diagnosis not present

## 2024-05-20 DIAGNOSIS — R4189 Other symptoms and signs involving cognitive functions and awareness: Secondary | ICD-10-CM | POA: Diagnosis not present

## 2024-05-20 DIAGNOSIS — R41841 Cognitive communication deficit: Secondary | ICD-10-CM | POA: Diagnosis not present

## 2024-05-20 DIAGNOSIS — R2681 Unsteadiness on feet: Secondary | ICD-10-CM | POA: Diagnosis not present

## 2024-05-20 DIAGNOSIS — R278 Other lack of coordination: Secondary | ICD-10-CM | POA: Diagnosis not present

## 2024-05-23 DIAGNOSIS — R2681 Unsteadiness on feet: Secondary | ICD-10-CM | POA: Diagnosis not present

## 2024-05-23 DIAGNOSIS — R41841 Cognitive communication deficit: Secondary | ICD-10-CM | POA: Diagnosis not present

## 2024-05-23 DIAGNOSIS — R278 Other lack of coordination: Secondary | ICD-10-CM | POA: Diagnosis not present

## 2024-05-23 DIAGNOSIS — M6281 Muscle weakness (generalized): Secondary | ICD-10-CM | POA: Diagnosis not present

## 2024-05-23 DIAGNOSIS — R4189 Other symptoms and signs involving cognitive functions and awareness: Secondary | ICD-10-CM | POA: Diagnosis not present

## 2024-05-23 DIAGNOSIS — R296 Repeated falls: Secondary | ICD-10-CM | POA: Diagnosis not present

## 2024-05-25 DIAGNOSIS — R41841 Cognitive communication deficit: Secondary | ICD-10-CM | POA: Diagnosis not present

## 2024-05-25 DIAGNOSIS — M6281 Muscle weakness (generalized): Secondary | ICD-10-CM | POA: Diagnosis not present

## 2024-05-25 DIAGNOSIS — R2681 Unsteadiness on feet: Secondary | ICD-10-CM | POA: Diagnosis not present

## 2024-05-25 DIAGNOSIS — R296 Repeated falls: Secondary | ICD-10-CM | POA: Diagnosis not present

## 2024-05-25 DIAGNOSIS — R278 Other lack of coordination: Secondary | ICD-10-CM | POA: Diagnosis not present

## 2024-05-25 DIAGNOSIS — R4189 Other symptoms and signs involving cognitive functions and awareness: Secondary | ICD-10-CM | POA: Diagnosis not present

## 2024-05-26 ENCOUNTER — Ambulatory Visit: Payer: Medicare Other | Admitting: Physician Assistant

## 2024-05-27 DIAGNOSIS — R41841 Cognitive communication deficit: Secondary | ICD-10-CM | POA: Diagnosis not present

## 2024-05-27 DIAGNOSIS — M6281 Muscle weakness (generalized): Secondary | ICD-10-CM | POA: Diagnosis not present

## 2024-05-27 DIAGNOSIS — R278 Other lack of coordination: Secondary | ICD-10-CM | POA: Diagnosis not present

## 2024-05-27 DIAGNOSIS — R4189 Other symptoms and signs involving cognitive functions and awareness: Secondary | ICD-10-CM | POA: Diagnosis not present

## 2024-05-27 DIAGNOSIS — R296 Repeated falls: Secondary | ICD-10-CM | POA: Diagnosis not present

## 2024-05-27 DIAGNOSIS — R2681 Unsteadiness on feet: Secondary | ICD-10-CM | POA: Diagnosis not present

## 2024-06-01 DIAGNOSIS — M6281 Muscle weakness (generalized): Secondary | ICD-10-CM | POA: Diagnosis not present

## 2024-06-01 DIAGNOSIS — R296 Repeated falls: Secondary | ICD-10-CM | POA: Diagnosis not present

## 2024-06-01 DIAGNOSIS — R4189 Other symptoms and signs involving cognitive functions and awareness: Secondary | ICD-10-CM | POA: Diagnosis not present

## 2024-06-01 DIAGNOSIS — R2681 Unsteadiness on feet: Secondary | ICD-10-CM | POA: Diagnosis not present

## 2024-06-01 DIAGNOSIS — R278 Other lack of coordination: Secondary | ICD-10-CM | POA: Diagnosis not present

## 2024-06-01 DIAGNOSIS — R41841 Cognitive communication deficit: Secondary | ICD-10-CM | POA: Diagnosis not present

## 2024-06-03 DIAGNOSIS — R41841 Cognitive communication deficit: Secondary | ICD-10-CM | POA: Diagnosis not present

## 2024-06-03 DIAGNOSIS — R4189 Other symptoms and signs involving cognitive functions and awareness: Secondary | ICD-10-CM | POA: Diagnosis not present

## 2024-06-03 DIAGNOSIS — R2681 Unsteadiness on feet: Secondary | ICD-10-CM | POA: Diagnosis not present

## 2024-06-03 DIAGNOSIS — R278 Other lack of coordination: Secondary | ICD-10-CM | POA: Diagnosis not present

## 2024-06-03 DIAGNOSIS — R296 Repeated falls: Secondary | ICD-10-CM | POA: Diagnosis not present

## 2024-06-03 DIAGNOSIS — M6281 Muscle weakness (generalized): Secondary | ICD-10-CM | POA: Diagnosis not present

## 2024-06-06 DIAGNOSIS — R278 Other lack of coordination: Secondary | ICD-10-CM | POA: Diagnosis not present

## 2024-06-06 DIAGNOSIS — R41841 Cognitive communication deficit: Secondary | ICD-10-CM | POA: Diagnosis not present

## 2024-06-06 DIAGNOSIS — R2681 Unsteadiness on feet: Secondary | ICD-10-CM | POA: Diagnosis not present

## 2024-06-06 DIAGNOSIS — R4189 Other symptoms and signs involving cognitive functions and awareness: Secondary | ICD-10-CM | POA: Diagnosis not present

## 2024-06-06 DIAGNOSIS — R296 Repeated falls: Secondary | ICD-10-CM | POA: Diagnosis not present

## 2024-06-06 DIAGNOSIS — M6281 Muscle weakness (generalized): Secondary | ICD-10-CM | POA: Diagnosis not present

## 2024-06-08 DIAGNOSIS — R296 Repeated falls: Secondary | ICD-10-CM | POA: Diagnosis not present

## 2024-06-08 DIAGNOSIS — M6281 Muscle weakness (generalized): Secondary | ICD-10-CM | POA: Diagnosis not present

## 2024-06-08 DIAGNOSIS — R2681 Unsteadiness on feet: Secondary | ICD-10-CM | POA: Diagnosis not present

## 2024-06-13 DIAGNOSIS — M6281 Muscle weakness (generalized): Secondary | ICD-10-CM | POA: Diagnosis not present

## 2024-06-13 DIAGNOSIS — R296 Repeated falls: Secondary | ICD-10-CM | POA: Diagnosis not present

## 2024-06-13 DIAGNOSIS — R2681 Unsteadiness on feet: Secondary | ICD-10-CM | POA: Diagnosis not present

## 2024-06-15 DIAGNOSIS — R296 Repeated falls: Secondary | ICD-10-CM | POA: Diagnosis not present

## 2024-06-15 DIAGNOSIS — M6281 Muscle weakness (generalized): Secondary | ICD-10-CM | POA: Diagnosis not present

## 2024-06-15 DIAGNOSIS — R2681 Unsteadiness on feet: Secondary | ICD-10-CM | POA: Diagnosis not present

## 2024-06-17 DIAGNOSIS — R2681 Unsteadiness on feet: Secondary | ICD-10-CM | POA: Diagnosis not present

## 2024-06-17 DIAGNOSIS — R296 Repeated falls: Secondary | ICD-10-CM | POA: Diagnosis not present

## 2024-06-17 DIAGNOSIS — M6281 Muscle weakness (generalized): Secondary | ICD-10-CM | POA: Diagnosis not present

## 2024-07-18 ENCOUNTER — Institutional Professional Consult (permissible substitution): Payer: Medicare Other | Admitting: Psychology

## 2024-07-18 ENCOUNTER — Ambulatory Visit: Payer: Self-pay

## 2024-07-20 ENCOUNTER — Ambulatory Visit: Admitting: Gastroenterology

## 2024-07-20 ENCOUNTER — Encounter: Payer: Self-pay | Admitting: Gastroenterology

## 2024-07-20 VITALS — BP 122/68 | HR 68 | Ht 75.0 in | Wt 219.5 lb

## 2024-07-20 DIAGNOSIS — Z7902 Long term (current) use of antithrombotics/antiplatelets: Secondary | ICD-10-CM

## 2024-07-20 DIAGNOSIS — R194 Change in bowel habit: Secondary | ICD-10-CM

## 2024-07-20 DIAGNOSIS — K641 Second degree hemorrhoids: Secondary | ICD-10-CM

## 2024-07-20 DIAGNOSIS — R159 Full incontinence of feces: Secondary | ICD-10-CM | POA: Diagnosis not present

## 2024-07-20 MED ORDER — BENEFIBER PO POWD: ORAL | Status: AC

## 2024-07-20 NOTE — Patient Instructions (Addendum)
 We are referring you to Pelvic Floor Physical Therapy.  They will contact you directly to schedule an appointment.  It may take a week or more before you hear from them.  Please feel free to contact us  if you have not heard from them within 2 weeks and we will follow up on the referral.   Continue Benefiber. Can increase to twice a day if needed.  Call us  if your symptoms persist.  Thank you for entrusting me with your care and for choosing [facility], Dr. Elspeth Naval    _______________________________________________________  If your blood pressure at your visit was 140/90 or greater, please contact your primary care physician to follow up on this.  _______________________________________________________  If you are age 49 or older, your body mass index should be between 23-30. Your Body mass index is 27.44 kg/m. If this is out of the aforementioned range listed, please consider follow up with your Primary Care Provider.  If you are age 57 or younger, your body mass index should be between 19-25. Your Body mass index is 27.44 kg/m. If this is out of the aformentioned range listed, please consider follow up with your Primary Care Provider.   ________________________________________________________  The [facility] providers would like to encourage you to use MYCHART to communicate with providers for non-urgent requests or questions.  Due to long hold times on the telephone, sending your provider a message by [facility] may be a faster and more efficient way to get a response.  Please allow 48 business hours for a response.  Please remember that this is for non-urgent requests.  _______________________________________________________  [facility] is using a team-based approach to care.  Your team is made up of your doctor and two to three APPS. Our APPS (Nurse Practitioners and Physician Assistants) work with your physician to ensure care continuity for you. They are  fully qualified to address your health concerns and develop a treatment plan. They communicate directly with your gastroenterologist to care for you. Seeing the Advanced Practice Practitioners on your physician's team can help you by facilitating care more promptly, often allowing for earlier appointments, access to diagnostic testing, procedures, and other specialty referrals.

## 2024-07-20 NOTE — Progress Notes (Signed)
 HPI :  88 year old male with a history of CAD on Plavix , altered bowel habits, fecal incontinence, here for reassessment of some of these issues.  This is my first time meeting him.  He is a retired Health and safety inspector and his wife is a former Insurance underwriter.  Recall he is recently seen [facility] for altered bowel habits, looser stools with fecal incontinence.  He was placed on Benefiber daily and had added align probiotics in recent months.  He feels this has definitely made a difference and his stool is more bulked and formed than it used to be.  This is much easier for him to hold onto and has reduced his fecal incontinence.  He does take tissue paper and fold it into his underwear to avoid staining his underwear as he continues to have some mild leakage but it is not as bad as previous.  He wonders if a hemorrhoid that prolapses is also contributing to this.  He will have anywhere from a bowel movement 1-2 times a day upwards of 4 times a day, stool is typically formed but towards the end of the day becomes looser.  Benefiber again has definitely helped with the form, previously was diffusely loose.  He denies any blood in his stools.  He has had a prior colonoscopy, they think about 20 to 30 years ago.  It was normal at that time.  No family history of colon cancer.  We discussed how aggressive he want to be with the evaluation of this and if he wanted to pursue an endoscopic evaluation.  He is on Plavix .  Denies any cardiopulmonary symptoms but recently had a fall and was recovering with PT from that.  He is really not interested in pursuing colonoscopy right now    At the last visit he was found to have low rectal tone on DRE, he did not wish to pursue pelvic floor PT at the time   Past Medical History:  Diagnosis Date   Abnormal nuclear stress test 02/07/2014   Benign hypertensive heart disease without heart failure 08/22/2011   Benign prostate hyperplasia 03/19/2011   S/P TURP 1995    Bilateral carotid artery disease 08/29/2015   a. Carotid duplex in 05/2013: 40-59% RICA, <40% LICA. b. Per Patient, doppler 05/2015 showed 1-39% on the R - being folloewd by primary care.   Borderline diabetes    diet controlled   CAD S/P percutaneous coronary angioplasty 10/02/2015   Cataract of right eye    Chest pain with moderate risk for cardiac etiology 05/22/2016   Closed fracture of distal end of left fibula 10/21/2018   Coronary artery disease    a. 2003 s/p PCI/stenting; b. 02/2014 Abnl nuc -> Cath/PCI: LM nl, LAD nl, D2 95p (2.5x12 Promus DES), LCX <20, OM1 patent stent, RCA 50d, EF 55-65%. c. 08/2015: normal nuc.   Depression    Dyslipidemia    Essential hypertension 10/02/2015   GERD (gastroesophageal reflux disease)    Herpes 11/22/2012   Hives 12/01/2017   Ischemic heart disease 03/19/2011   Treadmill Myoview  on 08/18/12 showed a possible small scar in the basal inferolateral wall but no reversible ischemia and his ejection fraction was 57%     Major depressive disorder    Malaise and fatigue 03/19/2011   Mild dementia, concerns for Alzheimer's disease 08/21/2023   Osteoarthritis    right ankle, bilateral wrist, back   Sinus bradycardia    Syncope 12/01/2017     Past Surgical History:  Procedure Laterality Date   BACK SURGERY     CARDIAC CATHETERIZATION  02-06-09   NO INTERVENTION   CARDIAC CATHETERIZATION N/A 05/22/2016   Procedure: Left Heart Cath and Coronary Angiography;  Surgeon: Peter M Swaziland, MD;  Location: [facility] INVASIVE CV LAB;  Service: Cardiovascular;  Laterality: N/A;   CAROTID STENT     CERVICAL DISCECTOMY  1989   C4 - 5   CHOLECYSTECTOMY  2002   W/ UMBILICAL HERNIA REPAIR   CORONARY ANGIOPLASTY  2003- FIRST OBTUSE MARGIANL VESSEL    X1 CYPHER STENT   CORONARY STENT PLACEMENT     DES to first diagonal        DR SWAZILAND   EYE SURGERY     HERNIA REPAIR  2002   Umbilical   INGUINAL HERNIA REPAIR  1985   KNEE ARTHROSCOPY  11/05/2011   Procedure:  ARTHROSCOPY KNEE;  Surgeon: Dempsey LULLA Moan;  Location: [facility];  Service: Orthopedics;  Laterality: Right;  RIGHT ARTHROSCOPY KNEE WITH DEBRIDEMENT, Chondroplasty   LEFT HEART CATH AND CORONARY ANGIOGRAPHY N/A 05/01/2017   Procedure: Left Heart Cath and Coronary Angiography;  Surgeon: Swaziland, Peter M, MD;  Location: [facility] INVASIVE CV LAB;  Service: Cardiovascular;  Laterality: N/A;   LEFT HEART CATH AND CORONARY ANGIOGRAPHY N/A 05/11/2019   Procedure: LEFT HEART CATH AND CORONARY ANGIOGRAPHY;  Surgeon: Swaziland, Peter M, MD;  Location: [facility] INVASIVE CV LAB;  Service: Cardiovascular;  Laterality: N/A;   LEFT HEART CATHETERIZATION WITH CORONARY ANGIOGRAM N/A 02/10/2014   Procedure: LEFT HEART CATHETERIZATION WITH CORONARY ANGIOGRAM;  Surgeon: Peter M Swaziland, MD;  Location: [facility] CATH LAB;  Service: Cardiovascular;  Laterality: N/A;   LUMBAR LAMINECTOMY  1973   L4-S1   PILONIDAL CYST EXCISION  1961   SHOULDER ARTHROSCOPY     Bilateral   TONSILLECTOMY AND ADENOIDECTOMY  CHILD   TRANSURETHRAL RESECTION OF PROSTATE  1995   Family History  Problem Relation Age of Onset   Hypertension Mother    Lung cancer Father    Memory loss Maternal Grandmother    Heart attack Neg Hx    Stroke Neg Hx    Social History   Tobacco Use   Smoking status: Never   Smokeless tobacco: Never  Vaping Use   Vaping status: Never Used  Substance Use Topics   Alcohol  use: Yes    Comment: Rarely   Drug use: No   Current Outpatient Medications  Medication Sig Dispense Refill   atorvastatin  (LIPITOR ) 40 MG tablet TAKE 1 TABLET BY MOUTH DAILY 90 tablet 3   clopidogrel  (PLAVIX ) 75 MG tablet Take 75 mg by mouth daily.     hydrocortisone  (ANUSOL -HC) 2.5 % rectal cream Place 1 Application rectally 2 (two) times daily. 21 g 1   memantine  (NAMENDA ) 10 MG tablet Take 1 tablet twice a day 180 tablet 3   metoprolol  succinate (TOPROL -XL) 25 MG 24 hr tablet Take 12.5 mg by mouth daily.      nitroGLYCERIN  (NITROSTAT ) 0.4  MG SL tablet Place 0.4 mg under the tongue every 5 (five) minutes as needed for chest pain (x 3 tablets daily).     sertraline (ZOLOFT) 50 MG tablet Take 50 mg by mouth daily.     valsartan (DIOVAN) 160 MG tablet Take 160 mg by mouth daily.     vitamin B-12 (CYANOCOBALAMIN ) 500 MCG tablet Take 500 mcg by mouth daily.     No current facility-administered medications for this visit.   Allergies  Allergen Reactions  Flexeril [Cyclobenzaprine] Swelling   Methocarbamol Swelling   Orphenadrine Citrate Swelling   Lisinopril Cough   Ramipril  Other (See Comments)    DRY COUGH     Review of Systems: All systems reviewed and negative except where noted in HPI.   Lab Results  Component Value Date   WBC 5.1 05/11/2019   HGB 15.5 05/11/2019   HCT 46.8 05/11/2019   MCV 92.5 05/11/2019   PLT 178 05/11/2019    Lab Results  Component Value Date   NA 137 05/12/2019   CL 107 05/12/2019   K 4.0 05/12/2019   CO2 23 05/12/2019   BUN 13 05/12/2019   CREATININE 0.97 05/12/2019   GFRNONAA >60 05/12/2019   CALCIUM  8.9 05/12/2019   ALBUMIN 3.7 12/01/2017   GLUCOSE 79 05/12/2019    Lab Results  Component Value Date   ALT 22 12/01/2017   AST 31 12/01/2017   ALKPHOS 67 12/01/2017   BILITOT 0.9 12/01/2017     Physical Exam: BP 122/68   Pulse 68   Ht 6' 3 (1.905 m)   Wt 219 lb 8 oz (99.6 kg)   SpO2 95%   BMI 27.44 kg/m  Constitutional: Pleasant,well-developed, male in no acute distress. Neurological: Alert and oriented to person place and time. Psychiatric: Normal mood and affect. Behavior is normal.   ASSESSMENT: 88 y.o. male here for assessment of the following  1. Altered bowel habits   2. Incontinence of feces, unspecified fecal incontinence type   3. Grade II hemorrhoids   4. Antiplatelet or antithrombotic long-term use    With time on Benefiber and align his bowels are much more formed and fecal incontinence has significantly improved.  He is clearly better at this  time however still has some leakage that bothers him.  He does have internal hemorrhoids, also noted to have low resting anal tone on DRE at the last visit with Con.   Discussed how aggressive he wanted to be with workup.  He clearly has not had a colonoscopy in some time but does not have any alarm symptoms or bleeding/anemia etc.  He really wants to avoid colonoscopy if at all possible, higher than average risk with the CAD, Plavix , recently had a fall.  I think it is reasonable as long as he is improving with therapy.  I do think he should continue fiber supplement with Benefiber to keep stools bulked which will help reduce leakage.  I think he could increase of the Benefiber to twice daily dosing to help maintain bulk stool throughout the day.  He inquires about increasing probiotic.  I would not necessarily recommend that, in general I do not use them for the symptoms he is having, but if he has had benefit and likes taking it I am not opposed to it, does not have much risk.  I suspect the benefit he has has been related to the Benefiber and the probiotic.  Where I think he would get the most benefit for his leakage would be from pelvic floor physical therapy to strengthen the anal sphincter.  We discussed what this is.  This really has minimal risk and if he is willing to pursue it I think it would help.  Following extensive discussion he is okay to proceed with that.  They inquire about treatment of hemorrhoids with surgery or banding.  I certainly would not recommend hemorrhoidectomy for him given his age and comorbidities.  If he fails pelvic floor PT and if thought that the hemorrhoids  are prolapsing and potentially leading to leakage we could consider hemorrhoid banding.  I counseled him he is higher than average risk for bleeding if we do this on Plavix , again we will try to avoid that if we can but we will await his course.  He agrees   PLAN: - continue benefiber, can increase to BID -  discussed probiotics, I don't necessarily recommend it but he can use it if helps - referral for pelvic floor PT for weak pelvic floor / anal sphincter on prior DRE - consideration for hemorrhoid banding if this is contributing, but higher risk on Plavix , consider if symptoms persist pending his course - we discussed colonoscopy vs. Flex sig, at length, he wants to avoid endoscopic evaluation if at all possible   Marcey Naval, MD [facility]

## 2024-07-25 ENCOUNTER — Encounter: Payer: Medicare Other | Admitting: Psychology

## 2024-08-11 DIAGNOSIS — E785 Hyperlipidemia, unspecified: Secondary | ICD-10-CM | POA: Diagnosis not present

## 2024-08-11 DIAGNOSIS — R7301 Impaired fasting glucose: Secondary | ICD-10-CM | POA: Diagnosis not present

## 2024-08-11 DIAGNOSIS — I129 Hypertensive chronic kidney disease with stage 1 through stage 4 chronic kidney disease, or unspecified chronic kidney disease: Secondary | ICD-10-CM | POA: Diagnosis not present

## 2024-08-11 DIAGNOSIS — E538 Deficiency of other specified B group vitamins: Secondary | ICD-10-CM | POA: Diagnosis not present

## 2024-08-11 DIAGNOSIS — N1831 Chronic kidney disease, stage 3a: Secondary | ICD-10-CM | POA: Diagnosis not present

## 2024-08-11 DIAGNOSIS — Z125 Encounter for screening for malignant neoplasm of prostate: Secondary | ICD-10-CM | POA: Diagnosis not present

## 2024-08-11 DIAGNOSIS — Z1389 Encounter for screening for other disorder: Secondary | ICD-10-CM | POA: Diagnosis not present

## 2024-08-18 DIAGNOSIS — N1831 Chronic kidney disease, stage 3a: Secondary | ICD-10-CM | POA: Diagnosis not present

## 2024-08-18 DIAGNOSIS — R7301 Impaired fasting glucose: Secondary | ICD-10-CM | POA: Diagnosis not present

## 2024-08-18 DIAGNOSIS — Z0000 Encounter for general adult medical examination without abnormal findings: Secondary | ICD-10-CM | POA: Diagnosis not present

## 2024-08-18 DIAGNOSIS — F3342 Major depressive disorder, recurrent, in full remission: Secondary | ICD-10-CM | POA: Diagnosis not present

## 2024-08-18 DIAGNOSIS — J309 Allergic rhinitis, unspecified: Secondary | ICD-10-CM | POA: Diagnosis not present

## 2024-08-18 DIAGNOSIS — R3915 Urgency of urination: Secondary | ICD-10-CM | POA: Diagnosis not present

## 2024-08-18 DIAGNOSIS — Z1339 Encounter for screening examination for other mental health and behavioral disorders: Secondary | ICD-10-CM | POA: Diagnosis not present

## 2024-08-18 DIAGNOSIS — I6529 Occlusion and stenosis of unspecified carotid artery: Secondary | ICD-10-CM | POA: Diagnosis not present

## 2024-08-18 DIAGNOSIS — R82998 Other abnormal findings in urine: Secondary | ICD-10-CM | POA: Diagnosis not present

## 2024-08-18 DIAGNOSIS — Z860101 Personal history of adenomatous and serrated colon polyps: Secondary | ICD-10-CM | POA: Diagnosis not present

## 2024-08-18 DIAGNOSIS — Z7189 Other specified counseling: Secondary | ICD-10-CM | POA: Diagnosis not present

## 2024-08-18 DIAGNOSIS — E785 Hyperlipidemia, unspecified: Secondary | ICD-10-CM | POA: Diagnosis not present

## 2024-08-18 DIAGNOSIS — I2510 Atherosclerotic heart disease of native coronary artery without angina pectoris: Secondary | ICD-10-CM | POA: Diagnosis not present

## 2024-08-18 DIAGNOSIS — I129 Hypertensive chronic kidney disease with stage 1 through stage 4 chronic kidney disease, or unspecified chronic kidney disease: Secondary | ICD-10-CM | POA: Diagnosis not present

## 2024-08-18 DIAGNOSIS — G309 Alzheimer's disease, unspecified: Secondary | ICD-10-CM | POA: Diagnosis not present

## 2024-08-18 DIAGNOSIS — Z1331 Encounter for screening for depression: Secondary | ICD-10-CM | POA: Diagnosis not present

## 2024-08-18 DIAGNOSIS — F02A0 Dementia in other diseases classified elsewhere, mild, without behavioral disturbance, psychotic disturbance, mood disturbance, and anxiety: Secondary | ICD-10-CM | POA: Diagnosis not present

## 2024-08-18 DIAGNOSIS — Z23 Encounter for immunization: Secondary | ICD-10-CM | POA: Diagnosis not present

## 2024-08-18 DIAGNOSIS — N401 Enlarged prostate with lower urinary tract symptoms: Secondary | ICD-10-CM | POA: Diagnosis not present

## 2024-09-14 ENCOUNTER — Ambulatory Visit

## 2024-09-28 DIAGNOSIS — Z961 Presence of intraocular lens: Secondary | ICD-10-CM | POA: Diagnosis not present

## 2024-09-28 DIAGNOSIS — H52203 Unspecified astigmatism, bilateral: Secondary | ICD-10-CM | POA: Diagnosis not present

## 2024-09-29 ENCOUNTER — Ambulatory Visit: Payer: Self-pay

## 2024-09-29 ENCOUNTER — Ambulatory Visit: Payer: Medicare Other | Admitting: Psychology

## 2024-09-29 ENCOUNTER — Encounter: Payer: Self-pay | Admitting: Psychology

## 2024-09-29 DIAGNOSIS — G301 Alzheimer's disease with late onset: Secondary | ICD-10-CM | POA: Diagnosis not present

## 2024-09-29 DIAGNOSIS — R4189 Other symptoms and signs involving cognitive functions and awareness: Secondary | ICD-10-CM

## 2024-09-29 DIAGNOSIS — F0280 Dementia in other diseases classified elsewhere without behavioral disturbance: Secondary | ICD-10-CM

## 2024-09-29 NOTE — Progress Notes (Signed)
   Psychometrician Note   Cognitive testing was administered to Jared Clark by Lonell Jude, B.S. (psychometrist) under the supervision of Dr. Arthea KYM Maryland, Ph.D., ABPP, licensed psychologist on 09/29/2024. Jared Clark did not appear overtly distressed by the testing session per behavioral observation or responses across self-report questionnaires. Rest breaks were offered.   The battery of tests administered was selected by Dr. Arthea KYM Maryland, Ph.D., ABPP with consideration to Jared Clark current level of functioning, the nature of his symptoms, emotional and behavioral responses during interview, level of literacy, observed level of motivation/effort, and the nature of the referral question. This battery was communicated to the psychometrist. Communication between Dr. Arthea KYM Maryland, Ph.D., ABPP and the psychometrist was ongoing throughout the evaluation and Dr. Arthea KYM Maryland, Ph.D., ABPP was immediately accessible at all times. Dr. Zachary C. Merz, Ph.D., ABPP provided supervision to the psychometrist on the date of this service to the extent necessary to assure the quality of all services provided.    Jared Clark will return within approximately 1-2 weeks for an interactive feedback session with Dr. Maryland at which time his test performances, clinical impressions, and treatment recommendations will be reviewed in detail. Jared Clark understands he can contact our office should he require our assistance before this time.  A total of 115 minutes of billable time were spent face-to-face with Jared Clark by the psychometrist. This includes both test administration and scoring time. Billing for these services is reflected in the clinical report generated by Dr. Arthea KYM Maryland, Ph.D., ABPP  This note reflects time spent with the psychometrician and does not include test scores or any clinical interpretations made by Dr. Maryland. The full report will follow in a separate note.

## 2024-09-29 NOTE — Progress Notes (Unsigned)
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 Score Percentile   Test of Premorbid Functioning: 111 110 75 Above Average        Memory:      NAB Memory Module, Form 1:      List Learning T Score T Score Percentile     Total Trials 1-3 9/36 (20) 11/36 (31) 3 Well Below Average    List B 2/12 (38) 2/12 (38) 12 Below Average    Short Delay Free Recall 0/12 (20) 2/12 (29) 2 Well Below Average    Long Delay Free Recall 0/12 (27) 0/12 (27) 1 Exceptionally Low    Retention Percentage 0 (30) 0 (30) 2 Well Below Average    Recognition Discriminability 0 (35) -8 (13) <1 Exceptionally Low  Story Learning        Immediate Recall 34/80 (30) 34/80 (30) 2 Well Below Average    Delayed Recall 13/40 (33) 10/40 (31) 3 Well Below Average    Retention Percentage 72 (46) 53 (39) 14 Below Average  Daily Living Memory        Immediate Recall 37/51 (43) 38/51 (45) 31 Average    Delayed Recall 8/17 (35) 7/17 (31) 3 Well Below Average    Retention Percentage 50 (34) 44 (31) 3 Well Below Average    Recognition Hits 7/10 (43) 7/10 (43) 25 Average         Attention/Executive Function:      Trail Making Test (TMT): Raw Score (T Score) Raw Score (T Score) Percentile     Part A 28 secs.,  0 errors (58) 33 secs.,  0 errors (54) 66 Average    Part B 109 secs.,  1 error (45) 94 secs.,  3 errors (50) 50 Average          Scaled Score Scaled Score Percentile   WAIS-IV Coding: 14 15 95 Well Above Average        NAB Attention Module, Form 1: T Score T Score Percentile     Digits Forward 41 38 12 Below Average  Digits Backwards 41 51 54 Average         Scaled Score Scaled Score Percentile   WAIS-IV Similarities: --- 6 9 Below Average        D-KEFS Color-Word Interference Test: Raw Score (Scaled Score) Raw Score (Scaled Score) Percentile     Color Naming --- 33 secs. (11) 63 Average    Word Reading --- 19 secs. (14) 91 Well Above Average    Inhibition --- 87 secs. (11) 63 Average      Total Errors --- 2 errors (11) 63 Average    Inhibition/Switching --- 89 secs. (11) 63 Average      Total Errors --- 10 errors (4) 2 Well Below Average        Language:      Verbal Fluency Test: Raw Score (T Score) Raw Score (T Score) Percentile     Phonemic Fluency (FAS) 35 (44) 44 (51) 54 Average    Animal Fluency 10 (26) 13 (35) 7 Well Below Average         NAB Language Module, Form 1: T Score T Score Percentile     Naming 26/31 (35) 24/31 (28) 2 Well Below Average        Visuospatial/Visuoconstruction:       Raw Score Raw Score Percentile   Clock Drawing: 8/10 9/10 --- Within Normal Limits        NAB Spatial Module, Form 1: T Score T Score Percentile     Figure Drawing Copy 67 67 96 Well Above Average         Scaled Score Scaled Score Percentile   WAIS-IV Block Design: 14 13 84 Above Average        Mood and Personality:       Raw Score Raw Score Percentile   Geriatric Depression Scale: 14 14 --- Mild  Geriatric Anxiety Scale: 23 19 --- Mild    Somatic 10 6 --- Mild    Cognitive 6 5 --- Mild    Affective 7 8 --- Moderate         Additional Questionnaires:       Raw Score Raw Score Percentile   PROMIS Sleep Disturbance Questionnaire: 21 22 --- None to Slight   Informed Consent and Coding/Compliance:   The current evaluation represents a clinical evaluation for the purposes previously outlined by the referral source and is in no way reflective of a forensic evaluation.   Jared Clark was provided with a verbal description of the nature and purpose of the present neuropsychological evaluation. Also reviewed were the foreseeable risks and/or discomforts and benefits of the procedure, limits of confidentiality, and mandatory reporting requirements of this provider. The patient was given the opportunity to ask questions and receive answers about the evaluation. Oral consent to participate was provided by the patient.   This evaluation was conducted by Arthea KYM Maryland, Ph.D., ABPP-CN, board certified clinical neuropsychologist. Jared Clark completed a clinical interview with Dr. Maryland, billed as one unit [phone], and 115 minutes of cognitive testing and scoring, billed as one unit [phone] and three additional units 96139. Psychometrist Lonell Jude, B.S. assisted Dr. Maryland with test administration and scoring procedures. As a separate and discrete service, one unit [phone] and two units 96133 (162 minutes) were billed for Dr. Loralee time spent in interpretation and report writing.

## 2024-09-30 ENCOUNTER — Encounter: Payer: Self-pay | Admitting: Psychology

## 2024-10-06 ENCOUNTER — Ambulatory Visit (INDEPENDENT_AMBULATORY_CARE_PROVIDER_SITE_OTHER): Payer: Medicare Other | Admitting: Psychology

## 2024-10-06 DIAGNOSIS — G301 Alzheimer's disease with late onset: Secondary | ICD-10-CM

## 2024-10-06 DIAGNOSIS — F0280 Dementia in other diseases classified elsewhere without behavioral disturbance: Secondary | ICD-10-CM

## 2024-10-06 NOTE — Progress Notes (Signed)
   Neuropsychology Feedback Session Jolynn DEL. [facility] [place] Department of Neurology  Reason for Referral:   Jared Clark is a 88 y.o. right-handed Caucasian male referred by Camie Sevin, PA-C, to characterize his current cognitive functioning and assist with diagnostic clarity and treatment planning in the context of a previously diagnosed mild dementia presentation, concerns for underlying Alzheimer's disease, and concerns for progressive cognitive decline.   Feedback:   Jared Clark completed a comprehensive neuropsychological evaluation on 09/29/2024. Please refer to that encounter for the full report and recommendations. Briefly, results suggested significant impairment surrounding semantic fluency, confrontation naming, and delayed retrieval aspects of verbal memory. Further variability was exhibited across executive functioning and both encoding (i.e., learning) and recognition/consolidation aspects of memory. Relative to his previous evaluation in September 2024, mild decline was exhibited across delayed retrieval and recognition/consolidation aspects of verbal memory. Current retention rates were 0% across a list learning task (0% in 2024), 53% across a story learning task (72% in 2024), and 44% across a daily living task (50% in 2024). Performances across other assessed cognitive domains was largely stable over time. Regarding the cause for his very mild dementia presentation, concerns surrounding underlying Alzheimer's disease remain. Diminished retention rates and variable recognition performances continue to raise concern for rapid forgetting and an evolving storage impairment, both of which are the hallmark memory testing patterns for this illness. Additional impairment surrounding semantic fluency and confrontation naming adds to diagnostic concerns as this would represent the expected pattern of disease progression. If truly present, this illness would appear to be  progressing at a very slow rate, likely aided by Jared Clark being a bright individual and having a good degree of cognitive reserve. Continued medical monitoring will be important moving forward.   Jared Clark was accompanied by his wife during the current feedback session. Content of the current session focused on the results of his neuropsychological evaluation. Jared Clark was given the opportunity to ask questions and his questions were answered. He was encouraged to reach out should additional questions arise. A copy of his report was provided at the conclusion of the visit.      One unit 96132 (35 minutes) was billed for Dr. Loralee time spent preparing for, conducting, and documenting the current feedback session with Jared Clark.

## 2024-10-11 ENCOUNTER — Emergency Department (HOSPITAL_COMMUNITY)

## 2024-10-11 ENCOUNTER — Encounter (HOSPITAL_COMMUNITY): Payer: Self-pay

## 2024-10-11 ENCOUNTER — Emergency Department (HOSPITAL_COMMUNITY)
Admission: EM | Admit: 2024-10-11 | Discharge: 2024-10-11 | Disposition: A | Discharge status: Home / Self Care | Attending: Emergency Medicine | Admitting: Emergency Medicine

## 2024-10-11 DIAGNOSIS — I10 Essential (primary) hypertension: Secondary | ICD-10-CM | POA: Diagnosis not present

## 2024-10-11 DIAGNOSIS — S0101XA Laceration without foreign body of scalp, initial encounter: Secondary | ICD-10-CM | POA: Insufficient documentation

## 2024-10-11 DIAGNOSIS — M25562 Pain in left knee: Secondary | ICD-10-CM | POA: Diagnosis not present

## 2024-10-11 DIAGNOSIS — Z79899 Other long term (current) drug therapy: Secondary | ICD-10-CM | POA: Diagnosis not present

## 2024-10-11 DIAGNOSIS — W010XXA Fall on same level from slipping, tripping and stumbling without subsequent striking against object, initial encounter: Secondary | ICD-10-CM | POA: Diagnosis not present

## 2024-10-11 DIAGNOSIS — S199XXA Unspecified injury of neck, initial encounter: Secondary | ICD-10-CM | POA: Diagnosis not present

## 2024-10-11 DIAGNOSIS — R2681 Unsteadiness on feet: Secondary | ICD-10-CM | POA: Diagnosis not present

## 2024-10-11 DIAGNOSIS — G319 Degenerative disease of nervous system, unspecified: Secondary | ICD-10-CM | POA: Diagnosis not present

## 2024-10-11 DIAGNOSIS — M47816 Spondylosis without myelopathy or radiculopathy, lumbar region: Secondary | ICD-10-CM | POA: Diagnosis not present

## 2024-10-11 DIAGNOSIS — F32A Depression, unspecified: Secondary | ICD-10-CM | POA: Diagnosis not present

## 2024-10-11 DIAGNOSIS — M25561 Pain in right knee: Secondary | ICD-10-CM | POA: Insufficient documentation

## 2024-10-11 DIAGNOSIS — I2510 Atherosclerotic heart disease of native coronary artery without angina pectoris: Secondary | ICD-10-CM | POA: Insufficient documentation

## 2024-10-11 DIAGNOSIS — R278 Other lack of coordination: Secondary | ICD-10-CM | POA: Diagnosis not present

## 2024-10-11 DIAGNOSIS — I119 Hypertensive heart disease without heart failure: Secondary | ICD-10-CM | POA: Diagnosis not present

## 2024-10-11 DIAGNOSIS — Z7902 Long term (current) use of antithrombotics/antiplatelets: Secondary | ICD-10-CM | POA: Diagnosis not present

## 2024-10-11 DIAGNOSIS — S0101XD Laceration without foreign body of scalp, subsequent encounter: Secondary | ICD-10-CM | POA: Diagnosis not present

## 2024-10-11 DIAGNOSIS — F0390 Unspecified dementia without behavioral disturbance: Secondary | ICD-10-CM | POA: Insufficient documentation

## 2024-10-11 DIAGNOSIS — G9389 Other specified disorders of brain: Secondary | ICD-10-CM | POA: Diagnosis not present

## 2024-10-11 DIAGNOSIS — M1710 Unilateral primary osteoarthritis, unspecified knee: Secondary | ICD-10-CM | POA: Diagnosis not present

## 2024-10-11 DIAGNOSIS — W19XXXA Unspecified fall, initial encounter: Secondary | ICD-10-CM

## 2024-10-11 DIAGNOSIS — R638 Other symptoms and signs concerning food and fluid intake: Secondary | ICD-10-CM | POA: Diagnosis not present

## 2024-10-11 DIAGNOSIS — R41841 Cognitive communication deficit: Secondary | ICD-10-CM | POA: Diagnosis not present

## 2024-10-11 DIAGNOSIS — S0990XA Unspecified injury of head, initial encounter: Secondary | ICD-10-CM | POA: Diagnosis present

## 2024-10-11 DIAGNOSIS — R296 Repeated falls: Secondary | ICD-10-CM | POA: Diagnosis not present

## 2024-10-11 DIAGNOSIS — M1611 Unilateral primary osteoarthritis, right hip: Secondary | ICD-10-CM | POA: Diagnosis not present

## 2024-10-11 DIAGNOSIS — Z043 Encounter for examination and observation following other accident: Secondary | ICD-10-CM | POA: Diagnosis not present

## 2024-10-11 DIAGNOSIS — M6281 Muscle weakness (generalized): Secondary | ICD-10-CM | POA: Diagnosis not present

## 2024-10-11 LAB — CBC
HCT: 42.5 % (ref 39.0–52.0)
Hemoglobin: 14.1 g/dL (ref 13.0–17.0)
MCH: 31.3 pg (ref 26.0–34.0)
MCHC: 33.2 g/dL (ref 30.0–36.0)
MCV: 94.4 fL (ref 80.0–100.0)
Platelets: 145 K/uL — ABNORMAL LOW (ref 150–400)
RBC: 4.5 MIL/uL (ref 4.22–5.81)
RDW: 13.8 % (ref 11.5–15.5)
WBC: 5 K/uL (ref 4.0–10.5)
nRBC: 0 % (ref 0.0–0.2)

## 2024-10-11 LAB — BASIC METABOLIC PANEL WITH GFR
Anion gap: 8 (ref 5–15)
BUN: 16 mg/dL (ref 8–23)
CO2: 24 mmol/L (ref 22–32)
Calcium: 9.4 mg/dL (ref 8.9–10.3)
Chloride: 105 mmol/L (ref 98–111)
Creatinine, Ser: 1.29 mg/dL — ABNORMAL HIGH (ref 0.61–1.24)
GFR, Estimated: 54 mL/min — ABNORMAL LOW (ref >60)
Glucose, Bld: 103 mg/dL — ABNORMAL HIGH (ref 70–99)
Potassium: 4.2 mmol/L (ref 3.5–5.1)
Sodium: 137 mmol/L (ref 135–145)

## 2024-10-11 NOTE — ED Notes (Signed)
 Extra Blue tube drawn

## 2024-10-11 NOTE — ED Triage Notes (Signed)
 Per EMS, Pt, from home, presents after multiple falls.  Pt fell and hit front of head yesterday and hit back of head today.  Denies head pain.  C/o chronic bilateral knee pain.  Pain score 1/10.    Pt takes Plavix .  No bleeding noted.   C collar in place.

## 2024-10-11 NOTE — Progress Notes (Signed)
 Orthopedic Tech Progress Note Patient Details:  Jared Clark 30-Jul-1936 [id number]  Level 2 trauma   Patient ID: Jared Clark, male   DOB: 1936-03-07, 88 y.o.   MRN: [id number]  Jared Clark 10/11/2024, 3:32 PM

## 2024-10-11 NOTE — ED Provider Notes (Signed)
 [place] EMERGENCY DEPARTMENT AT [facility] Provider Note   CSN: [id number] Arrival date & time: 10/11/24  1213     Patient presents with: Level 2- FOT   Jared Clark is a 88 y.o. male.   HPI Patient presents after fall.  Reportedly fell last night and then fell again today.  Tripped and hit the back of his head.  Reported laceration on his occipital area.  Has bilateral knee pain.  Is on Plavix  however.  No chest or abdominal pain.   Past Medical History:  Diagnosis Date   Abnormal nuclear stress test 02/07/2014   Benign hypertensive heart disease without heart failure 08/22/2011   Benign prostate hyperplasia 03/19/2011   S/P TURP 1995   Bilateral carotid artery disease 08/29/2015   a. Carotid duplex in 05/2013: 40-59% RICA, <40% LICA. b. Per Patient, doppler 05/2015 showed 1-39% on the R - being folloewd by primary care.   Borderline diabetes    diet controlled   CAD S/P percutaneous coronary angioplasty 10/02/2015   Cataract of right eye    Chest pain with moderate risk for cardiac etiology 05/22/2016   Closed fracture of distal end of left fibula 10/21/2018   Coronary artery disease    a. 2003 s/p PCI/stenting; b. 02/2014 Abnl nuc -> Cath/PCI: LM nl, LAD nl, D2 95p (2.5x12 Promus DES), LCX <20, OM1 patent stent, RCA 50d, EF 55-65%. c. 08/2015: normal nuc.   Depression    Dyslipidemia    Essential hypertension 10/02/2015   GERD (gastroesophageal reflux disease)    Herpes 11/22/2012   Hives 12/01/2017   Ischemic heart disease 03/19/2011   Treadmill Myoview  on 08/18/12 showed a possible small scar in the basal inferolateral wall but no reversible ischemia and his ejection fraction was 57%     Major depressive disorder    Malaise and fatigue 03/19/2011   Mild dementia, concerns for Alzheimer's disease 08/21/2023   Osteoarthritis    right ankle, bilateral wrist, back   Sinus bradycardia    Syncope 12/01/2017    Prior to Admission medications   Medication  Sig Start Date End Date Taking? Authorizing Provider  atorvastatin  (LIPITOR ) 40 MG tablet TAKE 1 TABLET BY MOUTH DAILY 03/31/24   Jordan, Peter M, MD  clopidogrel  (PLAVIX ) 75 MG tablet Take 75 mg by mouth daily.    [provider]  hydrocortisone  (ANUSOL -HC) 2.5 % rectal cream Place 1 Application rectally 2 (two) times daily. 03/30/24   McMichael, Nestor HERO, PA-C  memantine  (NAMENDA ) 10 MG tablet Take 1 tablet twice a day 05/16/24   Wertman, Sara E, PA-C  metoprolol  succinate (TOPROL -XL) 25 MG 24 hr tablet Take 12.5 mg by mouth daily.  09/09/12   Dominick Ned, MD  nitroGLYCERIN  (NITROSTAT ) 0.4 MG SL tablet Place 0.4 mg under the tongue every 5 (five) minutes as needed for chest pain (x 3 tablets daily).    [provider]  sertraline (ZOLOFT) 50 MG tablet Take 50 mg by mouth daily. 01/26/24   [provider]  valsartan (DIOVAN) 160 MG tablet Take 160 mg by mouth daily. 12/08/21   [provider]  vitamin B-12 (CYANOCOBALAMIN ) 500 MCG tablet Take 500 mcg by mouth daily.    [provider]  Wheat Dextrin (BENEFIBER) POWD Use as directed, daily to twice a day 07/20/24   Armbruster, Elspeth SQUIBB, MD    Allergies: Flexeril [cyclobenzaprine], Methocarbamol, Orphenadrine citrate, Lisinopril, and Ramipril     Review of Systems  Updated Vital Signs BP (!) 120/58  Pulse 64   Temp 98 F (36.7 C) (Oral)   Resp 15   Ht 6' 3 (1.905 m)   Wt 99.3 kg   SpO2 100%   BMI 27.37 kg/m   Physical Exam Vitals reviewed.  HENT:     Head:     Comments: Occipital laceration.  Likely about 2 cm Chest:     Chest wall: No tenderness.  Abdominal:     Tenderness: There is no abdominal tenderness.  Musculoskeletal:     Cervical back: Neck supple. No tenderness.     Comments: Mild tenderness to bilateral knees.  No hip tenderness.  Neurological:     Mental Status: He is alert.     (all labs ordered are listed, but only abnormal results are displayed) Labs Reviewed   BASIC METABOLIC PANEL WITH GFR - Abnormal; Notable for the following components:      Result Value   Glucose, Bld 103 (*)    Creatinine, Ser 1.29 (*)    GFR, Estimated 54 (*)    All other components within normal limits  CBC - Abnormal; Notable for the following components:   Platelets 145 (*)    All other components within normal limits    EKG: None  Radiology: CT HEAD WO CONTRAST ( ) Result Date: 10/11/2024 EXAM: CT HEAD AND CERVICAL SPINE 10/11/2024 01:24:00 PM TECHNIQUE: CT of the head and cervical spine was performed without the administration of intravenous contrast. Multiplanar reformatted images are provided for review. Automated exposure control, iterative reconstruction, and/or weight based adjustment of the mA/kV was utilized to reduce the radiation dose to as low as reasonably achievable. COMPARISON: CT scan 10/27/2020 and MRI brain 08/28/2023. CLINICAL HISTORY: Neck trauma (Age >= 65y) FINDINGS: CT HEAD BRAIN AND VENTRICLES: Overall stable age-related cerebral atrophy, ventriculomegaly, and periventricular white matter disease. No acute intracranial process such as hemispheric infarction or intracranial hemorrhage. No mass lesions. The brainstem and cerebellum are grossly normal and stable. No abnormal extra-axial fluid collection. ORBITS: No acute abnormality. The globes are intact. SINUSES AND MASTOIDS: The paranasal sinuses are grossly clear. There is scattered mucoperiosteal thickening involving the ethmoid air cells. SOFT TISSUES AND SKULL: No acute skull fracture or bone lesion. No scalp lesions or scalp hematoma. VASCULATURE: Stable age-related changes atherosclerotic calcifications but no aneurysm or hyperdense vessels. CT CERVICAL SPINE BONES AND ALIGNMENT: The overall alignment is maintained. Mild degenerative subluxations are noted at multiple levels. The facets are normally aligned. No acute fracture or traumatic malalignment. No acute cervical spine fracture.  DEGENERATIVE CHANGES: Degenerative cervical spondylosis with multilevel disc disease and facet disease. Monitor advanced degenerative changes. No large disc protrusions or canal stenosis. Mild multilevel foraminal stenosis due to uncinate spurring and facet disease. SOFT TISSUES: No prevertebral soft tissue swelling. The lung apices are grossly clear. No neck mass, adenopathy, or hematoma. IMPRESSION: 1. No acute intracranial abnormality. 2. No acute fracture or traumatic malalignment of the cervical spine. 3. Degenerative cervical spondylosis with multilevel disc and facet disease, including mild multilevel foraminal stenosis. Electronically signed by: Maude Stammer MD 10/11/2024 01:59 PM EST RP Workstation: [id number]   CT Cervical Spine Wo Contrast Result Date: 10/11/2024 EXAM: CT HEAD AND CERVICAL SPINE 10/11/2024 01:24:00 PM TECHNIQUE: CT of the head and cervical spine was performed without the administration of intravenous contrast. Multiplanar reformatted images are provided for review. Automated exposure control, iterative reconstruction, and/or weight based adjustment of the mA/kV was utilized to reduce the radiation dose to as low as reasonably achievable. COMPARISON: CT scan  10/27/2020 and MRI brain 08/28/2023. CLINICAL HISTORY: Neck trauma (Age >= 65y) FINDINGS: CT HEAD BRAIN AND VENTRICLES: Overall stable age-related cerebral atrophy, ventriculomegaly, and periventricular white matter disease. No acute intracranial process such as hemispheric infarction or intracranial hemorrhage. No mass lesions. The brainstem and cerebellum are grossly normal and stable. No abnormal extra-axial fluid collection. ORBITS: No acute abnormality. The globes are intact. SINUSES AND MASTOIDS: The paranasal sinuses are grossly clear. There is scattered mucoperiosteal thickening involving the ethmoid air cells. SOFT TISSUES AND SKULL: No acute skull fracture or bone lesion. No scalp lesions or scalp hematoma. VASCULATURE:  Stable age-related changes atherosclerotic calcifications but no aneurysm or hyperdense vessels. CT CERVICAL SPINE BONES AND ALIGNMENT: The overall alignment is maintained. Mild degenerative subluxations are noted at multiple levels. The facets are normally aligned. No acute fracture or traumatic malalignment. No acute cervical spine fracture. DEGENERATIVE CHANGES: Degenerative cervical spondylosis with multilevel disc disease and facet disease. Monitor advanced degenerative changes. No large disc protrusions or canal stenosis. Mild multilevel foraminal stenosis due to uncinate spurring and facet disease. SOFT TISSUES: No prevertebral soft tissue swelling. The lung apices are grossly clear. No neck mass, adenopathy, or hematoma. IMPRESSION: 1. No acute intracranial abnormality. 2. No acute fracture or traumatic malalignment of the cervical spine. 3. Degenerative cervical spondylosis with multilevel disc and facet disease, including mild multilevel foraminal stenosis. Electronically signed by: Maude Stammer MD 10/11/2024 01:59 PM EST RP Workstation: [id number]   DG Pelvis Portable Result Date: 10/11/2024 EXAM: 1 or 2 VIEW(S) XRAY OF THE PELVIS 10/11/2024 12:50:00 PM COMPARISON: None available. CLINICAL HISTORY: fall FINDINGS: BONES AND JOINTS: Degenerative changes of visualized lower lumbar spine. Moderate degenerative changes of the right hip. No acute fracture. No focal osseous lesion. No joint dislocation. SOFT TISSUES: The soft tissues are unremarkable. IMPRESSION: 1. No acute osseous abnormality to the pelvis or hips. 2. Moderate right hip osteoarthritis. Electronically signed by: Norleen Boxer MD 10/11/2024 01:31 PM EST RP Workstation: [id number]   DG Knee Complete 4 Views Right Result Date: 10/11/2024 EXAM: 4 OR MORE VIEW(S) XRAY OF THE KNEE 10/11/2024 12:50:00 PM COMPARISON: None available. CLINICAL HISTORY: fall FINDINGS: BONES AND JOINTS: No acute fracture. No focal osseous lesion. No joint  dislocation. No significant joint effusion. Tricompartmental osteoarthritis with joint space narrowing and marginal osteophytes. SOFT TISSUES: Vascular calcifications noted. IMPRESSION: 1. No evidence of acute traumatic injury. Electronically signed by: Norman Gatlin MD 10/11/2024 01:31 PM EST RP Workstation: [id number]   DG Knee Complete 4 Views Left Result Date: 10/11/2024 EXAM: 4 VIEW(S) XRAY OF THE LEFT KNEE 10/11/2024 12:50:00 PM COMPARISON: None available. CLINICAL HISTORY: fall FINDINGS: BONES AND JOINTS: No acute fracture. No focal osseous lesion. No joint dislocation. No significant joint effusion. Moderate patellofemoral joint space narrowing with marginal spurring consistent with osteoarthritis. SOFT TISSUES: Atherosclerotic vascular calcifications. IMPRESSION: 1. No evidence of acute traumatic injury. 2. Moderate patellofemoral joint space narrowing with marginal spurring consistent with osteoarthritis. Electronically signed by: Norman Gatlin MD 10/11/2024 01:31 PM EST RP Workstation: [id number]     Procedures   Medications Ordered in the ED - No data to display                                  Medical Decision Making Amount and/or Complexity of Data Reviewed Labs: ordered. Radiology: ordered.   Patient with fall.  Does have some knee pain.  Also occipital wound.  Laceration will likely need closing.  However will get head CT and cervical spine CT.  I think overall low risk of cervical spine injury and collar removed although will need imaging.  Does not appear to have thoracic or abdominal injury.  Creatinine just mildly increased.  CT scans reassuring.  X-rays reassuring.  Will have wound closed.  Should be able to discharge after.     Final diagnoses:  Fall, initial encounter  Laceration of scalp, initial encounter    ED Discharge Orders     None          Patsey Lot, MD 10/11/24 1515

## 2024-10-11 NOTE — ED Notes (Signed)
 Pt and wife verbalized understanding of discharge instructions. Opportunity for questions provided.

## 2024-10-11 NOTE — ED Provider Notes (Signed)
 Procedure indication: Approximately 2 cm superficial laceration on medial occipital lobe, hemostatic   Discussed risks/benefits of procedure, and patient gave consent for staple placement.  Wound cleansed with sterile saline and gauze.  Minimal pain at the time of exam, patient agreeable to staple placement without lidocaine  numbing.  Wound edges approximated appropriately with placement of 4 staples.  Patient tolerated the procedure well with mild pain with stable placement.  Placed gauze and tape over affected area given anticipated transport.  Recommend staple removal in about 7 days.   Theophilus Pagan, MD 10/11/2024, 3:48 PM PGY-3, [facility] [place] [facility] pager [phone]    Theophilus Pagan, MD 10/11/24 1548    Patt Alm Macho, MD 10/11/24 [phone]

## 2024-10-11 NOTE — ED Notes (Signed)
 ED Provider at bedside.

## 2024-10-11 NOTE — Discharge Instructions (Signed)
 The staples can come out in about a week.

## 2024-10-13 DIAGNOSIS — S0101XA Laceration without foreign body of scalp, initial encounter: Secondary | ICD-10-CM | POA: Diagnosis not present

## 2024-10-13 DIAGNOSIS — I10 Essential (primary) hypertension: Secondary | ICD-10-CM | POA: Diagnosis not present

## 2024-10-13 DIAGNOSIS — S0101XD Laceration without foreign body of scalp, subsequent encounter: Secondary | ICD-10-CM | POA: Diagnosis not present

## 2024-10-13 DIAGNOSIS — F32A Depression, unspecified: Secondary | ICD-10-CM | POA: Diagnosis not present

## 2024-10-17 ENCOUNTER — Other Ambulatory Visit: Payer: Self-pay | Admitting: *Deleted

## 2024-10-17 DIAGNOSIS — S0101XD Laceration without foreign body of scalp, subsequent encounter: Secondary | ICD-10-CM | POA: Diagnosis not present

## 2024-10-17 DIAGNOSIS — S0101XA Laceration without foreign body of scalp, initial encounter: Secondary | ICD-10-CM | POA: Diagnosis not present

## 2024-10-17 DIAGNOSIS — F32A Depression, unspecified: Secondary | ICD-10-CM | POA: Diagnosis not present

## 2024-10-17 DIAGNOSIS — I10 Essential (primary) hypertension: Secondary | ICD-10-CM | POA: Diagnosis not present

## 2024-10-17 NOTE — Patient Outreach (Signed)
   10/17/2024  Jared Clark May 22, 1936 [id number]  Jared Clark resides in [place] [facility] skilled nursing facility. [facility] skilled nursing waiver previously utilized for admission to [facility].   Previous update received from [employer] child psychotherapist. Jared Clark tested positive for COVID. Will return to ILF when appropriate.   Will continue to follow.    Pablo Hurst, MSN, RN, BSN [place]  [facility], [facility] RN Post- Acute Care Manager Direct Dial: [phone]

## 2024-10-28 ENCOUNTER — Other Ambulatory Visit: Payer: Self-pay | Admitting: *Deleted

## 2024-10-28 NOTE — Patient Outreach (Signed)
   10/28/2024  Jared Clark November 10, 1936 [id number]   [facility] SNF waiver previously utilized for admission to [facility] skilled nursing facility. Per [facility], Jared Clark discharged from [facility] SNF on 10/21/24.  Secure communication sent to [place] to inquire about home health arrangements.    Pablo Hurst, MSN, RN, BSN [place]  [facility], [facility] RN Care Manager Direct Dial: [phone]

## 2024-10-31 NOTE — Patient Outreach (Signed)
   10/31/2024  [place] JULIANNA [place] [birth year] [id number]   Verified with [place], [place] child psychotherapist. Mr. Escher returned to [facility] with his spouse on 10/21/24. Will have outpatient therapy.   No identifiable complex care management needs.   Pablo Hurst, MSN, RN, BSN [place]  [facility], [facility] RN Case Chartered Certified Accountant Dial: [phone]

## 2024-11-07 ENCOUNTER — Telehealth: Payer: Self-pay | Admitting: Cardiology

## 2024-11-07 ENCOUNTER — Ambulatory Visit: Attending: Cardiology

## 2024-11-07 DIAGNOSIS — R55 Syncope and collapse: Secondary | ICD-10-CM

## 2024-11-07 NOTE — Telephone Encounter (Signed)
 Pts PCP office requesting a zio monitor for the pt.

## 2024-11-07 NOTE — Telephone Encounter (Signed)
 Spoke with patient who states Dr. Orlando office called our office to request a Zio heart monitor for patient.  Patient states about a month ago he loss consciousness and fell.   Patient has an appt with APP on 12/16 to discuss syncope episode and he would like to have heart monitor completed by this time.  Will forward to Dr. Jordan to review and advise.  Patient states if any questions call Dr. Orlando office and ask for [facility].

## 2024-11-07 NOTE — Telephone Encounter (Signed)
 I called the patient and ordered the 2 week Zio monitor. I gave instructions on how to get the monitor on, purpose of it and how to send it back to the company. The patient demonstrated understanding and said he will reach out if he has any questions.

## 2024-11-07 NOTE — Progress Notes (Unsigned)
 Enrolled for [employer] to mail a ZIO XT long term holter monitor to the patients address on file.   Requested delivery date of 11/14/24.

## 2024-11-08 NOTE — Progress Notes (Deleted)
  Cardiology Office Note:  .   Date:  11/08/2024  ID:  ARTEZ REGIS, DOB Aug 30, 1936, MRN [id number] PCP: Loreli Elsie JONETTA Mickey., MD  [facility] Providers Cardiologist:  Peter Jordan, MD { Click to update primary MD,subspecialty MD or APP then REFRESH:1}   History of Present Illness: .   Jared Clark is a 88 y.o. male ***   Patient called in saying he lost consciousness and fell a month ago and PCP wanted a monitor. A zio was sent to his home.   ROS: ***  Studies Reviewed: SABRA         Prior CV Studies: {Select studies to display:26339}  ***  Risk Assessment/Calculations:   {Does this patient have ATRIAL FIBRILLATION?[phone]} No BP recorded.  {Refresh Note OR Click here to enter BP  :1}***       Physical Exam:   VS:  There were no vitals taken for this visit.   Orhtostatics: No data found. Wt Readings from Last 3 Encounters:  10/11/24 219 lb (99.3 kg)  07/20/24 219 lb 8 oz (99.6 kg)  05/16/24 221 lb (100.2 kg)    GEN: Well nourished, well developed in no acute distress NECK: No JVD; No carotid bruits CARDIAC: ***RRR, no murmurs, rubs, gallops RESPIRATORY:  Clear to auscultation without rales, wheezing or rhonchi  ABDOMEN: Soft, non-tender, non-distended EXTREMITIES:  No edema; No deformity   ASSESSMENT AND PLAN: .   ***     {Are you ordering a CV Procedure (e.g. stress test, cath, DCCV, TEE, etc)?   Press F2        :[id number]}  Dispo: ***  Signed, Olivia Pavy, PA-C

## 2024-11-22 ENCOUNTER — Ambulatory Visit: Admitting: Physician Assistant

## 2024-12-02 ENCOUNTER — Ambulatory Visit: Admitting: Emergency Medicine

## 2024-12-02 NOTE — Progress Notes (Deleted)
 " Cardiology Office Note:    Date:  12/02/2024  ID:  JAMIESON HETLAND, DOB 01/12/1936, MRN [id number] PCP: Loreli Elsie JONETTA Mickey., MD  [facility] Providers Cardiologist:  Peter Jordan, MD { Click to update primary MD,subspecialty MD or APP then REFRESH:1}    {Click to Open Review  :1}   Patient Profile:       Chief Complaint: *** History of Present Illness:  JOSHUWA VECCHIO is a 88 y.o. male with visit-pertinent history of coronary artery disease, hypertension, hyperlipidemia, GERD, carotid disease, obesity  In September 2003 he underwent stenting of the first obtuse marginal vessel.  Underwent repeat cardiac catheterization in March 2010 after abnormal stress test.  The cardiac catheterization showed long-term patency of the prior stent and nonobstructive disease elsewhere and he had a normal left ventricular function.  Was seen on 01/2014 for chest pain.  He underwent Lexiscan  Myoview  at that time that showed significant reversible anterolateral ischemia.  On 02/2014 he underwent cardiac catheterization with successful stenting of the second diagonal with a DES.  He was seen in September 2016 with complaints of atypical chest pains.  He underwent myocardial perfusion on 08/2015 which showed no ischemia and normal LVEF it was felt to be a low risk study.  He was seen once again in June 2017 for chest pains and was referred for cardiac catheterization which was unchanged.  He was seen in May 2018 due to chest pains.  Myoview  study showed a small inferior and apical area of ischemia and LVEF was 47%.  He underwent repeat cardiac catheterization which showed nonobstructive CAD that was unchanged from prior studies.  LV function was normal.  In June 2020 he presented with chest pains.  Cardiac catheterization completed on 05/2019 revealing nonobstructive CAD unchanged from previous cath back in May 2018 with normal LV function and LVEDP.  He was continued on his current medical management  including Plavix , statin therapy, beta-blocker, and ARB.  Ranexa  was then initiated for possible microvascular angina.  Last seen in clinic on 02/17/2024 by Dr. Jordan.  He was doing well without acute cardiovascular concerns.  No changes were made.  He was to follow-up in 1 year.  Discussed the use of AI scribe software for clinical note transcription with the patient, who gave verbal consent to proceed.  History of Present Illness     Review of systems:  Please see the history of present illness. All other systems are reviewed and otherwise negative. ***      Studies Reviewed:        ***  Risk Assessment/Calculations:   {Does this patient have ATRIAL FIBRILLATION?:[phone]} No BP recorded.  {Refresh Note OR Click here to enter BP  :1}***        Physical Exam:   VS:  There were no vitals taken for this visit.   Wt Readings from Last 3 Encounters:  10/11/24 219 lb (99.3 kg)  07/20/24 219 lb 8 oz (99.6 kg)  05/16/24 221 lb (100.2 kg)    GEN: Well nourished, well developed in no acute distress NECK: No JVD; No carotid bruits CARDIAC: ***RRR, no murmurs, rubs, gallops RESPIRATORY:  Clear to auscultation without rales, wheezing or rhonchi  ABDOMEN: Soft, non-tender, non-distended EXTREMITIES:  No edema; No acute deformity ***      Assessment and Plan:    Assessment and Plan Assessment & Plan      {Are you ordering a CV Procedure (e.g. stress test, cath, DCCV, TEE, etc)?  Press F2        :[id number]}  Dispo:  No follow-ups on file.  Signed, Lum LITTIE Louis, NP  "

## 2024-12-07 ENCOUNTER — Ambulatory Visit: Payer: Self-pay | Admitting: Cardiology

## 2024-12-07 DIAGNOSIS — R55 Syncope and collapse: Secondary | ICD-10-CM

## 2024-12-07 NOTE — Progress Notes (Signed)
 "    CARDIOLOGY OFFICE NOTE  Date:  12/13/2024    [place] JULIANNA Metro Date of Birth: 1936/03/24 Medical Record #[id number]  PCP:  Loreli Elsie JONETTA Mickey., MD  Cardiologist:  Trinika Cortese  MD  No chief complaint on file.   History of Present Illness: KUSHAL SAUNDERS is a 88 y.o. male who is seen for follow up CAD. Other issues include HTN, HLD, GERD, carotid disease and obesity.   In September of 2003 he underwent stenting of his first obtuse marginal vessel. He had a repeat cardiac catheterization in March 2010 after an abnormal stress Cardiolite . The cardiac catheterization showed long term patency of the prior stent and nonobstructive atherosclerotic disease elsewhere and he had normal left ventricular function.   Seen as a work in on 01/25/14 because of recurring chest pain. He had LexiScan  Myoview . This showed significant reversible anterolateral ischemia.  On 02/10/14 the patient underwent cardiac catheterization with successful stenting of the second diagonal with a drug-eluting stent. He was seen in September 2016 complaining of atypical chest discomfort. Because of his atypical chest pain the patient underwent a myocardial perfusion scan 08/2015 which showed no ischemia and normal EF. It was felt to be a low risk study.  Seen again in June 2017 with atypical chest pain and referred for cardiac cath which was unchanged.   Seen in May 2018 of chest pain. Myoview  study showed a small inferior and apical area of ischemia. EF lower at 47%. We recommended repeat cardiac cath. This showed nonobstructive CAD that was unchanged from prior studies. LV function was normal. EDP elevated 26-27 mm Hg.   In June 2020 he presented with chest pain. Cardiac catheterization was completed on 05/11/2019 revealing nonobstructive CAD unchanged from previous cath back in May 2018 with normal LV function and LVEDP.  He was to be continued on medical management with Plavix , statin therapy, beta-blocker, and ARB.  Ranexa  was  initiated at 500 mg twice daily for possible microvascular angina.   He is seen today with his wife. He is living at [employer]. He is very sedentary.  He  has been diagnosed with mild Alzheimers. Now on Namenda .  He reports some chest pain mainly in the left breast area but it migrates around to different areas. Only last 2-3 minutes. No dyspnea. Nothing sustained. Sometimes if he presses on it this will reproduce pain.  He was seen in the ED in November with a syncopal spell while preparing breakfast. Clemens and hit head. Laceration. Was confused after. Was diagnosed with Covid next day- wife thinks he had been sick for 3-4 days prior with cough. We performed event monitor which showed few brief runs of SVT otherwise rare ectopy. No recurrent syncope.   Past Medical History:  Diagnosis Date   Abnormal nuclear stress test 02/07/2014   Benign hypertensive heart disease without heart failure 08/22/2011   Benign prostate hyperplasia 03/19/2011   S/P TURP 1995   Bilateral carotid artery disease 08/29/2015   a. Carotid duplex in 05/2013: 40-59% RICA, <40% LICA. b. Per Patient, doppler 05/2015 showed 1-39% on the R - being folloewd by primary care.   Borderline diabetes    diet controlled   CAD S/P percutaneous coronary angioplasty 10/02/2015   Cataract of right eye    Chest pain with moderate risk for cardiac etiology 05/22/2016   Closed fracture of distal end of left fibula 10/21/2018   Coronary artery disease    a. 2003 s/p PCI/stenting; b. 02/2014 Abnl nuc ->  Cath/PCI: LM nl, LAD nl, D2 95p (2.5x12 Promus DES), LCX <20, OM1 patent stent, RCA 50d, EF 55-65%. c. 08/2015: normal nuc.   Depression    Dyslipidemia    Essential hypertension 10/02/2015   GERD (gastroesophageal reflux disease)    Herpes 11/22/2012   Hives 12/01/2017   Ischemic heart disease 03/19/2011   Treadmill Myoview  on 08/18/12 showed a possible small scar in the basal inferolateral wall but no reversible ischemia and his ejection  fraction was 57%     Major depressive disorder    Malaise and fatigue 03/19/2011   Mild dementia, concerns for Alzheimer's disease 08/21/2023   Osteoarthritis    right ankle, bilateral wrist, back   Sinus bradycardia    Syncope 12/01/2017    Past Surgical History:  Procedure Laterality Date   BACK SURGERY     CARDIAC CATHETERIZATION  02-06-09   NO INTERVENTION   CARDIAC CATHETERIZATION N/A 05/22/2016   Procedure: Left Heart Cath and Coronary Angiography;  Surgeon: Donis Kotowski M Byford Schools, MD;  Location: [facility] INVASIVE CV LAB;  Service: Cardiovascular;  Laterality: N/A;   CAROTID STENT     CERVICAL DISCECTOMY  1989   C4 - 5   CHOLECYSTECTOMY  2002   W/ UMBILICAL HERNIA REPAIR   CORONARY ANGIOPLASTY  2003- FIRST OBTUSE MARGIANL VESSEL    X1 CYPHER STENT   CORONARY STENT PLACEMENT     DES to first diagonal        DR Shanna Strength   EYE SURGERY     HERNIA REPAIR  2002   Umbilical   INGUINAL HERNIA REPAIR  1985   KNEE ARTHROSCOPY  11/05/2011   Procedure: ARTHROSCOPY KNEE;  Surgeon: Dempsey LULLA Moan;  Location: [facility];  Service: Orthopedics;  Laterality: Right;  RIGHT ARTHROSCOPY KNEE WITH DEBRIDEMENT, Chondroplasty   LEFT HEART CATH AND CORONARY ANGIOGRAPHY N/A 05/01/2017   Procedure: Left Heart Cath and Coronary Angiography;  Surgeon: Luan Urbani M, MD;  Location: [facility] INVASIVE CV LAB;  Service: Cardiovascular;  Laterality: N/A;   LEFT HEART CATH AND CORONARY ANGIOGRAPHY N/A 05/11/2019   Procedure: LEFT HEART CATH AND CORONARY ANGIOGRAPHY;  Surgeon: Lannette Avellino M, MD;  Location: [facility] INVASIVE CV LAB;  Service: Cardiovascular;  Laterality: N/A;   LEFT HEART CATHETERIZATION WITH CORONARY ANGIOGRAM N/A 02/10/2014   Procedure: LEFT HEART CATHETERIZATION WITH CORONARY ANGIOGRAM;  Surgeon: Cleburn Maiolo M Earnestene Angello, MD;  Location: [facility] CATH LAB;  Service: Cardiovascular;  Laterality: N/A;   LUMBAR LAMINECTOMY  1973   L4-S1   PILONIDAL CYST EXCISION  1961   SHOULDER ARTHROSCOPY     Bilateral    TONSILLECTOMY AND ADENOIDECTOMY  CHILD   TRANSURETHRAL RESECTION OF PROSTATE  1995     Medications: Current Outpatient Medications  Medication Sig Dispense Refill   atorvastatin  (LIPITOR ) 40 MG tablet TAKE 1 TABLET BY MOUTH DAILY 90 tablet 3   clopidogrel  (PLAVIX ) 75 MG tablet Take 75 mg by mouth daily.     hydrocortisone  (ANUSOL -HC) 2.5 % rectal cream Place 1 Application rectally 2 (two) times daily. 21 g 1   memantine  (NAMENDA ) 10 MG tablet Take 1 tablet twice a day 180 tablet 3   metoprolol  succinate (TOPROL -XL) 25 MG 24 hr tablet Take 12.5 mg by mouth daily.      nitroGLYCERIN  (NITROSTAT ) 0.4 MG SL tablet Place 0.4 mg under the tongue every 5 (five) minutes as needed for chest pain (x 3 tablets daily).     sertraline (ZOLOFT) 50 MG tablet Take 50 mg by mouth daily.  valsartan (DIOVAN) 160 MG tablet Take 160 mg by mouth daily.     vitamin B-12 (CYANOCOBALAMIN ) 500 MCG tablet Take 500 mcg by mouth daily.     Wheat Dextrin (BENEFIBER) POWD Use as directed, daily to twice a day     No current facility-administered medications for this visit.    Allergies: Allergies  Allergen Reactions   Flexeril [Cyclobenzaprine] Swelling   Methocarbamol Swelling   Orphenadrine Citrate Swelling   Lisinopril Cough   Ramipril  Other (See Comments)    DRY COUGH    Social History: The patient  reports that he has never smoked. He has never used smokeless tobacco. He reports current alcohol  use. He reports that he does not use drugs.   Family History: The patient's family history includes Hypertension in his mother; Lung cancer in his father; Memory loss in his maternal grandmother.   Review of Systems: Please see the history of present illness.   Otherwise, the review of systems is positive for none.   All other systems are reviewed and negative.   Physical Exam: VS:  BP 118/76   Pulse 60   Ht 6' 3 (1.905 m)   Wt 215 lb 9.6 oz (97.8 kg)   SpO2 94%   BMI 26.95 kg/m  .  BMI Body mass  index is 26.95 kg/m.  Wt Readings from Last 3 Encounters:  12/13/24 215 lb 9.6 oz (97.8 kg)  10/11/24 219 lb (99.3 kg)  07/20/24 219 lb 8 oz (99.6 kg)    GENERAL:  Well appearing WM in NAD HEENT:  PERRL, EOMI, sclera are clear. Oropharynx is clear. NECK:  No jugular venous distention, carotid upstroke brisk and symmetric, no bruits, no thyromegaly or adenopathy LUNGS:  Clear to auscultation bilaterally CHEST:  Unremarkable HEART:  RRR,  PMI not displaced or sustained,S1 and S2 within normal limits, no S3, no S4: no clicks, no rubs, no murmurs ABD:  Soft, nontender. BS +, no masses or bruits. No hepatomegaly, no splenomegaly EXT:  2 + pulses throughout, no edema, no cyanosis no clubbing SKIN:  Warm and dry.  No rashes NEURO:  Alert and oriented x 3. Cranial nerves II through XII intact. PSYCH:  Cognitively intact     LABORATORY DATA:  EKG Interpretation Date/Time:  Tuesday December 13 2024 11:05:07 EST Ventricular Rate:  60 PR Interval:  162 QRS Duration:  128 QT Interval:  430 QTC Calculation: 430 R Axis:   -24  Text Interpretation: Normal sinus rhythm Left ventricular hypertrophy with QRS widening and repolarization abnormality ( R in aVL , Cornell product , Romhilt-Estes ) When compared with ECG of 17-Feb-2024 15:16, QRS duration has decreased Minimal criteria for Septal infarct are no longer Present T wave inversion more evident in Lateral leads Confirmed by Adrien Shankar [phone]) on 12/13/2024 11:16:02 AM  EKG Interpretation Date/Time:  Tuesday December 13 2024 11:05:07 EST Ventricular Rate:  60 PR Interval:  162 QRS Duration:  128 QT Interval:  430 QTC Calculation: 430 R Axis:   -24  Text Interpretation: Normal sinus rhythm Left ventricular hypertrophy with QRS widening and repolarization abnormality ( R in aVL , Cornell product , Romhilt-Estes ) When compared with ECG of 17-Feb-2024 15:16, QRS duration has decreased Minimal criteria for Septal infarct are no longer  Present T wave inversion more evident in Lateral leads Confirmed by Neita Landrigan [phone]) on 12/13/2024 11:16:02 AM      Lab Results  Component Value Date   WBC 5.0 10/11/2024   HGB 14.1 10/11/2024  HCT 42.5 10/11/2024   PLT 145 (L) 10/11/2024   GLUCOSE 103 (H) 10/11/2024   CHOL 88 05/12/2019   TRIG 75 05/12/2019   HDL 34 (L) 05/12/2019   LDLCALC 39 05/12/2019   ALT 22 12/01/2017   AST 31 12/01/2017   NA 137 10/11/2024   K 4.2 10/11/2024   CL 105 10/11/2024   CREATININE 1.29 (H) 10/11/2024   BUN 16 10/11/2024   CO2 24 10/11/2024   INR 1.0 04/27/2017   Dated 06/17/18: cholesterol 97, triglycerides 62, HDL 43, LDL 42. A1c 5%. CBC, CMET and TSH normal.  Dated 06/21/19: cholesterol 108, triglycerides 63, HDL 43, LDL 52, A1c 4.8%. creatinine 1.2. otherwise CMET, CBC, TSH normal Dated 07/10/20: cholesterol 102, triglycerides 67, HDL 43, LDL 46. A1c 4.9%. creatinine 1.3. otherwise CMET and CBC normal. Dated 06/06/21: cholesterol 120, triglycerides 68, HDL 43, LDL 63, A1c 5%, CMET and TSH normal Dated 07/03/22: cholesterol 105, triglycerides 89, HDL 58, LDL 29. A1c 5%. Creatinine 1.3. otherwise CMET and TSH normal.  Dated 07/15/23: cholesterol 100, triglycerides 65, HDL 40, LDL 47. CMET and TSH normal Dated 08/11/24: cholesterol 98, triglycerides 63, HDL 44, LDL 41. A1c 5%.   BNP (last 3 results) No results for input(s): BNP in the last 8760 hours.  ProBNP (last 3 results) No results for input(s): PROBNP in the last 8760 hours.   Other Studies Reviewed Today:  Myoview : 04/23/17: Study Highlights     The left ventricular ejection fraction is mildly decreased (45-54%). Nuclear stress EF: 47%. There was no ST segment deviation noted during stress. Defect 1: There is a small defect of moderate severity present in the mid inferior and apical inferior location. Findings consistent with ischemia. This is an intermediate risk study.   There is small area mild severity reversible defect  in the mid and apical inferior walls (SDS =2). LVEF is mildly decreased at 47%   Cardiac cath: 05/01/17: Conclusion     Prox RCA lesion, 20 %stenosed. Dist RCA lesion, 50 %stenosed. 1st Mrg lesion, 0 %stenosed. 1st Diag lesion, 0 %stenosed. Ost 1st Mrg to 1st Mrg lesion, 20 %stenosed. Prox Cx to Mid Cx lesion, 20 %stenosed. The left ventricular systolic function is normal. LV end diastolic pressure is moderately elevated. The left ventricular ejection fraction is 55-65% by visual estimate.   1. Nonobstructive CAD 2. Normal LV function 3. Elevated LVEDP   Plan: compared to last angiogram there is no significant change. Will continue medical therapy and consider other causes of chest pain.      Cath: 05/11/19   Non-stenotic 1st Diag lesion was previously treated. Prox Cx to Mid Cx lesion is 20% stenosed. Non-stenotic 1st Mrg lesion was previously treated. Ost 1st Mrg to 1st Mrg lesion is 20% stenosed. Prox RCA lesion is 20% stenosed. Dist RCA lesion is 50% stenosed. The left ventricular systolic function is normal. LV end diastolic pressure is normal. The left ventricular ejection fraction is 55-65% by visual estimate.   1. Nonobstructive CAD- unchanged from May 2018 2. Normal LV function 3. Normal LVEDP   Plan: no new findings to explain his chest pain. Continue medical therapy  Echo 05/12/19: IMPRESSIONS      1. The left ventricle has normal systolic function with an ejection fraction of 60-65%. The cavity size was normal. There is moderate concentric left ventricular hypertrophy. Left ventricular diastolic Doppler parameters are consistent with impaired  relaxation.  2. The right ventricle has normal systolic function. The cavity was normal. There is no  increase in right ventricular wall thickness.  3. Mild thickening of the mitral valve leaflet.  4. The aortic valve is tricuspid. Moderate thickening of the aortic valve. Moderate calcification of the aortic valve. Aortic  valve regurgitation is mild by color flow Doppler. Mild stenosis of the aortic valve.d  Assessment/Plan: 1. CAD with first stent in the 1st OM back in 2003- [facility]; DES to 2nd DX in March 2015. Negative Myoview  from 08/2015.   Last cardiac cath in June 2020 was unchanged. No active angina.  Encourage increase in activity.   2. HTN - BP is well controlled on his current therapy. Contnue valsartan and low dose Toprol   3. HLD - on  statin. Excellent control. LDL 41  4. Carotid disease with  stable doppler study in March 2024 with 40-59% RICA disease. This hasn't changed in > 10 years  5. Obesity/deconditioning - encourage increased aerobic activity.  6. Syncope. Suspect related to Covid and some dehydration. Event monitor with only brief runs of SVT. No brady or pauses.   Follow up in one year.  Orders Placed This Encounter  Procedures   EKG 12-Lead    Signed: Laqueisha Catalina MD, [facility]  12/13/2024 11:28 AM  [facility]      "

## 2024-12-13 ENCOUNTER — Ambulatory Visit: Attending: Cardiology | Admitting: Cardiology

## 2024-12-13 ENCOUNTER — Encounter: Payer: Self-pay | Admitting: Cardiology

## 2024-12-13 VITALS — BP 118/76 | HR 60 | Ht 75.0 in | Wt 215.6 lb

## 2024-12-13 DIAGNOSIS — R55 Syncope and collapse: Secondary | ICD-10-CM | POA: Insufficient documentation

## 2024-12-13 DIAGNOSIS — I779 Disorder of arteries and arterioles, unspecified: Secondary | ICD-10-CM | POA: Insufficient documentation

## 2024-12-13 DIAGNOSIS — E7800 Pure hypercholesterolemia, unspecified: Secondary | ICD-10-CM | POA: Diagnosis not present

## 2024-12-13 DIAGNOSIS — I25119 Atherosclerotic heart disease of native coronary artery with unspecified angina pectoris: Secondary | ICD-10-CM | POA: Diagnosis not present

## 2024-12-13 DIAGNOSIS — I6521 Occlusion and stenosis of right carotid artery: Secondary | ICD-10-CM | POA: Insufficient documentation

## 2024-12-13 DIAGNOSIS — I10 Essential (primary) hypertension: Secondary | ICD-10-CM | POA: Insufficient documentation

## 2024-12-13 NOTE — Patient Instructions (Addendum)

## 2025-10-10 ENCOUNTER — Institutional Professional Consult (permissible substitution): Admitting: Psychology

## 2025-10-10 ENCOUNTER — Ambulatory Visit: Payer: Self-pay

## 2025-10-17 ENCOUNTER — Encounter: Payer: Self-pay | Admitting: Psychology
